# Patient Record
Sex: Female | Born: 1945 | Race: White | Hispanic: No | Marital: Married | Smoking: Former smoker
Health system: Southern US, Community
[De-identification: ages and names within clinical notes are randomized; demographics above are authoritative.]

## PROBLEM LIST (undated history)

## (undated) DIAGNOSIS — B019 Varicella without complication: Secondary | ICD-10-CM

## (undated) DIAGNOSIS — Z124 Encounter for screening for malignant neoplasm of cervix: Secondary | ICD-10-CM

## (undated) DIAGNOSIS — O039 Complete or unspecified spontaneous abortion without complication: Secondary | ICD-10-CM

## (undated) DIAGNOSIS — I10 Essential (primary) hypertension: Secondary | ICD-10-CM

## (undated) DIAGNOSIS — B353 Tinea pedis: Secondary | ICD-10-CM

## (undated) DIAGNOSIS — K219 Gastro-esophageal reflux disease without esophagitis: Secondary | ICD-10-CM

## (undated) DIAGNOSIS — M549 Dorsalgia, unspecified: Secondary | ICD-10-CM

## (undated) DIAGNOSIS — N952 Postmenopausal atrophic vaginitis: Secondary | ICD-10-CM

## (undated) DIAGNOSIS — E7800 Pure hypercholesterolemia, unspecified: Secondary | ICD-10-CM

## (undated) DIAGNOSIS — B351 Tinea unguium: Secondary | ICD-10-CM

## (undated) DIAGNOSIS — L309 Dermatitis, unspecified: Secondary | ICD-10-CM

## (undated) DIAGNOSIS — Z0000 Encounter for general adult medical examination without abnormal findings: Secondary | ICD-10-CM

## (undated) DIAGNOSIS — R05 Cough: Secondary | ICD-10-CM

## (undated) DIAGNOSIS — G4730 Sleep apnea, unspecified: Secondary | ICD-10-CM

## (undated) DIAGNOSIS — R350 Frequency of micturition: Secondary | ICD-10-CM

## (undated) DIAGNOSIS — G4733 Obstructive sleep apnea (adult) (pediatric): Principal | ICD-10-CM

## (undated) DIAGNOSIS — M8580 Other specified disorders of bone density and structure, unspecified site: Principal | ICD-10-CM

## (undated) HISTORY — DX: Postmenopausal atrophic vaginitis: N95.2

## (undated) HISTORY — DX: Pure hypercholesterolemia, unspecified: E78.00

## (undated) HISTORY — DX: Varicella without complication: B01.9

## (undated) HISTORY — DX: Encounter for screening for malignant neoplasm of cervix: Z12.4

## (undated) HISTORY — DX: Essential (primary) hypertension: I10

## (undated) HISTORY — DX: Dorsalgia, unspecified: M54.9

## (undated) HISTORY — DX: Complete or unspecified spontaneous abortion without complication: O03.9

## (undated) HISTORY — DX: Cough: R05

## (undated) HISTORY — DX: Tinea pedis: B35.3

## (undated) HISTORY — DX: Obstructive sleep apnea (adult) (pediatric): G47.33

## (undated) HISTORY — DX: Frequency of micturition: R35.0

## (undated) HISTORY — DX: Other specified disorders of bone density and structure, unspecified site: M85.80

## (undated) HISTORY — DX: Encounter for general adult medical examination without abnormal findings: Z00.00

## (undated) HISTORY — DX: Gastro-esophageal reflux disease without esophagitis: K21.9

## (undated) HISTORY — DX: Sleep apnea, unspecified: G47.30

## (undated) HISTORY — DX: Dermatitis, unspecified: L30.9

## (undated) HISTORY — DX: Tinea unguium: B35.1

---

## 1979-10-17 DIAGNOSIS — O039 Complete or unspecified spontaneous abortion without complication: Secondary | ICD-10-CM

## 1979-10-17 HISTORY — PX: DILATION AND CURETTAGE OF UTERUS: SHX78

## 1979-10-17 HISTORY — DX: Complete or unspecified spontaneous abortion without complication: O03.9

## 1998-09-16 ENCOUNTER — Other Ambulatory Visit: Admission: RE | Admit: 1998-09-16 | Discharge: 1998-09-16 | Payer: Self-pay | Admitting: Obstetrics and Gynecology

## 1999-10-17 DIAGNOSIS — M8580 Other specified disorders of bone density and structure, unspecified site: Secondary | ICD-10-CM

## 1999-10-17 HISTORY — DX: Other specified disorders of bone density and structure, unspecified site: M85.80

## 2000-07-02 ENCOUNTER — Other Ambulatory Visit: Admission: RE | Admit: 2000-07-02 | Discharge: 2000-07-02 | Payer: Self-pay | Admitting: Obstetrics and Gynecology

## 2001-08-06 ENCOUNTER — Other Ambulatory Visit: Admission: RE | Admit: 2001-08-06 | Discharge: 2001-08-06 | Payer: Self-pay | Admitting: Obstetrics and Gynecology

## 2002-10-27 ENCOUNTER — Other Ambulatory Visit: Admission: RE | Admit: 2002-10-27 | Discharge: 2002-10-27 | Payer: Self-pay | Admitting: Obstetrics and Gynecology

## 2003-12-11 ENCOUNTER — Other Ambulatory Visit: Admission: RE | Admit: 2003-12-11 | Discharge: 2003-12-11 | Payer: Self-pay | Admitting: Obstetrics and Gynecology

## 2004-10-28 ENCOUNTER — Other Ambulatory Visit: Admission: RE | Admit: 2004-10-28 | Discharge: 2004-10-28 | Payer: Self-pay | Admitting: Obstetrics and Gynecology

## 2004-11-24 ENCOUNTER — Encounter: Payer: Self-pay | Admitting: Family Medicine

## 2005-01-20 ENCOUNTER — Ambulatory Visit (HOSPITAL_COMMUNITY): Admission: RE | Admit: 2005-01-20 | Discharge: 2005-01-20 | Payer: Self-pay | Admitting: Gastroenterology

## 2005-11-23 ENCOUNTER — Other Ambulatory Visit: Admission: RE | Admit: 2005-11-23 | Discharge: 2005-11-23 | Payer: Self-pay | Admitting: Obstetrics & Gynecology

## 2007-02-01 ENCOUNTER — Other Ambulatory Visit: Admission: RE | Admit: 2007-02-01 | Discharge: 2007-02-01 | Payer: Self-pay | Admitting: Obstetrics & Gynecology

## 2008-03-16 LAB — HM MAMMOGRAPHY: HM Mammogram: NORMAL

## 2008-03-24 ENCOUNTER — Other Ambulatory Visit: Admission: RE | Admit: 2008-03-24 | Discharge: 2008-03-24 | Payer: Self-pay | Admitting: Obstetrics and Gynecology

## 2008-12-17 ENCOUNTER — Encounter: Payer: Self-pay | Admitting: Family Medicine

## 2009-01-11 ENCOUNTER — Ambulatory Visit: Payer: Self-pay | Admitting: Family Medicine

## 2009-01-11 DIAGNOSIS — J019 Acute sinusitis, unspecified: Secondary | ICD-10-CM | POA: Insufficient documentation

## 2009-01-12 ENCOUNTER — Telehealth: Payer: Self-pay | Admitting: Family Medicine

## 2009-04-01 ENCOUNTER — Encounter: Payer: Self-pay | Admitting: Family Medicine

## 2009-04-12 ENCOUNTER — Telehealth: Payer: Self-pay | Admitting: Family Medicine

## 2009-04-12 ENCOUNTER — Encounter: Payer: Self-pay | Admitting: Family Medicine

## 2009-08-16 ENCOUNTER — Encounter (INDEPENDENT_AMBULATORY_CARE_PROVIDER_SITE_OTHER): Payer: Self-pay | Admitting: *Deleted

## 2009-09-07 ENCOUNTER — Encounter: Payer: Self-pay | Admitting: *Deleted

## 2009-12-23 ENCOUNTER — Ambulatory Visit: Payer: Self-pay | Admitting: Family Medicine

## 2009-12-27 ENCOUNTER — Telehealth: Payer: Self-pay | Admitting: Family Medicine

## 2010-04-04 ENCOUNTER — Telehealth: Payer: Self-pay | Admitting: Family Medicine

## 2010-04-13 ENCOUNTER — Ambulatory Visit: Payer: Self-pay | Admitting: Family Medicine

## 2010-04-13 ENCOUNTER — Telehealth: Payer: Self-pay | Admitting: Family Medicine

## 2010-04-13 DIAGNOSIS — Z8619 Personal history of other infectious and parasitic diseases: Secondary | ICD-10-CM | POA: Insufficient documentation

## 2010-04-13 DIAGNOSIS — Z9189 Other specified personal risk factors, not elsewhere classified: Secondary | ICD-10-CM | POA: Insufficient documentation

## 2010-04-13 DIAGNOSIS — M6530 Trigger finger, unspecified finger: Secondary | ICD-10-CM | POA: Insufficient documentation

## 2010-04-13 DIAGNOSIS — M8580 Other specified disorders of bone density and structure, unspecified site: Secondary | ICD-10-CM | POA: Insufficient documentation

## 2010-04-13 DIAGNOSIS — I10 Essential (primary) hypertension: Secondary | ICD-10-CM

## 2010-04-13 DIAGNOSIS — E559 Vitamin D deficiency, unspecified: Secondary | ICD-10-CM | POA: Insufficient documentation

## 2010-04-13 DIAGNOSIS — G5600 Carpal tunnel syndrome, unspecified upper limb: Secondary | ICD-10-CM | POA: Insufficient documentation

## 2010-04-13 HISTORY — DX: Essential (primary) hypertension: I10

## 2010-04-13 LAB — CONVERTED CEMR LAB
Albumin: 4 g/dL (ref 3.5–5.2)
Basophils Absolute: 0 10*3/uL (ref 0.0–0.1)
Bilirubin, Direct: 0.2 mg/dL (ref 0.0–0.3)
Chloride: 108 meq/L (ref 96–112)
Cholesterol: 222 mg/dL — ABNORMAL HIGH (ref 0–200)
Direct LDL: 140.3 mg/dL
Eosinophils Absolute: 0.1 10*3/uL (ref 0.0–0.7)
Eosinophils Relative: 2.1 % (ref 0.0–5.0)
HDL: 54.3 mg/dL (ref >39.00)
MCHC: 33.8 g/dL (ref 30.0–36.0)
MCV: 96.5 fL (ref 78.0–100.0)
Monocytes Absolute: 0.4 10*3/uL (ref 0.1–1.0)
Neutrophils Relative %: 62.5 % (ref 43.0–77.0)
Phosphorus: 4.7 mg/dL — ABNORMAL HIGH (ref 2.3–4.6)
Platelets: 320 10*3/uL (ref 150.0–400.0)
Potassium: 6.3 meq/L (ref 3.5–5.1)
RDW: 14.7 % — ABNORMAL HIGH (ref 11.5–14.6)
Total Bilirubin: 0.8 mg/dL (ref 0.3–1.2)
Triglycerides: 71 mg/dL (ref 0.0–149.0)
Vit D, 25-Hydroxy: 57 ng/mL (ref 30–89)
WBC: 5.2 10*3/uL (ref 4.5–10.5)

## 2010-04-14 ENCOUNTER — Ambulatory Visit: Payer: Self-pay | Admitting: Family Medicine

## 2010-04-15 LAB — CONVERTED CEMR LAB: Potassium: 4.3 meq/L (ref 3.5–5.1)

## 2010-05-11 ENCOUNTER — Ambulatory Visit: Payer: Self-pay | Admitting: Family Medicine

## 2010-05-11 DIAGNOSIS — E782 Mixed hyperlipidemia: Secondary | ICD-10-CM | POA: Insufficient documentation

## 2010-05-25 ENCOUNTER — Encounter: Payer: Self-pay | Admitting: Family Medicine

## 2010-07-04 ENCOUNTER — Telehealth: Payer: Self-pay | Admitting: Family Medicine

## 2010-08-29 ENCOUNTER — Telehealth: Payer: Self-pay | Admitting: Family Medicine

## 2010-10-21 ENCOUNTER — Telehealth: Payer: Self-pay | Admitting: Family Medicine

## 2010-11-15 NOTE — Progress Notes (Signed)
Summary: Transfer care request  Phone Note Call from Patient Call back at Home Phone [phone]   Caller: Patient Call For: Evelena Peat MD Summary of Call: Pt calling to discuss her options at [facility].  Pt turned 64 and would like to transition from OB/GYN to a female PCP.  Pt loves Dr Caryl Never, however would feel more comfortable with a female for complete physicals.  Informed of new female provider here, Dr Abner Greenspan who is accepting new patients, however Dr Abner Greenspan will be moving to [facility] eventually.  Pt voiced her understanding and would like to proceed with proper transfer to Dr Abner Greenspan. Initial call taken by: Sid Falcon LPN,  April 04, 2010 10:33 AM  Follow-up for Phone Call        OK to transfer. Follow-up by: Evelena Peat MD,  April 04, 2010 12:16 PM  Additional Follow-up for Phone Call Additional follow up Details #1::        Please call pt to schedule appt type and date with Dr Abner Greenspan. Thank you Additional Follow-up by: Sid Falcon LPN,  April 04, 2010 2:56 PM    Additional Follow-up for Phone Call Additional follow up Details #2::    I called and lft a vm for pt to call back.   Follow-up by: Lucy Antigua,  April 04, 2010 3:36 PM  Additional Follow-up for Phone Call Additional follow up Details #3:: Details for Additional Follow-up Action Taken: Contacted pt and appt was scheduled with Dr Abner Greenspan on April 13, 2010 at 8:15am.... Pt will come in fasting for potential labwork... Pt understands this is an est appt with Dr Danise Edge.  Additional Follow-up by: Debbra Riding,  April 04, 2010 3:47 PM

## 2010-11-15 NOTE — Progress Notes (Signed)
Summary: Pt req refill of Vagifem 2 x week  Phone Note Call from Patient Call back at Home Phone [phone]   Refills Requested: Medication #1:  VAGIFEM 25 MCG TABS 2 X week Summary of Call: Pt called and is req refill of Vagifem 2 x week. Pls call in to [facility] [phone].  Pt says that the PA over at [facility], decrease dose from to .  Initial call taken by: Lucy Antigua,  July 04, 2010 10:14 AM  Follow-up for Phone Call        OK to refill use sig of pv twice weekly, Disp 1 # tube, 1 rf, needs women's health notes before I can rx more. She signed a release but it does not look like it is here yet. Follow-up by: Danise Edge MD,  July 04, 2010 12:29 PM    New/Updated Medications: VAGIFEM 10 MCG TABS (ESTRADIOL) take 1 twice a week Prescriptions: VAGIFEM 10 MCG TABS (ESTRADIOL) take 1 twice a week  #8 x 1   Entered by:   Josph Macho RMA   Authorized by:   Danise Edge MD   Signed by:   Josph Macho RMA on 07/04/2010   Method used:   Electronically to        [facility]* (retail)       [address]       [place], [place]  [id number]       Ph: [phone]       Fax: [phone]   RxID:   [id number]

## 2010-11-15 NOTE — Progress Notes (Signed)
Summary: Rx Vagifem  Phone Note Outgoing Call   Call placed by: Kathrynn Speed CMA,  April 13, 2010 2:16 PM Call placed to: Patient Summary of Call: Lft msg for pt to call office. Gwenn informed me of critical lab report of high K ( 6.3) informed Dr. Abner Greenspan she wants pt to drink lots of water & low K diet and follow up with in the morning to repeat the lab............................................................Marland Kitchensrc,cma..............04/13/10 ....................... Initial call taken by: Kathrynn Speed CMA,  April 13, 2010 2:21 PM  Follow-up for Phone Call        Pt is aware of lab results & will fu in the am to repeat the lab test.  Also, Pt want to make sure it is ok to continue useing Vagifem since she will not see an obgyn, if ok can you refill?  Follow-up by: Kathrynn Speed CMA,  April 13, 2010 3:29 PM  Additional Follow-up for Phone Call Additional follow up Details #1::        OK to cont Vagifem, rx sent to her pharmacy already Danise Edge MD  April 13, 2010 5:02 PM................Marland Kitchen        Additional Follow-up for Phone Call Additional follow up Details #2::    Lft msg that ok to continue med & rx sent to pharmacy Follow-up by: Kathrynn Speed CMA,  April 13, 2010 5:04 PM  Prescriptions: VAGIFEM 25 MCG TABS (ESTRADIOL) 2 X week  #8 x 3   Entered and Authorized by:   Danise Edge MD   Signed by:   Danise Edge MD on 04/13/2010   Method used:   Electronically to        [facility] (retail)       [address]       [place], [place]  [id number]       Ph: [phone]       Fax: [phone]   RxID:   [id number]

## 2010-11-15 NOTE — Progress Notes (Signed)
Summary: Vagifem refill  Phone Note Refill Request Message from:  Fax from Pharmacy on August 29, 2010 11:25 AM  Refills Requested: Medication #1:  VAGIFEM 10 MCG TABS take 1 twice a week   Dosage confirmed as above?Dosage Confirmed Please advise refill?  Initial call taken by: Josph Macho RMA,  August 29, 2010 11:25 AM  Follow-up for Phone Call        OK to refill with same sig, #8 and 1 rf Follow-up by: Danise Edge MD,  August 29, 2010 11:28 AM    Prescriptions: VAGIFEM 10 MCG TABS (ESTRADIOL) take 1 twice a week  #8 x 1   Entered by:   Josph Macho RMA   Authorized by:   Danise Edge MD   Signed by:   Josph Macho RMA on 08/29/2010   Method used:   Electronically to        [facility]* (retail)       [address]       [place], [place]  [id number]       Ph: [phone]       Fax: [phone]   RxID:   [id number]

## 2010-11-15 NOTE — Letter (Signed)
Summary: Records from [facility] 2009 - 2010  Records from [facility] 2009 - 2010   Imported By: Maryln Gottron 05/26/2010 09:40:45  _____________________________________________________________________  External Attachment:    Type:   Image     Comment:   External Document

## 2010-11-15 NOTE — Assessment & Plan Note (Signed)
Summary: Mandy [name] EST WITH DR Deniyah Dillavou - [place] PER DR Caryl Never // RS   Vital Signs:  Patient profile:   65 year old female Menstrual status:  postmenopausal Height:      62.5 inches Weight:      160 pounds BMI:     28.90 O2 Sat:      93 % on Room air Temp:     97.7 degrees F oral Pulse rate:   65 / minute BP sitting:   140 / 100  (left arm) Cuff size:   regular  Vitals Entered By: Kathrynn Speed CMA (April 13, 2010 8:16 AM)  O2 Flow:  Room air CC: Cpx / fasting / src     Menstrual Status postmenopausal   History of Present Illness: Patient in for new patient exam today doing very well. No recent illness or concerns. No CP/palp/SOB/GI or GU concerns. Does struggle with some pain in right hand at times but is doing well at present. 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  Preventive Screening-Counseling & Management  Caffeine-Diet-Exercise     Does Patient Exercise: yes      Drug Use:  no.    Current Medications (verified): 1)  Vagifem 25 Mcg Tabs (Estradiol) .... 2 X Week 2)  Calcium Carbonate 1500 Mg Tabs (Calcium Carbonate) .... Once A Day 3)  Sm Calcium Soft Chews 500-100-40 Chew (Calcium-Vitamin D-Vitamin K) .... Once A Day  Allergies (verified): 1)  ! Pneumovax 23 (Pneumococcal Vac Polyvalent) 2)  ! Codeine  Comments:  Nurse/Medical Assistant: The patient's medications were reviewed with the patient and were updated in the Medication List. Kathrynn Speed CMA (April 13, 2010 8:36 AM)  Past History:  Past Surgical History: D&C for incomplete miscarriage  Family History: Family History of Arthritis Family History High cholesterol Family History Hypertension Diabetes, parent Family History of Cardiovascular disorder Father: deceased @ 80, DMII, Heart Disease, Stroke Mother: deceased @95 , osteoporosis, CAD s/p MI in 81's, HTN Siblings:  Brother 77,  unknown history MGM: deceased @ 25, Dementia MGF: deceased @ 37, Asthma PGM: deceased @ 91, Ovarian CA PGF: deceased @70 , DMII, probable heart disease Children: Daughter: 92, [employer]  Social History: 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 Smoked Alcohol use-no Drug use-no Regular exercise-yes does Yoga 3-4 x wk, Systems analyst 1 x wk, and walks regularly Has lost 16 pounds by minimizing processed foods and meatsDrug Use:  no Does Patient Exercise:  yes  Review of Systems  The patient denies anorexia, fever, weight loss, weight gain, vision loss, decreased hearing, hoarseness, chest pain, syncope, dyspnea on exertion, peripheral edema, prolonged cough, headaches, hemoptysis, abdominal pain, melena, hematochezia, severe indigestion/heartburn, hematuria, incontinence, genital sores, muscle weakness, suspicious skin lesions, difficulty walking, depression, unusual weight change, and breast masses.    Physical Exam  General:  Well-developed,well-nourished,in no acute distress; alert,appropriate and cooperative throughout examination Head:  Normocephalic and atraumatic without obvious abnormalities Eyes:  No corneal or conjunctival inflammation noted. EOMI. Perrla.  Ears:  External ear exam shows no significant lesions or deformities.  Otoscopic examination reveals clear canals, tympanic membranes are intact bilaterally without bulging, retraction, inflammation or discharge. Hearing is grossly normal bilaterally. Nose:  External nasal examination shows no deformity or inflammation. Nasal mucosa are pink and moist without lesions or exudates. Mouth:  Oral mucosa and oropharynx without lesions or exudates.  Teeth in good repair. Neck:  No deformities, masses, or tenderness noted. Lungs:  Normal respiratory effort, chest expands symmetrically. Lungs are clear to auscultation, no crackles or wheezes. Heart:  Normal rate and regular rhythm. S1 and S2 normal without gallop, murmur, click, rub or  other extra sounds. Abdomen:  Bowel sounds positive,abdomen soft and non-tender without masses, organomegaly or hernias noted. Msk:  No deformity or scoliosis noted of thoracic or lumbar spine.   Pulses:  R and L carotid, dorsalis pedis and posterior tibial pulses are full and equal bilaterally Extremities:  No clubbing, cyanosis, edema, or deformity noted with normal full range of motion of all joints.   Neurologic:  No cranial nerve deficits noted. Station and gait are normal. Plantar reflexes are down-going bilaterally. DTRs are symmetrical throughout. Sensory, motor and coordinative functions appear intact. Skin:  Intact without suspicious lesions or rashes Cervical Nodes:  No lymphadenopathy noted Psych:  Cognition and judgment appear intact. Alert and cooperative with normal attention span and concentration. 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 Once a day    Sm Calcium Soft Chews 500-100-40 Chew (Calcium-vitamin d-vitamin k) ..... Once a day  Orders: TLB-Renal Function Panel (80069-RENAL) TLB-Lipid Panel (80061-LIPID) TLB-CBC Platelet - w/Differential (85025-CBCD) TLB-Hepatic/Liver Function Pnl (80076-HEPATIC) TLB-TSH (Thyroid Stimulating Hormone)  (84443-TSH) T-Vitamin D (25-Hydroxy) (60454-09811) Venipuncture (91478) Maintain Viactiv and Vitamin D until results available Consider repeat bone densitometry  Problem # 4:  PREVENTIVE HEALTH CARE (ICD-V70.0) Patient had Pap and MGM last year would like to wait til next year to repeat. Colonoscopy in 2008 was clear so need for repeat until 2018 unless symtpoms develop.  Complete Medication List: 1)  Vagifem 25 Mcg Tabs (Estradiol) .... 2 x week 2)  Calcium Carbonate 1500 Mg Tabs (Calcium carbonate) .... Once a day 3)  Sm Calcium Soft Chews 500-100-40 Chew (Calcium-vitamin d-vitamin k) .... Once a day  Patient Instructions: 1)  Please schedule a follow-up appointment in 1 month. Call with any concerns 2)  Continue Viactiv three times a day and we will notify you regarding your Vitamin D level  Prevention & Chronic Care Immunizations   Influenza vaccine: Not documented    Tetanus booster: Not documented    Pneumococcal vaccine: Pneumovax  (12/23/2009)    H. zoster vaccine: 09/23/2008: Zostavax  Colorectal Screening   Hemoccult: Not documented    Colonoscopy: normal  (10/16/2000)  Other Screening   Pap smear: Not documented    Mammogram: normal  (03/16/2008)    DXA bone density scan: Not documented   Smoking status: never  (01/11/2009)  Lipids   Total Cholesterol: Not documented   LDL: Not documented   LDL Direct: Not documented   HDL: Not documented   Triglycerides: Not documented

## 2010-11-15 NOTE — Progress Notes (Signed)
Summary: allergic reaction to pneumonia  Phone Note Call from Patient   Caller: Patient Call For: Evelena Peat MD Summary of Call: Pt. is complaining of allergic reaction at site of pneumonia immuninzation.  Red and painful whelp (5 inches in diameter). [phone] Initial call taken by: Lynann Beaver CMA,  December 27, 2009 10:23 AM  Follow-up for Phone Call        spoke with patient.  She will try Zyrtec and office visit to evaluate if not improving next couple of days.  She denies any fever or chills. Follow-up by: Evelena Peat MD,  December 27, 2009 12:49 PM   New Allergies: ! PNEUMOVAX 23 (PNEUMOCOCCAL VAC POLYVALENT) New Allergies: ! PNEUMOVAX 23 (PNEUMOCOCCAL VAC POLYVALENT)

## 2010-11-15 NOTE — Assessment & Plan Note (Signed)
Summary: PNEUMONIA INJ // RS  Nurse Visit   Immunizations Administered:  Pneumonia Vaccine:    Vaccine Type: Pneumovax    Site: left deltoid    Mfr: Merck    Dose: 0.5 ml    Route: IM    Given by: Sid Falcon LPN    Exp. Date: 02/03/2011    Lot #: [id number]  Orders Added: 1)  Pneumococcal Vaccine [90732] 2)  Admin 1st Vaccine [60454]

## 2010-11-15 NOTE — Letter (Signed)
Summary: Records from [facility] 2009 - 2010  Records from [facility] 2009 - 2010   Imported By: Maryln Gottron 05/19/2010 14:44:49  _____________________________________________________________________  External Attachment:    Type:   Image     Comment:   External Document

## 2010-11-15 NOTE — Assessment & Plan Note (Signed)
Summary: 1 month fup//ccm   Vital Signs:  Patient profile:   65 year old female Menstrual status:  postmenopausal Height:      62.5 inches (158.75 cm) Weight:      158 pounds (71.82 kg) O2 Sat:      96 % on Room air Temp:     97.8 degrees F (36.56 degrees C) oral Pulse rate:   71 / minute BP sitting:   122 / 82  (left arm) Cuff size:   regular  Vitals Entered By: Josph Macho RMA (May 11, 2010 9:23 AM)  O2 Flow:  Room air CC: 1 month follow up/ CF Is Patient Diabetic? No   History of Present Illness: Patient in today for f/u on labs and her initial appt. Doing very well, no recent illness, fevers, chills, CP, HA, malaise, SOB, GI or GU c/o. Her K was up with initial blood draw but normal on repeat, she denies any muscle cramping or palpitation.  Current Problems (verified): 1)  Mumps, Hx of  (ICD-V12.09) 2)  Chickenpox, Hx of  (ICD-V15.9) 3)  Trigger Finger, Right Thumb  (ICD-727.03) 4)  Carpal Tunnel Syndrome, Right, Mild  (ICD-354.0) 5)  Elevated Bp Reading Without Dx Hypertension  (ICD-796.2) 6)  Osteopenia  (ICD-733.90) 7)  Unspecified Vitamin D Deficiency  (ICD-268.9) 8)  Preventive Health Care  (ICD-V70.0) 9)  Facial Pain  (ICD-784.0)  Current Medications (verified): 1)  Vagifem 25 Mcg Tabs (Estradiol) .... 2 X Week 2)  Calcium Carbonate 1500 Mg Tabs (Calcium Carbonate) .... Once A Day 3)  Sm Calcium Soft Chews 500-100-40 Chew (Calcium-Vitamin D-Vitamin K) .... Once A Day  Allergies (verified): 1)  ! Pneumovax 23 (Pneumococcal Vac Polyvalent) 2)  ! Codeine  Past History:  Past Surgical History: Last updated: 04/13/2010 D&C for incomplete miscarriage  Family History: Last updated: 04/13/2010 Family History of Arthritis Family History High cholesterol Family History Hypertension Diabetes, parent Family History of Cardiovascular disorder Father: deceased @ 75, DMII, Heart Disease, Stroke Mother: deceased @95 , osteoporosis, CAD s/p MI in 71's,  HTN Siblings:  Brother 46, unknown history MGM: deceased @ 14, Dementia MGF: deceased @ 86, Asthma PGM: deceased @ 55, Ovarian CA PGF: deceased @70 , DMII, probable heart disease Children: Daughter: 43, [employer]  Social History: Last updated: 04/13/2010 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 Social history (including risk factors) reviewed for relevance to current acute and chronic problems.  Past Medical History: Reviewed history from 01/11/2009 and no changes required. Childbirth 68 D & C Miscarriage 36  Social History: Reviewed history from 04/13/2010 and no changes required. 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 EOMI. Perrla. Funduscopic exam benign, without hemorrhages, exudates or papilledema. Vision grossly normal. Mouth:  Oral mucosa and oropharynx without lesions or exudates.  Teeth in  good repair. Neck:  No deformities, masses, or tenderness noted. Lungs:  Normal respiratory effort, chest expands symmetrically. Lungs are clear to auscultation, no crackles or wheezes. Heart:  Normal rate and regular rhythm. S1 and S2 normal without gallop, murmur,  click, rub or other extra sounds. Abdomen:  Bowel sounds positive,abdomen soft and non-tender without masses, organomegaly or hernias noted. Msk:  No deformity or scoliosis noted of thoracic or lumbar spine.   Extremities:  No clubbing, cyanosis, edema, or deformity noted with normal full range of motion of all joints.   Psych:  Cognition and judgment appear intact. Alert and cooperative with normal attention span and concentration. No apparent delusions, illusions, hallucinations   Impression & Recommendations:  Problem # 1:  MIXED HYPERLIPIDEMIA (ICD-272.2) Avoid trans fats maintain daily exercise regimen, start a fish oil supplement daily and cont to monitor  Problem # 2:  ELEVATED BP READING WITHOUT DX HYPERTENSION (ICD-796.2) Improved on repeat today, avoid sodium and report any concerning numbers or symptoms  Problem # 3:  OSTEOPENIA (ICD-733.90)  Her updated medication list for this problem includes:    Calcium Carbonate 1500 Mg Tabs (Calcium carbonate) ..... Once a day    Sm Calcium Soft Chews 500-100-40 Chew (Calcium-vitamin d-vitamin k) ..... Once a day Request records from previous doctors she believes her last bone study was 6/10, cont calcium supplement and repeat bone study when due  Problem # 4:  PREVENTIVE HEALTH CARE (ICD-V70.0) MGM ordered today baseline EKG today Td given reports taking Zostavax and Pneumovax in last couple of years, will request records  Complete Medication List: 1)  Vagifem 25 Mcg Tabs (Estradiol) .... 2 x week 2)  Calcium Carbonate 1500 Mg Tabs (Calcium carbonate) .... Once a day 3)  Sm Calcium Soft Chews 500-100-40 Chew (Calcium-vitamin d-vitamin k) .... Once a day  Patient Instructions: 1)  Please schedule a follow-up appointment in 1 year.  2)  Please return for lab work one(1) week before your next appointment.  3)  BMP prior to visit, ICD-9: 278.02 4)  Hepatic Panel prior to visit ICD-9:  5)  Lipid panel prior to visit ICD-9 :272.0    6)  TSH prior to visit ICD-9 : 278.02 7)  CBC w/ Diff prior to visit ICD-9 : 796. 8)  Vitamin D level:733.92 9)  Release of Records: Mauricia Area, NP at [facility], need MGMs, Paps, colonoscopy and Bone Density results 10)  [facility] need labs and immunizations  Appended Document: Orders Update    Clinical Lists Changes  Orders: Added new Service order of EKG w/ Interpretation (93000) - Signed Added new Service order of TD Toxoids IM 7 YR + (57322) - Signed Added new Service order of Admin 1st Vaccine (02542) - Signed Observations: Added new observation of TD BOOST VIS: 09/03/07 version given May 11, 2010. (05/11/2010 10:08) Added new observation of TD BOOSTERLO: [id number] (05/11/2010 10:08) Added new observation of TD BOOST EXP: 11/11/2011 (05/11/2010 10:08) Added new observation of TD BOOSTERBY: Josph Macho RMA (05/11/2010 10:08) Added new observation of TD BOOSTERRT: IM (05/11/2010 10:08) Added new observation of TDBOOSTERDSE: 0.5 ml (05/11/2010 10:08) Added new observation of TD BOOSTERMF: Sanofi Pasteur (05/11/2010 10:08) Added new observation of TD BOOST SIT: left deltoid (05/11/2010 10:08) Added new observation of TD BOOSTER: Td (05/11/2010 10:08)       Immunizations Administered:  Tetanus Vaccine:    Vaccine Type: Td    Site: left deltoid    Mfr: Sanofi Pasteur    Dose: 0.5  ml    Route: IM    Given by: Josph Macho RMA    Exp. Date: 11/11/2011    Lot #: [id number]    VIS given: 09/03/07 version given May 11, 2010.

## 2010-11-17 NOTE — Progress Notes (Signed)
Summary: Vagifem refill  Phone Note Refill Request Message from:  Fax from Pharmacy on October 21, 2010 4:33 PM  Refills Requested: Medication #1:  VAGIFEM 10 MCG TABS take 1 twice a week   Dosage confirmed as above?Dosage Confirmed This med needs to be sent to [facility]  Initial call taken by: Josph Macho RMA,  October 21, 2010 4:33 PM    Prescriptions: VAGIFEM 10 MCG TABS (ESTRADIOL) take 1 twice a week  #3 x 3   Entered by:   Josph Macho RMA   Authorized by:   Danise Edge MD   Signed by:   Josph Macho RMA on 10/21/2010   Method used:   Faxed to ...       [facility] (mail-order)             , [place]         Ph: [phone]       Fax: [phone]   RxID:   [id number]

## 2011-03-03 NOTE — Op Note (Signed)
NAME:  Mandy [name], Mandy [name]                  ACCOUNT NO.:  [id number]   MEDICAL RECORD NO.:  [id number]          PATIENT TYPE:  AMB   LOCATION:  ENDO                         FACILITY:  [facility]   PHYSICIAN:  Anselmo Rod, M.D.  DATE OF BIRTH:  1945-10-23   DATE OF PROCEDURE:  01/20/2005  DATE OF DISCHARGE:                                 OPERATIVE REPORT   PROCEDURE PERFORMED:  Screening colonoscopy.   ENDOSCOPIST:  Anselmo Rod, M.D.   INSTRUMENT USED:  Olympus video colonoscope.   INDICATION FOR PROCEDURE:  A 65 year old white female underwent screening  colonoscopy, rule out colonic polyps, masses, etc.   PREPROCEDURE PREPARATION:  Informed consent was procured from the patient.  The patient was fasted for eight hours prior to the procedure and prepped  with a bottle of magnesium citrate and a gallon of GoLYTELY the night prior  to the procedure.  The risks and benefits of the procedure including a 10%  miss rate of cancer and polyps were discussed with the patient, as well.   PREPROCEDURE PHYSICAL:  The patient had stable vital signs.  Neck supple.  Chest clear to auscultation.  S1, S2 regular.  Abdomen soft with normal  bowel sounds.   DESCRIPTION OF PROCEDURE:  The patient was placed in the left lateral  decubitus position, sedated with 70 mg of Demerol and 10 mg of Versed in  slow incremental doses.  Once the patient was adequately sedated, and  maintained on low flow oxygen and continuous cardiac monitoring, the Olympus  video colonoscope was advanced from the rectum to the cecum. The appendical  orifice and ileocecal valve were clearly visualized and photographed.  The  terminal ileum appeared healthy and without lesions.  No masses, polyps,  erosions, ulcerations, or diverticula were seen.  Retroflexion of the rectum  revealed small internal hemorrhoids.  The patient tolerated the procedure  well without complications.   IMPRESSION:  1.  Small nonbleeding internal  hemorrhoids.  2. Otherwise normal colonoscopy      to the terminal ileum.   RECOMMENDATIONS:  1.  Continue a high fiber diet with liberal fluid intake.  2. Outpatient      follow-up as need arises in the future.  3. Repeat colonoscopy in the      next five years unless the patient develops any abnormal symptoms in the      interim.      JNM/MEDQ  D:  01/20/2005  T:  01/20/2005  Job:  [id number]   cc:   Andres Ege, M.D.  [address]  [place]  [place] [address]  Fax: [phone]   Teena Irani. Arlyce Dice, M.D.  [address]  [place]  [place] [address]  Fax: [phone]

## 2011-04-10 ENCOUNTER — Other Ambulatory Visit: Payer: Self-pay

## 2011-04-10 ENCOUNTER — Other Ambulatory Visit (INDEPENDENT_AMBULATORY_CARE_PROVIDER_SITE_OTHER): Payer: Medicare Other

## 2011-04-10 DIAGNOSIS — Z0000 Encounter for general adult medical examination without abnormal findings: Secondary | ICD-10-CM

## 2011-04-10 DIAGNOSIS — E7800 Pure hypercholesterolemia, unspecified: Secondary | ICD-10-CM

## 2011-04-10 DIAGNOSIS — E663 Overweight: Secondary | ICD-10-CM

## 2011-04-10 DIAGNOSIS — R030 Elevated blood-pressure reading, without diagnosis of hypertension: Secondary | ICD-10-CM

## 2011-04-10 LAB — CBC WITH DIFFERENTIAL/PLATELET
Basophils Relative: 1.5 % (ref 0.0–3.0)
Eosinophils Relative: 4.4 % (ref 0.0–5.0)
HCT: 39.2 % (ref 36.0–46.0)
Lymphs Abs: 1.3 10*3/uL (ref 0.7–4.0)
MCV: 96.4 fl (ref 78.0–100.0)
Monocytes Absolute: 0.4 10*3/uL (ref 0.1–1.0)
Monocytes Relative: 10.8 % (ref 3.0–12.0)
Neutrophils Relative %: 48.9 % (ref 43.0–77.0)
RBC: 4.07 Mil/uL (ref 3.87–5.11)
WBC: 3.7 10*3/uL — ABNORMAL LOW (ref 4.5–10.5)

## 2011-04-10 LAB — HEPATIC FUNCTION PANEL
ALT: 14 U/L (ref 0–35)
AST: 19 U/L (ref 0–37)
Bilirubin, Direct: 0.2 mg/dL (ref 0.0–0.3)
Total Bilirubin: 1.3 mg/dL — ABNORMAL HIGH (ref 0.3–1.2)
Total Protein: 6.3 g/dL (ref 6.0–8.3)

## 2011-04-10 LAB — BASIC METABOLIC PANEL
Chloride: 105 mEq/L (ref 96–112)
GFR: 58.91 mL/min — ABNORMAL LOW (ref >60.00)
Potassium: 4.5 mEq/L (ref 3.5–5.1)
Sodium: 140 mEq/L (ref 135–145)

## 2011-04-10 LAB — POCT URINALYSIS DIPSTICK
Ketones, UA: NEGATIVE
Protein, UA: NEGATIVE
Spec Grav, UA: 1
Urobilinogen, UA: 0.2
pH, UA: 7.5

## 2011-04-11 LAB — LIPID PANEL
Cholesterol: 257 mg/dL — ABNORMAL HIGH (ref 0–200)
VLDL: 12 mg/dL (ref 0.0–40.0)

## 2011-04-17 ENCOUNTER — Encounter: Payer: Self-pay | Admitting: Family Medicine

## 2011-04-20 ENCOUNTER — Encounter: Payer: Self-pay | Admitting: Family Medicine

## 2011-04-24 ENCOUNTER — Encounter: Payer: Self-pay | Admitting: Family Medicine

## 2011-04-26 ENCOUNTER — Ambulatory Visit (INDEPENDENT_AMBULATORY_CARE_PROVIDER_SITE_OTHER): Payer: Medicare Other | Admitting: Family Medicine

## 2011-04-26 ENCOUNTER — Encounter: Payer: Self-pay | Admitting: Family Medicine

## 2011-04-26 ENCOUNTER — Other Ambulatory Visit (HOSPITAL_COMMUNITY)
Admission: RE | Admit: 2011-04-26 | Discharge: 2011-04-26 | Disposition: A | Discharge status: Home / Self Care | Payer: Medicare Other | Source: Ambulatory Visit | Attending: Family Medicine | Admitting: Family Medicine

## 2011-04-26 ENCOUNTER — Other Ambulatory Visit: Payer: Self-pay

## 2011-04-26 ENCOUNTER — Telehealth: Payer: Self-pay

## 2011-04-26 DIAGNOSIS — L309 Dermatitis, unspecified: Secondary | ICD-10-CM | POA: Insufficient documentation

## 2011-04-26 DIAGNOSIS — M899 Disorder of bone, unspecified: Secondary | ICD-10-CM

## 2011-04-26 DIAGNOSIS — Z01419 Encounter for gynecological examination (general) (routine) without abnormal findings: Secondary | ICD-10-CM | POA: Insufficient documentation

## 2011-04-26 DIAGNOSIS — B351 Tinea unguium: Secondary | ICD-10-CM

## 2011-04-26 DIAGNOSIS — M8580 Other specified disorders of bone density and structure, unspecified site: Secondary | ICD-10-CM

## 2011-04-26 DIAGNOSIS — Z124 Encounter for screening for malignant neoplasm of cervix: Secondary | ICD-10-CM

## 2011-04-26 DIAGNOSIS — E785 Hyperlipidemia, unspecified: Secondary | ICD-10-CM

## 2011-04-26 DIAGNOSIS — M949 Disorder of cartilage, unspecified: Secondary | ICD-10-CM

## 2011-04-26 DIAGNOSIS — E559 Vitamin D deficiency, unspecified: Secondary | ICD-10-CM

## 2011-04-26 DIAGNOSIS — L259 Unspecified contact dermatitis, unspecified cause: Secondary | ICD-10-CM

## 2011-04-26 DIAGNOSIS — E782 Mixed hyperlipidemia: Secondary | ICD-10-CM

## 2011-04-26 DIAGNOSIS — R030 Elevated blood-pressure reading, without diagnosis of hypertension: Secondary | ICD-10-CM

## 2011-04-26 HISTORY — DX: Tinea unguium: B35.1

## 2011-04-26 HISTORY — DX: Encounter for screening for malignant neoplasm of cervix: Z12.4

## 2011-04-26 HISTORY — DX: Dermatitis, unspecified: L30.9

## 2011-04-26 MED ORDER — ESTRADIOL 10 MCG VA TABS: 1.0000 | ORAL_TABLET | VAGINAL | Status: DC

## 2011-04-26 MED ORDER — TRIAMCINOLONE ACETONIDE 0.1 % EX OINT: TOPICAL_OINTMENT | CUTANEOUS | Status: DC

## 2011-04-26 NOTE — Telephone Encounter (Signed)
Pt would also like an order for Vitamin D sent to [facility]?

## 2011-04-26 NOTE — Assessment & Plan Note (Signed)
Has not had a level checked since June 2011, will repeat level, patient will continue her Viactiv chews tid for now

## 2011-04-26 NOTE — Telephone Encounter (Signed)
Per md order was placed

## 2011-04-26 NOTE — Assessment & Plan Note (Signed)
B/l toenails, great toes are the worst. Soak nightly in distilled white vinegar and water and then coat with Vick's Vapor rub.

## 2011-04-26 NOTE — Assessment & Plan Note (Addendum)
Pap smear taken today, encouraged paps roughly every 3 years or as needed and MGMs every 2 years. Patient may titrate off Vagifem as tolerated at her discretion

## 2011-04-26 NOTE — Telephone Encounter (Signed)
Please notify patient the computer still is not letting me order the Vitamin D level. I have our computer team working on the problem and they promise it will be straight by Monday so she can get everything drawn at once

## 2011-04-26 NOTE — Assessment & Plan Note (Signed)
Improved control, will continue to monitor

## 2011-04-26 NOTE — Patient Instructions (Signed)

## 2011-04-26 NOTE — Assessment & Plan Note (Signed)
Patient total cholesterol is up so she is agreeing to have an NMR drawn and she will continue to try and avoid trans and saturated fats, Add Metamucil daily

## 2011-04-26 NOTE — Assessment & Plan Note (Signed)
Patient exercises regularly is encouraged to continue the same. Last Bone Densitometry was 2 years ago, encouraged to have a repeat some time in the next year

## 2011-04-26 NOTE — Assessment & Plan Note (Signed)
Hands, try Triamcinolone ointment to apply in thin coat to hands qhs and cover with moist cotton gloves

## 2011-04-26 NOTE — Progress Notes (Signed)
Mandy [name] [id number] 05/16/1946 04/26/2011      Progress Note New Patient  Subjective  Chief Complaint  Chief Complaint  Patient presents with  . Annual Exam    physical  . Gynecologic Exam    pap smear    HPI  Patient is a 65 yo Caucasian female in today for annual GYN exam. She is generally doing well. See review of systems for GYN history. She denies any GYN complaints at this time. Has not ever tried titrating off Vagifem but is willing to try now. Is concerned about dry cracked hands. She works in a garden a lot has tried applying Vaseline and gloves at night but that has not been helpful. He can become painful at times. Number warmth and redness associated. Just with thickened toenails. In the past she's had trouble onychomycosis and actually took a course of Lamisil which did help but took roughly 6 months. She denies any recent illness, fevers, chills, chest pain, palpitations, shortness of breath, GI or GU complaints. She is exercising regularly and keeping active.  Past Medical History  Diagnosis Date  . Osteopenia 10/17/1999  . Chicken pox as a child  . Measles as a child  . Miscarriage 1981  . Screening for malignant neoplasm of the cervix 04/26/2011  . Dermatitis 04/26/2011  . Onychomycosis 04/26/2011    Past Surgical History  Procedure Date  . Dilation and curettage of uterus 1981    miscarriage    Family History  Problem Relation Age of Onset  . Osteoporosis Mother   . Coronary artery disease Mother   . Heart attack Mother 78  . Hypertension Mother   . Diabetes Father     type 2  . Heart disease Father   . Stroke Father   . Dementia Maternal Grandmother   . Asthma Maternal Grandfather   . Cancer Paternal Grandmother     ovarian  . Diabetes Paternal Grandfather     tyoe 2  . Hearing loss Paternal Grandfather   . Arthritis Other   . Hyperlipidemia Other   . Hypertension Other   . Other Other     Cardiovascular disorder    History   Social  History  . Marital Status: Married    Spouse Name: N/A    Number of Children: N/A  . Years of Education: N/A   Occupational History  . bookkeeper    Social History Main Topics  . Smoking status: Former Smoker -- 1.0 packs/day    Types: Cigarettes    Quit date: 10/16/2000  . Smokeless tobacco: Never Used  . Alcohol Use: No  . Drug Use: No  . Sexually Active: Yes -- Female partner(s)   Other Topics Concern  . Not on file   Social History Narrative   Regular exercise- yesDoes Yoga 3-4 X week, personal trainer 1 X week, and walks regularlyHas lost 16 pounds by minimizing processed foods and meats    Current Outpatient Prescriptions on File Prior to Visit  Medication Sig Dispense Refill  . Calcium-Vitamin D-Vitamin K (CALCIUM SOFT CHEWS) 500-100-40 MG-UNT-MCG CHEW Chew 1 tablet by mouth daily.          Allergies  Allergen Reactions  . Codeine   . Pneumococcal Vaccine Polyvalent     REACTION: rash at injection site and induration.    Review of Systems  Review of Systems  Constitutional: Negative for fever, chills and malaise/fatigue.  HENT: Negative for hearing loss, nosebleeds and congestion.   Eyes: Negative for  discharge.  Respiratory: Negative for cough, sputum production, shortness of breath and wheezing.   Cardiovascular: Negative for chest pain, palpitations and leg swelling.  Gastrointestinal: Negative for heartburn, nausea, vomiting, abdominal pain, diarrhea, constipation and blood in stool.  Genitourinary: Negative for dysuria, urgency, frequency and hematuria.       G2P1 s/p 1 svd and 1 incomplete miscarriage and d&c. Menarche at 57 and menopause in mid to late 24s, had 1 episode of postmenopausal bleeding roughly 15 years ago, has not recurred, endometrial bx  Was negative  Musculoskeletal: Negative for myalgias, back pain and falls.  Skin: Negative for rash.       Dry cracked hands b/l hands from frequent hand washing has not responded to Petroleum jelly and  gloves. Thick, yellow toenails has had them in the past and responded to po Lamisil after 6 months  Neurological: Negative for dizziness, tremors, sensory change, focal weakness, loss of consciousness, weakness and headaches.  Endo/Heme/Allergies: Negative for polydipsia. Does not bruise/bleed easily.  Psychiatric/Behavioral: Negative for depression and suicidal ideas. The patient is not nervous/anxious and does not have insomnia.     Objective  BP 138/78  Pulse 53  Temp(Src) 97.6 F (36.4 C) (Oral)  Ht 5' 2.5" (1.588 m)  Wt 162 lb (73.483 kg)  BMI 29.16 kg/m2  SpO2 98%  Physical Exam  Physical Exam  Constitutional: She is oriented to person, place, and time and well-developed, well-nourished, and in no distress. No distress.  HENT:  Head: Normocephalic and atraumatic.  Right Ear: External ear normal.  Left Ear: External ear normal.  Nose: Nose normal.  Mouth/Throat: Oropharynx is clear and moist. No oropharyngeal exudate.  Eyes: Conjunctivae are normal. Pupils are equal, round, and reactive to light. Right eye exhibits no discharge. Left eye exhibits no discharge. No scleral icterus.  Neck: Normal range of motion. Neck supple. No thyromegaly present.  Cardiovascular: Normal rate, regular rhythm, normal heart sounds and intact distal pulses.   No murmur heard. Pulmonary/Chest: Effort normal and breath sounds normal. No respiratory distress. She has no wheezes. She has no rales.  Abdominal: Soft. Bowel sounds are normal. She exhibits no distension and no mass. There is no tenderness.  Genitourinary: Vagina normal, uterus normal, cervix normal, right adnexa normal and left adnexa normal. No vaginal discharge found.  Musculoskeletal: Normal range of motion. She exhibits no edema and no tenderness.  Lymphadenopathy:    She has no cervical adenopathy.  Neurological: She is alert and oriented to person, place, and time. She has normal reflexes. No cranial nerve deficit. Coordination  normal.  Skin: Skin is warm and dry. No rash noted. She is not diaphoretic.       Dry, cracked skin tips of fingers, no erythema or fluctuanc  Psychiatric: Mood, memory and affect normal.       Assessment & Plan  UNSPECIFIED VITAMIN D DEFICIENCY Has not had a level checked since June 2011, will repeat level, patient will continue her Viactiv chews tid for now  OSTEOPENIA Patient exercises regularly is encouraged to continue the same. Last Bone Densitometry was 2 years ago, encouraged to have a repeat some time in the next year  MIXED HYPERLIPIDEMIA Patient total cholesterol is up so she is agreeing to have an NMR drawn and she will continue to try and avoid trans and saturated fats, Add Metamucil daily  ELEVATED BP READING WITHOUT DX HYPERTENSION Improved control, will continue to monitor  Screening for malignant neoplasm of the cervix Pap smear taken today, encouraged  paps roughly every 3 years or as needed and MGMs every 2 years. Patient may titrate off Vagifem as tolerated at her discretion  Onychomycosis B/l toenails, great toes are the worst. Soak nightly in distilled white vinegar and water and then coat with Vick's Vapor rub.  Dermatitis Hands, try Triamcinolone ointment to apply in thin coat to hands qhs and cover with moist cotton gloves

## 2011-05-02 ENCOUNTER — Other Ambulatory Visit: Payer: Medicare Other

## 2011-05-02 DIAGNOSIS — E785 Hyperlipidemia, unspecified: Secondary | ICD-10-CM

## 2011-05-02 DIAGNOSIS — M8580 Other specified disorders of bone density and structure, unspecified site: Secondary | ICD-10-CM

## 2011-05-03 LAB — VITAMIN D 25 HYDROXY (VIT D DEFICIENCY, FRACTURES): Vit D, 25-Hydroxy: 61 ng/mL (ref 30–89)

## 2011-05-05 LAB — NMR LIPOPROFILE WITHOUT LIPIDS
HDL Particle Number: 25.9 umol/L — ABNORMAL LOW (ref >=30.5)
LP-IR Score: 11 (ref <=45)

## 2011-05-09 ENCOUNTER — Telehealth: Payer: Self-pay

## 2011-05-09 NOTE — Telephone Encounter (Signed)
Pt notified through result note.

## 2011-05-09 NOTE — Telephone Encounter (Signed)
Patient called and would like results of NMR testing

## 2011-06-06 ENCOUNTER — Telehealth: Payer: Self-pay

## 2011-06-06 NOTE — Telephone Encounter (Signed)
How much metamucil does pt take if using it to reduce cholesterol (pt is using wafers)? Please advise? Can leave a message on phone?

## 2011-06-06 NOTE — Telephone Encounter (Signed)
Informed pt that bone scan is still stable and still shows osteopenia. Also informed patient to start taking Citracal bid and include weight bearing exercise.

## 2011-06-06 NOTE — Telephone Encounter (Signed)
1 Dose daily (I believe a dose of the wafers is 2 of them, the package will day), we can increase to 2 doses down the line but we usually start with one

## 2011-06-06 NOTE — Telephone Encounter (Signed)
Patient informed. 

## 2011-06-13 ENCOUNTER — Encounter: Payer: Self-pay | Admitting: Family Medicine

## 2011-08-03 ENCOUNTER — Ambulatory Visit (INDEPENDENT_AMBULATORY_CARE_PROVIDER_SITE_OTHER): Payer: Medicare Other | Admitting: Family Medicine

## 2011-08-03 DIAGNOSIS — Z23 Encounter for immunization: Secondary | ICD-10-CM

## 2011-08-28 ENCOUNTER — Telehealth: Payer: Self-pay | Admitting: Family Medicine

## 2011-08-28 NOTE — Telephone Encounter (Signed)
Left a message for patient to return my call. 

## 2011-08-28 NOTE — Telephone Encounter (Signed)
Labs would be good, check Vitamin D, cbc, liver, renal, tsh, flp prior to visit

## 2011-08-28 NOTE — Telephone Encounter (Signed)
Pt is Medicare and has to be seen first.

## 2011-08-28 NOTE — Telephone Encounter (Signed)
Please advise 

## 2011-08-29 NOTE — Telephone Encounter (Signed)
Patient informed. 

## 2011-08-30 ENCOUNTER — Ambulatory Visit (INDEPENDENT_AMBULATORY_CARE_PROVIDER_SITE_OTHER): Payer: Medicare Other | Admitting: Family Medicine

## 2011-08-30 ENCOUNTER — Encounter: Payer: Self-pay | Admitting: Family Medicine

## 2011-08-30 DIAGNOSIS — R030 Elevated blood-pressure reading, without diagnosis of hypertension: Secondary | ICD-10-CM

## 2011-08-30 DIAGNOSIS — IMO0001 Reserved for inherently not codable concepts without codable children: Secondary | ICD-10-CM

## 2011-08-30 DIAGNOSIS — E782 Mixed hyperlipidemia: Secondary | ICD-10-CM

## 2011-08-30 DIAGNOSIS — E785 Hyperlipidemia, unspecified: Secondary | ICD-10-CM

## 2011-08-30 LAB — RENAL FUNCTION PANEL
BUN: 22 mg/dL (ref 6–23)
Chloride: 104 mEq/L (ref 96–112)
Creatinine, Ser: 0.9 mg/dL (ref 0.4–1.2)
GFR: 63.19 mL/min (ref >60.00)
Phosphorus: 4.3 mg/dL (ref 2.3–4.6)
Sodium: 140 mEq/L (ref 135–145)

## 2011-08-30 LAB — HEPATIC FUNCTION PANEL
ALT: 22 U/L (ref 0–35)
Albumin: 3.9 g/dL (ref 3.5–5.2)
Total Bilirubin: 0.9 mg/dL (ref 0.3–1.2)

## 2011-08-30 LAB — LIPID PANEL
Cholesterol: 236 mg/dL — ABNORMAL HIGH (ref 0–200)
HDL: 68.2 mg/dL (ref >39.00)

## 2011-08-30 LAB — LDL CHOLESTEROL, DIRECT: Direct LDL: 152.1 mg/dL

## 2011-08-30 NOTE — Patient Instructions (Signed)

## 2011-08-31 ENCOUNTER — Encounter: Payer: Self-pay | Admitting: Family Medicine

## 2011-08-31 NOTE — Assessment & Plan Note (Signed)
Improved on recheck, encouraged avoidance of sodium.

## 2011-08-31 NOTE — Progress Notes (Signed)
Mandy [name] [id number] 1946/02/19 08/31/2011      Progress Note-Follow Up  Subjective  Chief Complaint  Chief Complaint  Patient presents with  . Follow-up  . lab request    HPI  Patient is a 65 year old, Caucasian female who is in today for followup of multiple medical problems. She's been checking her blood sugar and see numbers in the 120s to 130s systolic. She reports she feels well. She denies any recent illness, fevers, chills, headache, chest pain palpitations, shortness of breath, GI or GU complaints. She has been trying to watch her transplant intake. She has switched to make a rent is taking vitamin B complex and some Metamucil 5 capsules daily. Exercise has been inconsistent.  Past Medical History  Diagnosis Date  . Osteopenia 10/17/1999  . Chicken pox as a child  . Measles as a child  . Miscarriage 1981  . Screening for malignant neoplasm of the cervix 04/26/2011  . Dermatitis 04/26/2011  . Onychomycosis 04/26/2011    Past Surgical History  Procedure Date  . Dilation and curettage of uterus 1981    miscarriage    Family History  Problem Relation Age of Onset  . Osteoporosis Mother   . Coronary artery disease Mother   . Heart attack Mother 78  . Hypertension Mother   . Diabetes Father     type 2  . Heart disease Father   . Stroke Father   . Dementia Maternal Grandmother   . Asthma Maternal Grandfather   . Cancer Paternal Grandmother     ovarian  . Diabetes Paternal Grandfather     tyoe 2  . Hearing loss Paternal Grandfather   . Arthritis Other   . Hyperlipidemia Other   . Hypertension Other   . Other Other     Cardiovascular disorder    History   Social History  . Marital Status: Married    Spouse Name: N/A    Number of Children: N/A  . Years of Education: N/A   Occupational History  . bookkeeper    Social History Main Topics  . Smoking status: Former Smoker -- 1.0 packs/day    Types: Cigarettes    Quit date: 10/16/2000  . Smokeless  tobacco: Never Used  . Alcohol Use: No  . Drug Use: No  . Sexually Active: Yes -- Female partner(s)   Other Topics Concern  . Not on file   Social History Narrative   Regular exercise- yesDoes Yoga 3-4 X week, personal trainer 1 X week, and walks regularlyHas lost 16 pounds by minimizing processed foods and meats    Current Outpatient Prescriptions on File Prior to Visit  Medication Sig Dispense Refill  . calcium citrate-vitamin D (CITRACAL+D) 315-200 MG-UNIT per tablet Take 1 tablet by mouth 2 (two) times daily.        . Estradiol (VAGIFEM) 10 MCG TABS Place 1 tablet (10 mcg total) vaginally 2 (two) times a week.  24 tablet  1  . fish oil-omega-3 fatty acids 1000 MG capsule Take 1 g by mouth daily.        . multivitamin (THERAGRAN) per tablet Take 1 tablet by mouth daily.        Marland Kitchen triamcinolone (KENALOG) 0.1 % ointment Apply to hands qhs prn dermatitis and place  On damp gloves  30 g  2    Allergies  Allergen Reactions  . Codeine   . Pneumococcal Vaccine Polyvalent     REACTION: rash at injection site and induration.  Review of Systems  Review of Systems  Constitutional: Negative for fever and malaise/fatigue.  HENT: Negative for congestion.   Eyes: Negative for discharge.  Respiratory: Negative for shortness of breath.   Cardiovascular: Negative for chest pain, palpitations and leg swelling.  Gastrointestinal: Negative for nausea, abdominal pain and diarrhea.  Genitourinary: Negative for dysuria.  Musculoskeletal: Negative for falls.  Skin: Negative for rash.  Neurological: Negative for loss of consciousness and headaches.  Endo/Heme/Allergies: Negative for polydipsia.  Psychiatric/Behavioral: Negative for depression and suicidal ideas. The patient is not nervous/anxious and does not have insomnia.     Objective  BP 130/82  Pulse 62  Temp(Src) 97.7 F (36.5 C) (Oral)  Ht 5' 2.5" (1.588 m)  Wt 162 lb 1.9 oz (73.537 kg)  BMI 29.18 kg/m2  SpO2 98%  Physical  Exam  Physical Exam  Constitutional: She is oriented to person, place, and time and well-developed, well-nourished, and in no distress. No distress.  HENT:  Head: Normocephalic and atraumatic.  Eyes: Conjunctivae are normal.  Neck: Neck supple. No thyromegaly present.  Cardiovascular: Normal rate, regular rhythm and normal heart sounds.   No murmur heard. Pulmonary/Chest: Effort normal and breath sounds normal. She has no wheezes.  Abdominal: She exhibits no distension and no mass.  Musculoskeletal: She exhibits no edema.  Lymphadenopathy:    She has no cervical adenopathy.  Neurological: She is alert and oriented to person, place, and time.  Skin: Skin is warm and dry. No rash noted. She is not diaphoretic.  Psychiatric: Memory, affect and judgment normal.    Lab Results  Component Value Date   TSH 2.39 04/10/2011   Lab Results  Component Value Date   WBC 3.7* 04/10/2011   HGB 13.3 04/10/2011   HCT 39.2 04/10/2011   MCV 96.4 04/10/2011   PLT 309.0 04/10/2011   Lab Results  Component Value Date   CREATININE 0.9 08/30/2011   BUN 22 08/30/2011   NA 140 08/30/2011   K 4.9 08/30/2011   CL 104 08/30/2011   CO2 31 08/30/2011   Lab Results  Component Value Date   ALT 22 08/30/2011   AST 24 08/30/2011   ALKPHOS 44 08/30/2011   BILITOT 0.9 08/30/2011   Lab Results  Component Value Date   CHOL 236* 08/30/2011   Lab Results  Component Value Date   HDL 68.20 08/30/2011   No results found for this basename: [facility]   Lab Results  Component Value Date   TRIG 85.0 08/30/2011   Lab Results  Component Value Date   CHOLHDL 3 08/30/2011     Assessment & Plan  ELEVATED BP READING WITHOUT DX HYPERTENSION Improved on recheck, encouraged avoidance of sodium.   MIXED HYPERLIPIDEMIA Encouraged continue Megared and metamucil as well as vitamin B complex. Avoid trans fats and simple carbs and increase activity level

## 2011-08-31 NOTE — Assessment & Plan Note (Signed)
Encouraged continue Megared and metamucil as well as vitamin B complex. Avoid trans fats and simple carbs and increase activity level

## 2011-12-11 ENCOUNTER — Other Ambulatory Visit: Payer: Self-pay

## 2011-12-11 MED ORDER — ESTRADIOL 10 MCG VA TABS: 1.0000 | ORAL_TABLET | VAGINAL | Status: DC

## 2012-04-19 ENCOUNTER — Other Ambulatory Visit (INDEPENDENT_AMBULATORY_CARE_PROVIDER_SITE_OTHER): Payer: Medicare Other

## 2012-04-19 DIAGNOSIS — E785 Hyperlipidemia, unspecified: Secondary | ICD-10-CM

## 2012-04-19 DIAGNOSIS — E782 Mixed hyperlipidemia: Secondary | ICD-10-CM

## 2012-04-19 DIAGNOSIS — I10 Essential (primary) hypertension: Secondary | ICD-10-CM

## 2012-04-19 DIAGNOSIS — E559 Vitamin D deficiency, unspecified: Secondary | ICD-10-CM

## 2012-04-19 LAB — RENAL FUNCTION PANEL
CO2: 28 mEq/L (ref 19–32)
Chloride: 103 mEq/L (ref 96–112)
GFR: 64.66 mL/min (ref >60.00)
Potassium: 4.6 mEq/L (ref 3.5–5.1)
Sodium: 140 mEq/L (ref 135–145)

## 2012-04-19 LAB — CBC
MCHC: 32.8 g/dL (ref 30.0–36.0)
MCV: 97.2 fl (ref 78.0–100.0)
Platelets: 287 10*3/uL (ref 150.0–400.0)

## 2012-04-19 LAB — HEPATIC FUNCTION PANEL
AST: 19 U/L (ref 0–37)
Alkaline Phosphatase: 35 U/L — ABNORMAL LOW (ref 39–117)
Total Bilirubin: 0.7 mg/dL (ref 0.3–1.2)

## 2012-04-19 LAB — LIPID PANEL
Cholesterol: 249 mg/dL — ABNORMAL HIGH (ref 0–200)
HDL: 62.1 mg/dL (ref >39.00)
Triglycerides: 128 mg/dL (ref 0.0–149.0)
VLDL: 25.6 mg/dL (ref 0.0–40.0)

## 2012-04-19 LAB — TSH: TSH: 1.82 u[IU]/mL (ref 0.35–5.50)

## 2012-04-22 LAB — NMR LIPOPROFILE WITH LIPIDS
HDL Particle Number: 38 umol/L (ref >=30.5)
HDL-C: 68 mg/dL (ref >=40)
LDL Size: 21.4 nm (ref >20.5)
LP-IR Score: 19 (ref <=45)
Small LDL Particle Number: 588 nmol/L — ABNORMAL HIGH (ref <=527)

## 2012-04-25 ENCOUNTER — Encounter: Payer: Self-pay | Admitting: Family Medicine

## 2012-04-25 ENCOUNTER — Ambulatory Visit (INDEPENDENT_AMBULATORY_CARE_PROVIDER_SITE_OTHER): Payer: Medicare Other | Admitting: Family Medicine

## 2012-04-25 VITALS — BP 138/92 | HR 62 | Ht 62.25 in | Wt 167.0 lb

## 2012-04-25 DIAGNOSIS — E559 Vitamin D deficiency, unspecified: Secondary | ICD-10-CM

## 2012-04-25 DIAGNOSIS — Z124 Encounter for screening for malignant neoplasm of cervix: Secondary | ICD-10-CM

## 2012-04-25 DIAGNOSIS — E782 Mixed hyperlipidemia: Secondary | ICD-10-CM

## 2012-04-25 DIAGNOSIS — R030 Elevated blood-pressure reading, without diagnosis of hypertension: Secondary | ICD-10-CM

## 2012-04-25 DIAGNOSIS — E785 Hyperlipidemia, unspecified: Secondary | ICD-10-CM

## 2012-04-25 DIAGNOSIS — M949 Disorder of cartilage, unspecified: Secondary | ICD-10-CM

## 2012-04-25 DIAGNOSIS — M899 Disorder of bone, unspecified: Secondary | ICD-10-CM

## 2012-04-25 MED ORDER — KRILL OIL PO CAPS: ORAL_CAPSULE | ORAL | Status: DC

## 2012-04-25 NOTE — Assessment & Plan Note (Signed)
Avoid trans fats continue exercise and add Krill oil

## 2012-04-25 NOTE — Assessment & Plan Note (Signed)
Adequate control at today's visit, no new medications, given a handout on the dash diet

## 2012-04-25 NOTE — Assessment & Plan Note (Signed)
Had bone scan last year, does not need repeat this year. Continue Ca/Vit D and exercise

## 2012-04-25 NOTE — Assessment & Plan Note (Signed)
Well treated at this time, no changes to therapy

## 2012-04-25 NOTE — Progress Notes (Signed)
Patient ID: Mandy [name], female   DOB: 1946/05/10, 66 y.o.   MRN: [id number] CARLEI HUANG [id number] November 14, 1945 04/25/2012      Progress Note New Patient  Subjective  Chief Complaint  No chief complaint on file.   HPI  66 year old Caucasian female who is in today for annual exam. Overall she's doing well. She denies any recent illness, tripped to the ER, GI or GU complaints. She's not had any chest pain, shortness of breath, palpitations, congestion or headaches. Overall she is pleased with her state of health. She stays very active she does yoga several times a week and cardiovascular exercise rest of the week also works with Systems analyst once a week. She tries to eat a heart healthy diet and minimize saturated fats. She avoids transplants and simple carbohydrates. She notes since dropping her Vagifem to once a week she's had increasing hot flashes having roughly one a day. Did not have any trouble this with 2 doses weekly so is encouraged to restart biweekly dosing.  Past Medical History  Diagnosis Date  . Osteopenia 10/17/1999  . Chicken pox as a child  . Measles as a child  . Miscarriage 1981  . Screening for malignant neoplasm of the cervix 04/26/2011  . Dermatitis 04/26/2011  . Onychomycosis 04/26/2011    Past Surgical History  Procedure Date  . Dilation and curettage of uterus 1981    miscarriage    Family History  Problem Relation Age of Onset  . Osteoporosis Mother   . Coronary artery disease Mother   . Heart attack Mother 15  . Hypertension Mother   . Diabetes Father     type 2  . Heart disease Father   . Stroke Father   . Dementia Maternal Grandmother   . Asthma Maternal Grandfather   . Cancer Paternal Grandmother     ovarian  . Diabetes Paternal Grandfather     tyoe 2  . Hearing loss Paternal Grandfather   . Arthritis Other   . Hyperlipidemia Other   . Hypertension Other   . Other Other     Cardiovascular disorder    History   Social History  .  Marital Status: Married    Spouse Name: N/A    Number of Children: N/A  . Years of Education: N/A   Occupational History  . bookkeeper    Social History Main Topics  . Smoking status: Former Smoker -- 1.0 packs/day    Types: Cigarettes    Quit date: 10/16/2000  . Smokeless tobacco: Never Used  . Alcohol Use: No  . Drug Use: No  . Sexually Active: Yes -- Female partner(s)   Other Topics Concern  . Not on file   Social History Narrative   Regular exercise- yesDoes Yoga 3-4 X week, personal trainer 1 X week, and walks regularlyHas lost 16 pounds by minimizing processed foods and meats    Current Outpatient Prescriptions on File Prior to Visit  Medication Sig Dispense Refill  . calcium citrate-vitamin D (CITRACAL+D) 315-200 MG-UNIT per tablet Take 1 tablet by mouth 2 (two) times daily.        . Estradiol (VAGIFEM) 10 MCG TABS Place 1 tablet (10 mcg total) vaginally 2 (two) times a week.  24 tablet  1  . multivitamin (THERAGRAN) per tablet Take 1 tablet by mouth daily.        Marland Kitchen triamcinolone (KENALOG) 0.1 % ointment Apply to hands qhs prn dermatitis and place  On damp gloves  30 g  2    Allergies  Allergen Reactions  . Codeine   . Pneumococcal Vaccine Polyvalent     REACTION: rash at injection site and induration.    Review of Systems  Review of Systems  Constitutional: Negative for fever, chills and malaise/fatigue.  HENT: Negative for hearing loss, nosebleeds and congestion.   Eyes: Negative for discharge.  Respiratory: Negative for cough, sputum production, shortness of breath and wheezing.   Cardiovascular: Negative for chest pain, palpitations and leg swelling.  Gastrointestinal: Negative for heartburn, nausea, vomiting, abdominal pain, diarrhea, constipation and blood in stool.  Genitourinary: Negative for dysuria, urgency, frequency and hematuria.  Musculoskeletal: Negative for myalgias, back pain and falls.  Skin: Negative for rash.  Neurological: Negative for  dizziness, tremors, sensory change, focal weakness, loss of consciousness, weakness and headaches.  Endo/Heme/Allergies: Negative for polydipsia. Does not bruise/bleed easily.  Psychiatric/Behavioral: Negative for depression and suicidal ideas. The patient is not nervous/anxious and does not have insomnia.     Objective  BP 138/92  Pulse 62  Ht 5' 2.25" (1.581 m)  Wt 167 lb (75.751 kg)  BMI 30.30 kg/m2  SpO2 98%  Physical Exam  Physical Exam  Constitutional: She is oriented to person, place, and time and well-developed, well-nourished, and in no distress. No distress.  HENT:  Head: Normocephalic and atraumatic.  Right Ear: External ear normal.  Left Ear: External ear normal.  Mouth/Throat: No oropharyngeal exudate.  Eyes: Conjunctivae and EOM are normal. Pupils are equal, round, and reactive to light. Left eye exhibits no discharge. No scleral icterus.  Neck: No JVD present. No tracheal deviation present.  Cardiovascular: Normal rate, regular rhythm, normal heart sounds and intact distal pulses.   Pulmonary/Chest: No respiratory distress. She has no rales.  Abdominal: Soft. Bowel sounds are normal. She exhibits no distension and no mass. There is tenderness. There is no guarding.  Musculoskeletal: She exhibits no edema and no tenderness.  Lymphadenopathy:    She has no cervical adenopathy.  Neurological: She is alert and oriented to person, place, and time.  Skin: Skin is warm and dry. No rash noted. She is not diaphoretic. No erythema.  Psychiatric: Mood, memory, affect and judgment normal.       Assessment & Plan  UNSPECIFIED VITAMIN D DEFICIENCY Well treated at this time, no changes to therapy  ELEVATED BP READING WITHOUT DX HYPERTENSION Adequate control at today's visit, no new medications, given a handout on the dash diet  MIXED HYPERLIPIDEMIA Avoid trans fats continue exercise and add Krill oil  OSTEOPENIA Had bone scan last year, does not need repeat this  year. Continue Ca/Vit D and exercise  Screening for malignant neoplasm of the cervix Normal last year for repeat for 2 more years. Given rx to go get her MGM at [facility] repeated, Has been over 10 years since colonoscopy. Encouraged to repeat and she agrees has someone in [place] she would like to use, will forward [place] results

## 2012-04-25 NOTE — Assessment & Plan Note (Signed)
Normal last year for repeat for 2 more years. Given rx to go get her MGM at [facility] repeated, Has been over 10 years since colonoscopy. Encouraged to repeat and she agrees has someone in [place] she would like to use, will forward [place] results

## 2012-04-25 NOTE — Patient Instructions (Addendum)

## 2012-06-13 ENCOUNTER — Encounter: Payer: Self-pay | Admitting: Family Medicine

## 2012-07-01 ENCOUNTER — Telehealth: Payer: Self-pay

## 2012-07-01 MED ORDER — MUPIROCIN CALCIUM 2 % NA OINT: TOPICAL_OINTMENT | Freq: Every day | NASAL | Status: DC

## 2012-07-01 NOTE — Telephone Encounter (Signed)
Pt informed and RX sent to pharmacy  

## 2012-07-01 NOTE — Telephone Encounter (Signed)
Likely just an irritated vessel. Send her in a prescription for Bactroban ointment to apply to nares qhs x 10 days, Disp # 1 tube and no rf to promote healing if no improvement she should come in for examination and possible referral to ENT. If they become difficult to control or she has any other concerning symptoms she should come in sooner

## 2012-07-01 NOTE — Telephone Encounter (Signed)
Pt states that her left nostril has been bleeding about 5 times a week for 2-3 months, only at night. Pt states she can get it to stop after about 60 seconds. Pts husband was concerned and wanted pt to call md. Please advise?

## 2012-07-11 LAB — HM COLONOSCOPY

## 2012-07-31 ENCOUNTER — Ambulatory Visit (INDEPENDENT_AMBULATORY_CARE_PROVIDER_SITE_OTHER): Payer: Medicare Other

## 2012-07-31 DIAGNOSIS — Z23 Encounter for immunization: Secondary | ICD-10-CM

## 2012-08-12 ENCOUNTER — Other Ambulatory Visit: Payer: Self-pay

## 2012-08-12 MED ORDER — ESTRADIOL 10 MCG VA TABS: 1.0000 | ORAL_TABLET | VAGINAL | Status: DC

## 2012-09-27 ENCOUNTER — Other Ambulatory Visit: Payer: Self-pay

## 2012-09-27 DIAGNOSIS — L309 Dermatitis, unspecified: Secondary | ICD-10-CM

## 2012-09-27 MED ORDER — TRIAMCINOLONE ACETONIDE 0.1 % EX OINT: TOPICAL_OINTMENT | CUTANEOUS | Status: DC

## 2012-10-18 ENCOUNTER — Other Ambulatory Visit: Payer: Medicare Other

## 2012-10-25 ENCOUNTER — Ambulatory Visit: Payer: Medicare Other | Admitting: Family Medicine

## 2012-11-12 ENCOUNTER — Other Ambulatory Visit (INDEPENDENT_AMBULATORY_CARE_PROVIDER_SITE_OTHER): Payer: Medicare Other

## 2012-11-12 DIAGNOSIS — E785 Hyperlipidemia, unspecified: Secondary | ICD-10-CM

## 2012-11-12 DIAGNOSIS — R030 Elevated blood-pressure reading, without diagnosis of hypertension: Secondary | ICD-10-CM

## 2012-11-12 LAB — RENAL FUNCTION PANEL
Albumin: 3.9 g/dL (ref 3.5–5.2)
BUN: 19 mg/dL (ref 6–23)
Chloride: 102 mEq/L (ref 96–112)
Phosphorus: 4.2 mg/dL (ref 2.3–4.6)

## 2012-11-12 LAB — CBC
HCT: 39.3 % (ref 36.0–46.0)
Hemoglobin: 13 g/dL (ref 12.0–15.0)
Platelets: 299 10*3/uL (ref 150.0–400.0)
RBC: 4.07 Mil/uL (ref 3.87–5.11)
WBC: 5.1 10*3/uL (ref 4.5–10.5)

## 2012-11-12 LAB — HEPATIC FUNCTION PANEL
Alkaline Phosphatase: 42 U/L (ref 39–117)
Bilirubin, Direct: 0 mg/dL (ref 0.0–0.3)
Total Protein: 6.6 g/dL (ref 6.0–8.3)

## 2012-11-12 LAB — LIPID PANEL: VLDL: 18 mg/dL (ref 0.0–40.0)

## 2012-11-12 NOTE — Progress Notes (Signed)
Labs only

## 2012-11-13 LAB — NMR LIPOPROFILE WITH LIPIDS
Cholesterol, Total: 205 mg/dL — ABNORMAL HIGH (ref <200)
HDL Particle Number: 34.5 umol/L (ref >=30.5)
HDL-C: 55 mg/dL (ref >=40)
LDL Size: 21.5 nm (ref >20.5)
Large HDL-P: 10.1 umol/L (ref >=4.8)
Triglycerides: 90 mg/dL (ref <150)

## 2012-11-18 ENCOUNTER — Ambulatory Visit (INDEPENDENT_AMBULATORY_CARE_PROVIDER_SITE_OTHER): Payer: Medicare Other | Admitting: Family Medicine

## 2012-11-18 ENCOUNTER — Encounter: Payer: Self-pay | Admitting: Family Medicine

## 2012-11-18 ENCOUNTER — Ambulatory Visit: Payer: Medicare Other | Admitting: Family Medicine

## 2012-11-18 VITALS — BP 162/94 | HR 75 | Temp 97.5°F | Ht 62.5 in | Wt 144.0 lb

## 2012-11-18 DIAGNOSIS — N952 Postmenopausal atrophic vaginitis: Secondary | ICD-10-CM

## 2012-11-18 DIAGNOSIS — E782 Mixed hyperlipidemia: Secondary | ICD-10-CM

## 2012-11-18 DIAGNOSIS — R030 Elevated blood-pressure reading, without diagnosis of hypertension: Secondary | ICD-10-CM

## 2012-11-18 HISTORY — DX: Postmenopausal atrophic vaginitis: N95.2

## 2012-11-18 MED ORDER — ESTROGENS, CONJUGATED 0.625 MG/GM VA CREA: TOPICAL_CREAM | VAGINAL | Status: DC

## 2012-11-18 NOTE — Progress Notes (Signed)
Patient ID: Mandy [name], female   DOB: [birth year], 67 y.o.   MRN: [id number] Mandy [name] [id number] [birth year] 11/18/2012      Progress Note-Follow Up  Subjective  Chief Complaint  Chief Complaint  Patient presents with  . Follow-up    6 month    HPI  Patient is a 67 yo Caucasian female who is in today for a followup. She feels well. No recent illness, fevers, chills, headache, chest pain, palpitations, shortness of breath, GI or GU complaints. She has been exercising regularly and eating a heart healthy diet. Has had good weight loss. She was at a [employer] party last night and acknowledges eating a great deal of sodium and having a lot of caffeine this morning. She also got very little sleep.  Past Medical History  Diagnosis Date  . Osteopenia 10/17/1999  . Chicken pox as a child  . Measles as a child  . Miscarriage 1981  . Screening for malignant neoplasm of the cervix 04/26/2011  . Dermatitis 04/26/2011  . Onychomycosis 04/26/2011  . Atrophic vaginitis 11/18/2012    Past Surgical History  Procedure Date  . Dilation and curettage of uterus 1981    miscarriage    Family History  Problem Relation Age of Onset  . Osteoporosis Mother   . Coronary artery disease Mother   . Heart attack Mother 47  . Hypertension Mother   . Diabetes Father     type 2  . Heart disease Father   . Stroke Father   . Dementia Maternal Grandmother   . Asthma Maternal Grandfather   . Cancer Paternal Grandmother     ovarian  . Diabetes Paternal Grandfather     tyoe 2  . Hearing loss Paternal Grandfather   . Arthritis Other   . Hyperlipidemia Other   . Hypertension Other   . Other Other     Cardiovascular disorder    History   Social History  . Marital Status: Married    Spouse Name: N/A    Number of Children: N/A  . Years of Education: N/A   Occupational History  . bookkeeper    Social History Main Topics  . Smoking status: Former Smoker -- 1.0 packs/day    Types: Cigarettes   Quit date: 10/16/2000  . Smokeless tobacco: Never Used  . Alcohol Use: No  . Drug Use: No  . Sexually Active: Yes -- Female partner(s)   Other Topics Concern  . Not on file   Social History Narrative   Regular exercise- yesDoes Yoga 3-4 X week, personal trainer 1 X week, and walks regularlyHas lost 16 pounds by minimizing processed foods and meats    Current Outpatient Prescriptions on File Prior to Visit  Medication Sig Dispense Refill  . b complex vitamins tablet Take 1 tablet by mouth daily.      . calcium citrate-vitamin D (CITRACAL+D) 315-200 MG-UNIT per tablet Take 1 tablet by mouth 2 (two) times daily.        [facility]ius CAPS 1 cap po qd MegaRed      . multivitamin (THERAGRAN) per tablet Take 1 tablet by mouth daily.        . psyllium (METAMUCIL) 0.52 G capsule Take 2.6 g by mouth daily. 5 capsules      . triamcinolone ointment (KENALOG) 0.1 % Apply to hands qhs prn dermatitis and place  On damp gloves  30 g  2  . vitamin C (ASCORBIC ACID) 500 MG tablet Take 500  mg by mouth daily.      . mupirocin nasal ointment (BACTROBAN) 2 % Place into the nose at bedtime. Use one-half of tube in each nostril twice daily for ten (10) days. After application, press sides of nose together and gently massage.  10 g  0    Allergies  Allergen Reactions  . Codeine   . Pneumococcal Vaccine Polyvalent     REACTION: rash at injection site and induration.    Review of Systems  Review of Systems  Constitutional: Negative for fever and malaise/fatigue.  HENT: Negative for congestion.   Eyes: Negative for discharge.  Respiratory: Negative for shortness of breath.   Cardiovascular: Negative for chest pain, palpitations and leg swelling.  Gastrointestinal: Negative for nausea, abdominal pain and diarrhea.  Genitourinary: Negative for dysuria.  Musculoskeletal: Negative for falls.  Skin: Negative for rash.  Neurological: Negative for loss of consciousness and headaches.  Endo/Heme/Allergies:  Negative for polydipsia.  Psychiatric/Behavioral: Negative for depression and suicidal ideas. The patient is not nervous/anxious and does not have insomnia.     Objective  BP 162/94  Pulse 75  Temp 97.5 F (36.4 C) (Temporal)  Ht 5' 2.5" (1.588 m)  Wt 144 lb (65.318 kg)  BMI 25.92 kg/m2  SpO2 95%  Physical Exam  Physical Exam  Constitutional: She is oriented to person, place, and time and well-developed, well-nourished, and in no distress. No distress.  HENT:  Head: Normocephalic and atraumatic.  Eyes: Conjunctivae normal are normal.  Neck: Neck supple. No thyromegaly present.  Cardiovascular: Normal rate, regular rhythm and normal heart sounds.   No murmur heard. Pulmonary/Chest: Effort normal and breath sounds normal. She has no wheezes.  Abdominal: She exhibits no distension and no mass.  Musculoskeletal: She exhibits no edema.  Lymphadenopathy:    She has no cervical adenopathy.  Neurological: She is alert and oriented to person, place, and time.  Skin: Skin is warm and dry. No rash noted. She is not diaphoretic.  Psychiatric: Memory, affect and judgment normal.    Lab Results  Component Value Date   TSH 1.49 11/12/2012   Lab Results  Component Value Date   WBC 5.1 11/12/2012   HGB 13.0 11/12/2012   HCT 39.3 11/12/2012   MCV 96.6 11/12/2012   PLT 299.0 11/12/2012   Lab Results  Component Value Date   CREATININE 0.8 11/12/2012   BUN 19 11/12/2012   NA 139 11/12/2012   K 4.5 11/12/2012   CL 102 11/12/2012   CO2 32 11/12/2012   Lab Results  Component Value Date   ALT 14 11/12/2012   AST 20 11/12/2012   ALKPHOS 42 11/12/2012   BILITOT 1.0 11/12/2012   Lab Results  Component Value Date   CHOL 206* 11/12/2012   Lab Results  Component Value Date   HDL 56.00 11/12/2012   Lab Results  Component Value Date   LDLCALC 132* 11/12/2012   Lab Results  Component Value Date   TRIG 90.0 11/12/2012   TRIG 90 11/12/2012   Lab Results  Component Value Date   CHOLHDL 4  11/12/2012     Assessment & Plan  ELEVATED BP READING WITHOUT DX HYPERTENSION Patient generally not noted to have such high blood pressure was at a [employer] party last night eating significant amounts of sodium. Had a good deal of caffeine this am as well. Encouraged to minimize sodium and heaviness next month and have a blood pressure check after that. Will need to start medications if it  remains elevated.  MIXED HYPERLIPIDEMIA Good improvement with weight loss and exercise encouraged to continue the same. Continue Krill oil  Atrophic vaginitis Incomplete response to Vagifem. We'll switch to Premarin cream 3 times weekly and then titrate down to once weekly as tolerated.

## 2012-11-18 NOTE — Assessment & Plan Note (Signed)
Good improvement with weight loss and exercise encouraged to continue the same. Continue Krill oil

## 2012-11-18 NOTE — Assessment & Plan Note (Signed)
Incomplete response to Vagifem. We'll switch to Premarin cream 3 times weekly and then titrate down to once weekly as tolerated.

## 2012-11-18 NOTE — Patient Instructions (Addendum)
Next visit in 1 month is nurse visit, BP check  6 month with MD for annual GYN with labs prior to visit, lipid, renal, cbc, tsh, hepatic  Watch the sodium and caffeine this next month

## 2012-11-18 NOTE — Assessment & Plan Note (Signed)
Patient generally not noted to have such high blood pressure was at a [employer] party last night eating significant amounts of sodium. Had a good deal of caffeine this am as well. Encouraged to minimize sodium and heaviness next month and have a blood pressure check after that. Will need to start medications if it remains elevated.

## 2012-12-19 ENCOUNTER — Ambulatory Visit (INDEPENDENT_AMBULATORY_CARE_PROVIDER_SITE_OTHER): Payer: Medicare Other | Admitting: Family Medicine

## 2012-12-19 ENCOUNTER — Ambulatory Visit: Payer: Medicare Other | Admitting: Family Medicine

## 2012-12-19 ENCOUNTER — Encounter: Payer: Self-pay | Admitting: Family Medicine

## 2012-12-19 ENCOUNTER — Ambulatory Visit: Payer: Medicare Other

## 2012-12-19 VITALS — BP 158/98 | HR 68 | Temp 97.6°F | Ht 62.5 in | Wt 142.0 lb

## 2012-12-19 VITALS — BP 152/100 | HR 69 | Ht 62.5 in | Wt 142.0 lb

## 2012-12-19 DIAGNOSIS — R030 Elevated blood-pressure reading, without diagnosis of hypertension: Secondary | ICD-10-CM

## 2012-12-19 DIAGNOSIS — I10 Essential (primary) hypertension: Secondary | ICD-10-CM

## 2012-12-19 MED ORDER — LISINOPRIL 10 MG PO TABS: 10.0000 mg | ORAL_TABLET | Freq: Every day | ORAL | Status: DC

## 2012-12-19 NOTE — Patient Instructions (Addendum)
  Next visit nurse bp check and renal panel  Hypertension As your heart beats, it forces blood through your arteries. This force is your blood pressure. If the pressure is too high, it is called hypertension (HTN) or high blood pressure. HTN is dangerous because you may have it and not know it. High blood pressure may mean that your heart has to work harder to pump blood. Your arteries may be narrow or stiff. The extra work puts you at risk for heart disease, stroke, and other problems.  Blood pressure consists of two numbers, a higher number over a lower, 110/72, for example. It is stated as "110 over 72." The ideal is below 120 for the top number (systolic) and under 80 for the bottom (diastolic). Write down your blood pressure today. You should pay close attention to your blood pressure if you have certain conditions such as:  Heart failure.  Prior heart attack.  Diabetes  Chronic kidney disease.  Prior stroke.  Multiple risk factors for heart disease. To see if you have HTN, your blood pressure should be measured while you are seated with your arm held at the level of the heart. It should be measured at least twice. A one-time elevated blood pressure reading (especially in the Emergency Department) does not mean that you need treatment. There may be conditions in which the blood pressure is different between your right and left arms. It is important to see your caregiver soon for a recheck. Most people have essential hypertension which means that there is not a specific cause. This type of high blood pressure may be lowered by changing lifestyle factors such as:  Stress.  Smoking.  Lack of exercise.  Excessive weight.  Drug/tobacco/alcohol use.  Eating less salt. Most people do not have symptoms from high blood pressure until it has caused damage to the body. Effective treatment can often prevent, delay or reduce that damage. TREATMENT  When a cause has been identified,  treatment for high blood pressure is directed at the cause. There are a large number of medications to treat HTN. These fall into several categories, and your caregiver will help you select the medicines that are best for you. Medications may have side effects. You should review side effects with your caregiver. If your blood pressure stays high after you have made lifestyle changes or started on medicines,   Your medication(s) may need to be changed.  Other problems may need to be addressed.  Be certain you understand your prescriptions, and know how and when to take your medicine.  Be sure to follow up with your caregiver within the time frame advised (usually within two weeks) to have your blood pressure rechecked and to review your medications.  If you are taking more than one medicine to lower your blood pressure, make sure you know how and at what times they should be taken. Taking two medicines at the same time can result in blood pressure that is too low. SEEK IMMEDIATE MEDICAL CARE IF:  You develop a severe headache, blurred or changing vision, or confusion.  You have unusual weakness or numbness, or a faint feeling.  You have severe chest or abdominal pain, vomiting, or breathing problems. MAKE SURE YOU:   Understand these instructions.  Will watch your condition.  Will get help right away if you are not doing well or get worse. Document Released: 10/02/2005 Document Revised: 12/25/2011 Document Reviewed: 05/22/2008 [facility] Patient Information 2013 [place], [place].

## 2012-12-23 ENCOUNTER — Encounter: Payer: Self-pay | Admitting: Family Medicine

## 2012-12-23 NOTE — Assessment & Plan Note (Signed)
Lisinopril 10 mg daily, encouraged DASH diet and increased exercise.

## 2012-12-23 NOTE — Progress Notes (Signed)
Patient ID: Mandy [name], female   DOB: [birth year], 67 y.o.   MRN: [id number] Mandy [name] [id number] April 18, 1946 12/23/2012      Progress Note-Follow Up  Subjective  Chief Complaint  Chief Complaint  Patient presents with  . Follow-up    BP    HPI   patient is a 67 year old Caucasian female who is in today for blood pressure check and unfortunately her blood pressure remains high. She has been e getting adequate sleep and offers no complaints of recent illness or stress.xercising regularly and eating well. Trying to minimize sodium.  Past Medical History  Diagnosis Date  . Osteopenia 10/17/1999  . Chicken pox as a child  . Measles as a child  . Miscarriage 1981  . Screening for malignant neoplasm of the cervix 04/26/2011  . Dermatitis 04/26/2011  . Onychomycosis 04/26/2011  . Atrophic vaginitis 11/18/2012    Past Surgical History  Procedure Laterality Date  . Dilation and curettage of uterus  1981    miscarriage    Family History  Problem Relation Age of Onset  . Osteoporosis Mother   . Coronary artery disease Mother   . Heart attack Mother 26  . Hypertension Mother   . Diabetes Father     type 2  . Heart disease Father   . Stroke Father   . Dementia Maternal Grandmother   . Asthma Maternal Grandfather   . Cancer Paternal Grandmother     ovarian  . Diabetes Paternal Grandfather     tyoe 2  . Hearing loss Paternal Grandfather   . Arthritis Other   . Hyperlipidemia Other   . Hypertension Other   . Other Other     Cardiovascular disorder    History   Social History  . Marital Status: Married    Spouse Name: N/A    Number of Children: N/A  . Years of Education: N/A   Occupational History  . bookkeeper    Social History Main Topics  . Smoking status: Former Smoker -- 1.00 packs/day    Types: Cigarettes    Quit date: 10/16/2000  . Smokeless tobacco: Never Used  . Alcohol Use: No  . Drug Use: No  . Sexually Active: Yes -- Female partner(s)   Other Topics  Concern  . Not on file   Social History Narrative   Regular exercise- yes   Does Yoga 3-4 X week, personal trainer 1 X week, and walks regularly   Has lost 16 pounds by minimizing processed foods and meats    Current Outpatient Prescriptions on File Prior to Visit  Medication Sig Dispense Refill  . aspirin 81 MG tablet Take 81 mg by mouth daily.      Marland Kitchen b complex vitamins tablet Take 1 tablet by mouth daily.      . calcium citrate-vitamin D (CITRACAL+D) 315-200 MG-UNIT per tablet Take 1 tablet by mouth 2 (two) times daily.        [place] [employer] CAPS 1 cap po qd MegaRed      . multivitamin (THERAGRAN) per tablet Take 1 tablet by mouth daily.        . psyllium (METAMUCIL) 0.52 G capsule Take 2.6 g by mouth daily. 5 capsules      . vitamin C (ASCORBIC ACID) 500 MG tablet Take 500 mg by mouth daily.      Marland Kitchen conjugated estrogens (PREMARIN) vaginal cream Apply small amount PV 3 x weekly qhs  42.5 g  3  . mupirocin nasal ointment (  BACTROBAN) 2 % Place into the nose at bedtime. Use one-half of tube in each nostril twice daily for ten (10) days. After application, press sides of nose together and gently massage.  10 g  0  . triamcinolone ointment (KENALOG) 0.1 % Apply to hands qhs prn dermatitis and place  On damp gloves  30 g  2   No current facility-administered medications on file prior to visit.    Allergies  Allergen Reactions  . Codeine   . Pneumococcal Vaccine Polyvalent     REACTION: rash at injection site and induration.    Review of Systems  Review of Systems  Constitutional: Negative for fever and malaise/fatigue.  HENT: Negative for congestion.   Eyes: Negative for discharge.  Respiratory: Negative for shortness of breath.   Cardiovascular: Negative for chest pain, palpitations and leg swelling.  Gastrointestinal: Negative for nausea, abdominal pain and diarrhea.  Genitourinary: Negative for dysuria.  Musculoskeletal: Negative for falls.  Skin: Negative for rash.   Neurological: Negative for loss of consciousness and headaches.  Endo/Heme/Allergies: Negative for polydipsia.  Psychiatric/Behavioral: Negative for depression and suicidal ideas. The patient is not nervous/anxious and does not have insomnia.     Objective  BP 158/98  Pulse 68  Temp(Src) 97.6 F (36.4 C) (Oral)  Ht 5' 2.5" (1.588 m)  Wt 142 lb (64.411 kg)  BMI 25.54 kg/m2  SpO2 97%  Physical Exam  Physical Exam  Constitutional: She is oriented to person, place, and time and well-developed, well-nourished, and in no distress. No distress.  HENT:  Head: Normocephalic and atraumatic.  Eyes: Conjunctivae are normal.  Neck: Neck supple. No thyromegaly present.  Cardiovascular: Normal rate, regular rhythm and normal heart sounds.   No murmur heard. Pulmonary/Chest: Effort normal and breath sounds normal. She has no wheezes.  Abdominal: She exhibits no distension and no mass.  Musculoskeletal: She exhibits no edema.  Lymphadenopathy:    She has no cervical adenopathy.  Neurological: She is alert and oriented to person, place, and time.  Skin: Skin is warm and dry. No rash noted. She is not diaphoretic.  Psychiatric: Memory, affect and judgment normal.    Lab Results  Component Value Date   TSH 1.49 11/12/2012   Lab Results  Component Value Date   WBC 5.1 11/12/2012   HGB 13.0 11/12/2012   HCT 39.3 11/12/2012   MCV 96.6 11/12/2012   PLT 299.0 11/12/2012   Lab Results  Component Value Date   CREATININE 0.8 11/12/2012   BUN 19 11/12/2012   NA 139 11/12/2012   K 4.5 11/12/2012   CL 102 11/12/2012   CO2 32 11/12/2012   Lab Results  Component Value Date   ALT 14 11/12/2012   AST 20 11/12/2012   ALKPHOS 42 11/12/2012   BILITOT 1.0 11/12/2012   Lab Results  Component Value Date   CHOL 206* 11/12/2012   Lab Results  Component Value Date   HDL 56.00 11/12/2012   Lab Results  Component Value Date   LDLCALC 132* 11/12/2012   Lab Results  Component Value Date   TRIG 90.0  11/12/2012   TRIG 90 11/12/2012   Lab Results  Component Value Date   CHOLHDL 4 11/12/2012     Assessment & Plan  ELEVATED BP READING WITHOUT DX HYPERTENSION Lisinopril 10 mg daily, encouraged DASH diet and increased exercise.

## 2012-12-24 ENCOUNTER — Ambulatory Visit: Payer: Medicare Other | Admitting: Family Medicine

## 2012-12-27 ENCOUNTER — Telehealth: Payer: Self-pay | Admitting: Family Medicine

## 2012-12-27 NOTE — Telephone Encounter (Signed)
I am happy to run this test for her and at the visit is fine my only concern is if her insurance will cover it. I have reviewed her chart and do not find a payable diagnosis. I can run it under need for immunization I just want her to be aware they may say no.

## 2012-12-27 NOTE — Telephone Encounter (Signed)
LMOM with contact name and number for return call RE: Hep titer inquiryand further provider instructions/SLS 03.14.14

## 2012-12-27 NOTE — Telephone Encounter (Signed)
She has had 3 hep shots.  Was told she should be tested again  She is coming in two weeks for a nurse visit for bp should she have her blood take previous to this appointment or at that appointment

## 2012-12-27 NOTE — Telephone Encounter (Signed)
LMOM [2nd] with contact name and number for return call RE:  Inquiry and further provider instruction/SLS

## 2013-01-07 NOTE — Telephone Encounter (Signed)
Patient states she has a grandchild from [place] (adopted) that is hep B. Pt is going to contact her insurance company and then let us know when she comes in for her appt if she wants to continue with this testing.

## 2013-01-07 NOTE — Telephone Encounter (Signed)
Left a message on home answering machine to return my call

## 2013-01-23 ENCOUNTER — Ambulatory Visit (INDEPENDENT_AMBULATORY_CARE_PROVIDER_SITE_OTHER): Payer: Medicare Other | Admitting: *Deleted

## 2013-01-23 VITALS — BP 122/78 | HR 78

## 2013-01-23 DIAGNOSIS — R030 Elevated blood-pressure reading, without diagnosis of hypertension: Secondary | ICD-10-CM

## 2013-01-23 NOTE — Progress Notes (Signed)
  Subjective:    Patient ID: Mandy [name], female    DOB: [birth year], 67 y.o.   MRN: [id number]  HPI    Review of Systems     Objective:   Physical Exam        Assessment & Plan:  Patient came in today to get her BP checked

## 2013-02-11 ENCOUNTER — Other Ambulatory Visit: Payer: Self-pay | Admitting: *Deleted

## 2013-02-11 DIAGNOSIS — L309 Dermatitis, unspecified: Secondary | ICD-10-CM

## 2013-02-11 NOTE — Telephone Encounter (Signed)
error 

## 2013-02-12 ENCOUNTER — Other Ambulatory Visit: Payer: Self-pay

## 2013-02-12 DIAGNOSIS — L309 Dermatitis, unspecified: Secondary | ICD-10-CM

## 2013-02-12 MED ORDER — TRIAMCINOLONE ACETONIDE 0.1 % EX OINT: TOPICAL_OINTMENT | CUTANEOUS | Status: DC

## 2013-02-12 NOTE — Telephone Encounter (Signed)
RX sent for Triamcinolone.

## 2013-03-16 ENCOUNTER — Encounter: Payer: Self-pay | Admitting: Family Medicine

## 2013-03-17 ENCOUNTER — Telehealth: Payer: Self-pay

## 2013-03-17 MED ORDER — LOSARTAN POTASSIUM 25 MG PO TABS: 25.0000 mg | ORAL_TABLET | Freq: Every day | ORAL | Status: DC

## 2013-03-17 NOTE — Telephone Encounter (Signed)
Message copied by Court Joy on Mon Mar 17, 2013  2:02 PM ------      Message from: Danise Edge A      Created: Mon Mar 17, 2013  1:14 PM      Regarding: FW: Your message may not be read      Contact: [phone]       Please d/c Lisinopril, start Losartan 25 mg daily #30 with 2 rf, let patient know. appt in 1-2 mn      ----- Message -----         From: Generic Mychart         Sent: 03/17/2013   1:12 PM           To: Bradd Canary, MD      Subject: Your message may not be read                                ----- Delivery failure of internet email alert          ----- Failed to log attempted delivery of internet email alert       Tickler type: Message      Message Id(WMG): [id number]      SMTP Response: 410      Patient: [name],Mandy J([id number])      Recipient: ()      Internet alert email: [email or web address]               ----- Original WMG message to the patient -----      Sent: 03/17/2013  1:10 PM      From: Bradd Canary, MD      To: Groh,Rhanda P      Message Type: Patient Medical Advice Request      Subject: RE: Non-Urgent Medical Question      So unfortunately that cough will likely linger unless we do something. We should d/c the Lisinopril and switch you to Losartan 25 mg daily. I will let my nurse know. Then we need a bp check in next 1-2 months            Dr Abner Greenspan            ----- Message -----         From: Lawson Radar         Sent: 03/16/2013  8:13 PM EDT           To: Danise Edge, MD      Subject: Non-Urgent Medical Question            Dr Rogelia Rohrer,      When you prescribed blood pressure meds for me you asked that I let you know if I developed a cough.  I have - it's not a constant cough but it is somewhat annoying.  The meds have worked brilliantly except for this minor side effect.      Please advise if I should continue with the current meds or would you prefer me to try a different one.        Thank you,      Mandy [name]      DOB 05-05-2046       ------

## 2013-04-24 ENCOUNTER — Ambulatory Visit: Payer: Medicare Other | Admitting: Family Medicine

## 2013-05-02 ENCOUNTER — Encounter: Payer: Self-pay | Admitting: Family Medicine

## 2013-05-02 ENCOUNTER — Ambulatory Visit (INDEPENDENT_AMBULATORY_CARE_PROVIDER_SITE_OTHER): Payer: Medicare Other | Admitting: Family Medicine

## 2013-05-02 VITALS — BP 110/80 | HR 98 | Temp 98.3°F | Ht 62.5 in | Wt 134.1 lb

## 2013-05-02 DIAGNOSIS — M899 Disorder of bone, unspecified: Secondary | ICD-10-CM

## 2013-05-02 DIAGNOSIS — B351 Tinea unguium: Secondary | ICD-10-CM

## 2013-05-02 DIAGNOSIS — R030 Elevated blood-pressure reading, without diagnosis of hypertension: Secondary | ICD-10-CM

## 2013-05-02 DIAGNOSIS — M8580 Other specified disorders of bone density and structure, unspecified site: Secondary | ICD-10-CM

## 2013-05-02 DIAGNOSIS — N952 Postmenopausal atrophic vaginitis: Secondary | ICD-10-CM

## 2013-05-02 DIAGNOSIS — E559 Vitamin D deficiency, unspecified: Secondary | ICD-10-CM

## 2013-05-02 DIAGNOSIS — E782 Mixed hyperlipidemia: Secondary | ICD-10-CM

## 2013-05-02 DIAGNOSIS — M949 Disorder of cartilage, unspecified: Secondary | ICD-10-CM

## 2013-05-02 LAB — CBC
Hemoglobin: 14.2 g/dL (ref 12.0–15.0)
Platelets: 341 10*3/uL (ref 150–400)
RBC: 4.37 MIL/uL (ref 3.87–5.11)
WBC: 4.5 10*3/uL (ref 4.0–10.5)

## 2013-05-02 LAB — HEPATIC FUNCTION PANEL
ALT: 13 U/L (ref 0–35)
Albumin: 4.1 g/dL (ref 3.5–5.2)
Indirect Bilirubin: 0.7 mg/dL (ref 0.0–0.9)
Total Protein: 6.4 g/dL (ref 6.0–8.3)

## 2013-05-02 LAB — RENAL FUNCTION PANEL
Calcium: 9.8 mg/dL (ref 8.4–10.5)
Chloride: 99 mEq/L (ref 96–112)
Phosphorus: 4.8 mg/dL — ABNORMAL HIGH (ref 2.3–4.6)
Potassium: 5.4 mEq/L — ABNORMAL HIGH (ref 3.5–5.3)
Sodium: 136 mEq/L (ref 135–145)

## 2013-05-02 LAB — LIPID PANEL
Cholesterol: 216 mg/dL — ABNORMAL HIGH (ref 0–200)
Total CHOL/HDL Ratio: 3.4 Ratio
Triglycerides: 74 mg/dL (ref <150)
VLDL: 15 mg/dL (ref 0–40)

## 2013-05-02 NOTE — Assessment & Plan Note (Signed)
Improving with conservative measures, can consider lavendar oil qhs to nails

## 2013-05-02 NOTE — Assessment & Plan Note (Signed)
Patient very active, continue same, check vitamin d level densitometry ordered for August

## 2013-05-02 NOTE — Assessment & Plan Note (Signed)
Well controlled today No changes 

## 2013-05-02 NOTE — Patient Instructions (Addendum)
Rel of rec [facility]GS past 5 years  Can titrate Premarin down to fewest doses that are effective  Cholesterol Cholesterol is a white, waxy, fat-like protein needed by your body in small amounts. The liver makes all the cholesterol you need. It is carried from the liver by the blood through the blood vessels. Deposits (plaque) may build up on blood vessel walls. This makes the arteries narrower and stiffer. Plaque increases the risk for heart attack and stroke. You cannot feel your cholesterol level even if it is very high. The only way to know is by a blood test to check your lipid (fats) levels. Once you know your cholesterol levels, you should keep a record of the test results. Work with your caregiver to to keep your levels in the desired range. WHAT THE RESULTS MEAN:  Total cholesterol is a rough measure of all the cholesterol in your blood.  LDL is the so-called bad cholesterol. This is the type that deposits cholesterol in the walls of the arteries. You want this level to be low.  HDL is the good cholesterol because it cleans the arteries and carries the LDL away. You want this level to be high.  Triglycerides are fat that the body can either burn for energy or store. High levels are closely linked to heart disease. DESIRED LEVELS:  Total cholesterol below 200.  LDL below 100 for people at risk, below 70 for very high risk.  HDL above 50 is good, above 60 is best.  Triglycerides below 150. HOW TO LOWER YOUR CHOLESTEROL:  Diet.  Choose fish or white meat chicken and [place], roasted or baked. Limit fatty cuts of red meat, fried foods, and processed meats, such as sausage and lunch meat.  Eat lots of fresh fruits and vegetables. Choose whole grains, beans, pasta, potatoes and cereals.  Use only small amounts of olive, corn or canola oils. Avoid butter, mayonnaise, shortening or palm kernel oils. Avoid foods with trans-fats.  Use skim/nonfat milk and low-fat/nonfat yogurt and  cheeses. Avoid whole milk, cream, ice cream, egg yolks and cheeses. Healthy desserts include angel food cake, ginger snaps, animal crackers, hard candy, popsicles, and low-fat/nonfat frozen yogurt. Avoid pastries, cakes, pies and cookies.  Exercise.  A regular program helps decrease LDL and raises HDL.  Helps with weight control.  Do things that increase your activity level like gardening, walking, or taking the stairs.  Medication.  May be prescribed by your caregiver to help lowering cholesterol and the risk for heart disease.  You may need medicine even if your levels are normal if you have several risk factors. HOME CARE INSTRUCTIONS   Follow your diet and exercise programs as suggested by your caregiver.  Take medications as directed.  Have blood work done when your caregiver feels it is necessary. MAKE SURE YOU:   Understand these instructions.  Will watch your condition.  Will get help right away if you are not doing well or get worse. Document Released: 06/27/2001 Document Revised: 12/25/2011 Document Reviewed: 12/18/2007 [facility] Patient Information 2014 [place], [place].

## 2013-05-02 NOTE — Assessment & Plan Note (Signed)
Premarin cream 3 x a week helpful but is encouraged to titrate down to lowest effective dose.

## 2013-05-02 NOTE — Progress Notes (Signed)
Patient ID: Mandy [name], female   DOB: Oct 23, 1945, 67 y.o.   MRN: [id number] Mandy [name] [id number] May 01, 1946 05/02/2013      Progress Note-Follow Up  Subjective  Chief Complaint  Chief Complaint  Patient presents with  . Annual Exam    physical    HPI  Patient is a 67 year old Caucasian female who is in today for routine medical care. She feels well. She's not had any recent illness. He denies any fevers or chills. She denies any headache, chest pain, palpitations, shortness of breath, GI or GU complaints. She is taking numerous over-the-counter supplements take very little prescription medications. Premarin cream is helping her atrophic vaginitis. She maintains good physical activity and a heart healthy diet  Past Medical History  Diagnosis Date  . Osteopenia 10/17/1999  . Chicken pox as a child  . Measles as a child  . Miscarriage 1981  . Screening for malignant neoplasm of the cervix 04/26/2011  . Dermatitis 04/26/2011  . Onychomycosis 04/26/2011  . Atrophic vaginitis 11/18/2012    Past Surgical History  Procedure Laterality Date  . Dilation and curettage of uterus  1981    miscarriage    Family History  Problem Relation Age of Onset  . Osteoporosis Mother   . Coronary artery disease Mother   . Heart attack Mother 66  . Hypertension Mother   . Diabetes Father     type 2  . Heart disease Father   . Stroke Father   . Dementia Maternal Grandmother   . Asthma Maternal Grandfather   . Cancer Paternal Grandmother     ovarian  . Diabetes Paternal Grandfather     tyoe 2  . Hearing loss Paternal Grandfather   . Arthritis Other   . Hyperlipidemia Other   . Hypertension Other   . Other Other     Cardiovascular disorder    History   Social History  . Marital Status: Married    Spouse Name: N/A    Number of Children: N/A  . Years of Education: N/A   Occupational History  . bookkeeper    Social History Main Topics  . Smoking status: Former Smoker -- 1.00  packs/day    Types: Cigarettes    Quit date: 10/16/2000  . Smokeless tobacco: Never Used  . Alcohol Use: No  . Drug Use: No  . Sexually Active: Yes -- Female partner(s)   Other Topics Concern  . Not on file   Social History Narrative   Regular exercise- yes   Does Yoga 3-4 X week, personal trainer 1 X week, and walks regularly   Has lost 16 pounds by minimizing processed foods and meats    Current Outpatient Prescriptions on File Prior to Visit  Medication Sig Dispense Refill  . aspirin 81 MG tablet Take 81 mg by mouth daily.      Marland Kitchen b complex vitamins tablet Take 1 tablet by mouth daily.      . calcium citrate-vitamin D (CITRACAL+D) 315-200 MG-UNIT per tablet Take 1 tablet by mouth 2 (two) times daily.        Marland Kitchen conjugated estrogens (PREMARIN) vaginal cream Apply small amount PV 3 x weekly qhs  42.5 g  3  . Krill Oil CAPS 1 cap po qd MegaRed      . losartan (COZAAR) 25 MG tablet Take 1 tablet (25 mg total) by mouth daily.  30 tablet  2  . multivitamin (THERAGRAN) per tablet Take 1 tablet by mouth daily.        Marland Kitchen  mupirocin nasal ointment (BACTROBAN) 2 % Place into the nose at bedtime. Use one-half of tube in each nostril twice daily for ten (10) days. After application, press sides of nose together and gently massage.  10 g  0  . psyllium (METAMUCIL) 0.52 G capsule Take 2.6 g by mouth daily. 5 capsules      . triamcinolone ointment (KENALOG) 0.1 % Apply to hands qhs prn dermatitis and place  On damp gloves  30 g  2  . vitamin C (ASCORBIC ACID) 500 MG tablet Take 500 mg by mouth daily.       No current facility-administered medications on file prior to visit.    Allergies  Allergen Reactions  . Codeine   . Pneumococcal Vaccine Polyvalent     REACTION: rash at injection site and induration.    Review of Systems  Review of Systems  Constitutional: Negative for fever and malaise/fatigue.  HENT: Negative for congestion.   Eyes: Negative for pain and discharge.  Respiratory:  Negative for shortness of breath.   Cardiovascular: Negative for chest pain, palpitations and leg swelling.  Gastrointestinal: Negative for nausea, abdominal pain and diarrhea.  Genitourinary: Negative for dysuria.  Musculoskeletal: Negative for falls.  Skin: Negative for rash.  Neurological: Negative for loss of consciousness and headaches.  Endo/Heme/Allergies: Negative for polydipsia.  Psychiatric/Behavioral: Negative for depression and suicidal ideas. The patient is not nervous/anxious and does not have insomnia.     Objective  BP 110/80  Pulse 98  Temp(Src) 98.3 F (36.8 C) (Oral)  Ht 5' 2.5" (1.588 m)  Wt 134 lb 1.3 oz (60.818 kg)  BMI 24.12 kg/m2  SpO2 98%  Physical Exam  Physical Exam  Constitutional: She is oriented to person, place, and time and well-developed, well-nourished, and in no distress. No distress.  HENT:  Head: Normocephalic and atraumatic.  Right Ear: External ear normal.  Left Ear: External ear normal.  Nose: Nose normal.  Mouth/Throat: Oropharynx is clear and moist. No oropharyngeal exudate.  Eyes: Conjunctivae are normal. Pupils are equal, round, and reactive to light. Right eye exhibits no discharge. Left eye exhibits no discharge. No scleral icterus.  Neck: Normal range of motion. Neck supple. No thyromegaly present.  Cardiovascular: Normal rate, regular rhythm, normal heart sounds and intact distal pulses.   No murmur heard. Pulmonary/Chest: Effort normal and breath sounds normal. No respiratory distress. She has no wheezes. She has no rales.  Abdominal: Soft. Bowel sounds are normal. She exhibits no distension and no mass. There is no tenderness.  Musculoskeletal: Normal range of motion. She exhibits no edema and no tenderness.  Lymphadenopathy:    She has no cervical adenopathy.  Neurological: She is alert and oriented to person, place, and time. She has normal reflexes. No cranial nerve deficit. Coordination normal.  Skin: Skin is warm and  dry. No rash noted. She is not diaphoretic.  Psychiatric: Mood, memory and affect normal.    Lab Results  Component Value Date   TSH 1.49 11/12/2012   Lab Results  Component Value Date   WBC 5.1 11/12/2012   HGB 13.0 11/12/2012   HCT 39.3 11/12/2012   MCV 96.6 11/12/2012   PLT 299.0 11/12/2012   Lab Results  Component Value Date   CREATININE 0.8 11/12/2012   BUN 19 11/12/2012   NA 139 11/12/2012   K 4.5 11/12/2012   CL 102 11/12/2012   CO2 32 11/12/2012   Lab Results  Component Value Date   ALT 14 11/12/2012  AST 20 11/12/2012   ALKPHOS 42 11/12/2012   BILITOT 1.0 11/12/2012   Lab Results  Component Value Date   CHOL 206* 11/12/2012   Lab Results  Component Value Date   HDL 56.00 11/12/2012   Lab Results  Component Value Date   LDLCALC 132* 11/12/2012   Lab Results  Component Value Date   TRIG 90.0 11/12/2012   TRIG 90 11/12/2012   Lab Results  Component Value Date   CHOLHDL 4 11/12/2012     Assessment & Plan  ELEVATED BP READING WITHOUT DX HYPERTENSION Well controlled today. No changes   UNSPECIFIED VITAMIN D DEFICIENCY Check level today, continue calcium with vitamin d supplements.  OSTEOPENIA Patient very active, continue same, check vitamin d level densitometry ordered for August  Onychomycosis Improving with conservative measures, can consider lavendar oil qhs to nails  Atrophic vaginitis Premarin cream 3 x a week helpful but is encouraged to titrate down to lowest effective dose.  Mixed hyperlipidemia Continue krill oil, recheck panel, avoid trans fats, continue regular exercise

## 2013-05-02 NOTE — Assessment & Plan Note (Signed)
Check level today, continue calcium with vitamin d supplements.

## 2013-05-02 NOTE — Assessment & Plan Note (Signed)
Continue krill oil, recheck panel, avoid trans fats, continue regular exercise

## 2013-05-03 ENCOUNTER — Encounter: Payer: Self-pay | Admitting: Family Medicine

## 2013-05-03 LAB — VITAMIN D 25 HYDROXY (VIT D DEFICIENCY, FRACTURES): Vit D, 25-Hydroxy: 59 ng/mL (ref 30–89)

## 2013-05-03 LAB — TSH: TSH: 2.244 u[IU]/mL (ref 0.350–4.500)

## 2013-05-05 NOTE — Progress Notes (Signed)
Lab order placed.

## 2013-05-05 NOTE — Telephone Encounter (Signed)
Please advise 

## 2013-05-05 NOTE — Addendum Note (Signed)
Addended by: Court Joy on: 05/05/2013 11:25 AM   Modules accepted: Orders

## 2013-05-09 LAB — RENAL FUNCTION PANEL
Albumin: 3.7 g/dL (ref 3.5–5.2)
Calcium: 9.4 mg/dL (ref 8.4–10.5)
Potassium: 5.1 mEq/L (ref 3.5–5.3)
Sodium: 133 mEq/L — ABNORMAL LOW (ref 135–145)

## 2013-05-16 ENCOUNTER — Other Ambulatory Visit: Payer: Medicare Other

## 2013-05-21 ENCOUNTER — Other Ambulatory Visit: Payer: Self-pay

## 2013-06-11 LAB — HM PAP SMEAR: HM Pap smear: NEGATIVE

## 2013-06-13 ENCOUNTER — Telehealth: Payer: Self-pay | Admitting: *Deleted

## 2013-06-13 MED ORDER — LOSARTAN POTASSIUM 25 MG PO TABS: 25.0000 mg | ORAL_TABLET | Freq: Every day | ORAL | Status: DC

## 2013-06-13 NOTE — Telephone Encounter (Signed)
Received fax from [facility] for refill of losartan. Refills sent.

## 2013-06-20 ENCOUNTER — Encounter: Payer: Self-pay | Admitting: Family Medicine

## 2013-06-23 NOTE — Telephone Encounter (Signed)
No results in Epic yet.

## 2013-06-24 ENCOUNTER — Encounter: Payer: Self-pay | Admitting: *Deleted

## 2013-06-27 ENCOUNTER — Encounter: Payer: Self-pay | Admitting: Family Medicine

## 2013-07-07 ENCOUNTER — Encounter: Payer: Self-pay | Admitting: Family Medicine

## 2013-07-23 ENCOUNTER — Ambulatory Visit (INDEPENDENT_AMBULATORY_CARE_PROVIDER_SITE_OTHER): Payer: Medicare Other

## 2013-07-23 DIAGNOSIS — Z23 Encounter for immunization: Secondary | ICD-10-CM

## 2013-10-30 ENCOUNTER — Encounter: Payer: Self-pay | Admitting: Family Medicine

## 2013-10-30 ENCOUNTER — Ambulatory Visit (INDEPENDENT_AMBULATORY_CARE_PROVIDER_SITE_OTHER): Payer: Managed Care, Other (non HMO) | Admitting: Family Medicine

## 2013-10-30 VITALS — BP 148/82 | HR 74 | Temp 98.4°F | Ht 62.5 in | Wt 143.0 lb

## 2013-10-30 DIAGNOSIS — E782 Mixed hyperlipidemia: Secondary | ICD-10-CM

## 2013-10-30 DIAGNOSIS — Z23 Encounter for immunization: Secondary | ICD-10-CM

## 2013-10-30 DIAGNOSIS — R030 Elevated blood-pressure reading, without diagnosis of hypertension: Secondary | ICD-10-CM

## 2013-10-30 DIAGNOSIS — E871 Hypo-osmolality and hyponatremia: Secondary | ICD-10-CM

## 2013-10-30 LAB — RENAL FUNCTION PANEL
Albumin: 4.1 g/dL (ref 3.5–5.2)
BUN: 16 mg/dL (ref 6–23)
CHLORIDE: 101 meq/L (ref 96–112)
CO2: 31 meq/L (ref 19–32)
CREATININE: 0.82 mg/dL (ref 0.50–1.10)
Calcium: 9.7 mg/dL (ref 8.4–10.5)
GLUCOSE: 85 mg/dL (ref 70–99)
Phosphorus: 3.6 mg/dL (ref 2.3–4.6)
Potassium: 5.2 mEq/L (ref 3.5–5.3)
Sodium: 138 mEq/L (ref 135–145)

## 2013-10-30 NOTE — Patient Instructions (Signed)
DASH Diet  The DASH diet stands for "Dietary Approaches to Stop Hypertension." It is a healthy eating plan that has been shown to reduce high blood pressure (hypertension) in as little as 14 days, while also possibly providing other significant health benefits. These other health benefits include reducing the risk of breast cancer after menopause and reducing the risk of type 2 diabetes, heart disease, colon cancer, and stroke. Health benefits also include weight loss and slowing kidney failure in patients with chronic kidney disease.   DIET GUIDELINES  · Limit salt (sodium). Your diet should contain less than 1500 mg of sodium daily.  · Limit refined or processed carbohydrates. Your diet should include mostly whole grains. Desserts and added sugars should be used sparingly.  · Include small amounts of heart-healthy fats. These types of fats include nuts, oils, and tub margarine. Limit saturated and trans fats. These fats have been shown to be harmful in the body.  CHOOSING FOODS   The following food groups are based on a 2000 calorie diet. See your Registered Dietitian for individual calorie needs.  Grains and Grain Products (6 to 8 servings daily)  · Eat More Often: Whole-wheat bread, brown rice, whole-grain or wheat pasta, quinoa, popcorn without added fat or salt (air popped).  · Eat Less Often: White bread, white pasta, white rice, cornbread.  Vegetables (4 to 5 servings daily)  · Eat More Often: Fresh, frozen, and canned vegetables. Vegetables may be raw, steamed, roasted, or grilled with a minimal amount of fat.  · Eat Less Often/Avoid: Creamed or fried vegetables. Vegetables in a cheese sauce.  Fruit (4 to 5 servings daily)  · Eat More Often: All fresh, canned (in natural juice), or frozen fruits. Dried fruits without added sugar. One hundred percent fruit juice (½ cup [237 mL] daily).  · Eat Less Often: Dried fruits with added sugar. Canned fruit in light or heavy syrup.  Lean Meats, Fish, and Poultry (2  servings or less daily. One serving is 3 to 4 oz [85-114 g]).  · Eat More Often: Ninety percent or leaner ground beef, tenderloin, sirloin. Round cuts of beef, chicken breast, turkey breast. All fish. Grill, bake, or broil your meat. Nothing should be fried.  · Eat Less Often/Avoid: Fatty cuts of meat, turkey, or chicken leg, thigh, or wing. Fried cuts of meat or fish.  Dairy (2 to 3 servings)  · Eat More Often: Low-fat or fat-free milk, low-fat plain or light yogurt, reduced-fat or part-skim cheese.  · Eat Less Often/Avoid: Milk (whole, 2%). Whole milk yogurt. Full-fat cheeses.  Nuts, Seeds, and Legumes (4 to 5 servings per week)  · Eat More Often: All without added salt.  · Eat Less Often/Avoid: Salted nuts and seeds, canned beans with added salt.  Fats and Sweets (limited)  · Eat More Often: Vegetable oils, tub margarines without trans fats, sugar-free gelatin. Mayonnaise and salad dressings.  · Eat Less Often/Avoid: Coconut oils, palm oils, butter, stick margarine, cream, half and half, cookies, candy, pie.  FOR MORE INFORMATION  The Dash Diet Eating Plan: www.dashdiet.org  Document Released: 09/21/2011 Document Revised: 12/25/2011 Document Reviewed: 09/21/2011  [employer]® Patient Information ©2014 [employer].

## 2013-10-30 NOTE — Progress Notes (Signed)
Pre visit review using our clinic review tool, if applicable. No additional management support is needed unless otherwise documented below in the visit note. 

## 2013-10-30 NOTE — Progress Notes (Signed)
Patient ID: Mandy [name], female   DOB: [birth year], 68 y.o.   MRN: [id number] Mandy [name] [id number] [birth year] 10/30/2013      Progress Note-Follow Up  Subjective  Chief Complaint  Chief Complaint  Patient presents with  . Follow-up    6 month    HPI  Patient his 34 he female who is in today for followup. She feels well. She's been following with physical therapy for her right arm pain and has resolved. She was doing Weight Watchers and exercising and has lost a great deal of weight but has stopped her weight has come. No recent illness. No chest pain, palpitations or shortness of breath. No GI or GU complaints  Past Medical History  Diagnosis Date  . Osteopenia 10/17/1999  . Chicken pox as a child  . Measles as a child  . Miscarriage 1981  . Screening for malignant neoplasm of the cervix 04/26/2011  . Dermatitis 04/26/2011  . Onychomycosis 04/26/2011  . Atrophic vaginitis 11/18/2012    Past Surgical History  Procedure Laterality Date  . Dilation and curettage of uterus  1981    miscarriage    Family History  Problem Relation Age of Onset  . Osteoporosis Mother   . Coronary artery disease Mother   . Heart attack Mother 8  . Hypertension Mother   . Diabetes Father     type 2  . Heart disease Father   . Stroke Father   . Dementia Maternal Grandmother   . Asthma Maternal Grandfather   . Cancer Paternal Grandmother     ovarian  . Diabetes Paternal Grandfather     tyoe 2  . Hearing loss Paternal Grandfather   . Arthritis Other   . Hyperlipidemia Other   . Hypertension Other   . Other Other     Cardiovascular disorder    History   Social History  . Marital Status: Married    Spouse Name: N/A    Number of Children: N/A  . Years of Education: N/A   Occupational History  . bookkeeper    Social History Main Topics  . Smoking status: Former Smoker -- 1.00 packs/day    Types: Cigarettes    Quit date: 10/16/2000  . Smokeless tobacco: Never Used  . Alcohol Use:  No  . Drug Use: No  . Sexual Activity: Yes    Partners: Male   Other Topics Concern  . Not on file   Social History Narrative   Regular exercise- yes   Does Yoga 3-4 X week, personal trainer 1 X week, and walks regularly   Has lost 16 pounds by minimizing processed foods and meats    Current Outpatient Prescriptions on File Prior to Visit  Medication Sig Dispense Refill  . aspirin 81 MG tablet Take 81 mg by mouth daily.      Marland Kitchen b complex vitamins tablet Take 1 tablet by mouth daily.      . calcium citrate-vitamin D (CITRACAL+D) 315-200 MG-UNIT per tablet Take 1 tablet by mouth 2 (two) times daily.        Marland Kitchen conjugated estrogens (PREMARIN) vaginal cream Apply small amount PV 3 x weekly qhs  42.5 g  3  . Krill Oil CAPS 1 cap po qd MegaRed      . losartan (COZAAR) 25 MG tablet Take 1 tablet (25 mg total) by mouth daily.  30 tablet  5  . multivitamin (THERAGRAN) per tablet Take 1 tablet by mouth daily.        Marland Kitchen  mupirocin nasal ointment (BACTROBAN) 2 % Place into the nose at bedtime. Use one-half of tube in each nostril twice daily for ten (10) days. After application, press sides of nose together and gently massage.  10 g  0  . psyllium (METAMUCIL) 0.52 G capsule Take 2.6 g by mouth daily. 5 capsules      . triamcinolone ointment (KENALOG) 0.1 % Apply to hands qhs prn dermatitis and place  On damp gloves  30 g  2  . vitamin C (ASCORBIC ACID) 500 MG tablet Take 500 mg by mouth daily.       No current facility-administered medications on file prior to visit.    Allergies  Allergen Reactions  . Codeine   . Pneumococcal Vaccine Polyvalent     REACTION: rash at injection site and induration.    Review of Systems  Review of Systems  Constitutional: Negative for fever and malaise/fatigue.  HENT: Negative for congestion.   Eyes: Negative for discharge.  Respiratory: Negative for shortness of breath.   Cardiovascular: Negative for chest pain, palpitations and leg swelling.   Gastrointestinal: Negative for nausea, abdominal pain and diarrhea.  Genitourinary: Negative for dysuria.  Musculoskeletal: Negative for falls.  Skin: Negative for rash.  Neurological: Negative for loss of consciousness and headaches.  Endo/Heme/Allergies: Negative for polydipsia.  Psychiatric/Behavioral: Negative for depression and suicidal ideas. The patient is not nervous/anxious and does not have insomnia.     Objective  BP 148/82  Pulse 74  Temp(Src) 98.4 F (36.9 C) (Oral)  Ht 5' 2.5" (1.588 m)  Wt 143 lb (64.864 kg)  BMI 25.72 kg/m2  SpO2 99%  Physical Exam   Physical Exam  Constitutional: She is oriented to person, place, and time and well-developed, well-nourished, and in no distress. No distress.  HENT:  Head: Normocephalic and atraumatic.  Eyes: Conjunctivae are normal.  Neck: Neck supple. No thyromegaly present.  Cardiovascular: Normal rate, regular rhythm and normal heart sounds.   No murmur heard. Pulmonary/Chest: Effort normal and breath sounds normal. She has no wheezes.  Abdominal: She exhibits no distension and no mass.  Musculoskeletal: She exhibits no edema.  Lymphadenopathy:    She has no cervical adenopathy.  Neurological: She is alert and oriented to person, place, and time.  Skin: Skin is warm and dry. No rash noted. She is not diaphoretic.  Psychiatric: Memory, affect and judgment normal.    Lab Results  Component Value Date   TSH 2.244 05/02/2013   Lab Results  Component Value Date   WBC 4.5 05/02/2013   HGB 14.2 05/02/2013   HCT 42.3 05/02/2013   MCV 96.8 05/02/2013   PLT 341 05/02/2013   Lab Results  Component Value Date   CREATININE 0.93 05/09/2013   BUN 23 05/09/2013   NA 133* 05/09/2013   K 5.1 05/09/2013   CL 99 05/09/2013   CO2 29 05/09/2013   Lab Results  Component Value Date   ALT 13 05/02/2013   AST 17 05/02/2013   ALKPHOS 44 05/02/2013   BILITOT 0.9 05/02/2013   Lab Results  Component Value Date   CHOL 216* 05/02/2013    Lab Results  Component Value Date   HDL 63 05/02/2013   Lab Results  Component Value Date   LDLCALC 138* 05/02/2013   Lab Results  Component Value Date   TRIG 74 05/02/2013   Lab Results  Component Value Date   CHOLHDL 3.4 05/02/2013     Assessment & Plan  ELEVATED BP READING WITHOUT  DX HYPERTENSION Encouraged DASH diet, increase exercise and minimize sodium and caffeine.   Mixed hyperlipidemia Mild, maintain heart healthy diet, increase exercise, avoid trans fats, take krill oil caps daily

## 2013-11-02 NOTE — Assessment & Plan Note (Signed)
Mild, maintain heart healthy diet, increase exercise, avoid trans fats, take krill oil caps daily

## 2013-11-02 NOTE — Assessment & Plan Note (Signed)
Encouraged DASH diet, increase exercise and minimize sodium and caffeine.

## 2013-12-03 ENCOUNTER — Telehealth: Payer: Self-pay | Admitting: Family Medicine

## 2013-12-03 MED ORDER — LOSARTAN POTASSIUM 25 MG PO TABS: 25.0000 mg | ORAL_TABLET | Freq: Every day | ORAL | Status: DC

## 2013-12-03 NOTE — Telephone Encounter (Signed)
Refill losartan

## 2013-12-11 ENCOUNTER — Ambulatory Visit: Payer: Managed Care, Other (non HMO)

## 2013-12-18 ENCOUNTER — Encounter: Payer: Self-pay | Admitting: Family Medicine

## 2013-12-18 ENCOUNTER — Ambulatory Visit (INDEPENDENT_AMBULATORY_CARE_PROVIDER_SITE_OTHER): Payer: Managed Care, Other (non HMO) | Admitting: Family Medicine

## 2013-12-18 VITALS — BP 144/92 | HR 59 | Temp 97.7°F | Ht 62.5 in | Wt 146.0 lb

## 2013-12-18 DIAGNOSIS — I10 Essential (primary) hypertension: Secondary | ICD-10-CM

## 2013-12-18 MED ORDER — LOSARTAN POTASSIUM 50 MG PO TABS: 50.0000 mg | ORAL_TABLET | Freq: Every day | ORAL | Status: DC

## 2013-12-18 NOTE — Progress Notes (Signed)
Pre visit review using our clinic review tool, if applicable. No additional management support is needed unless otherwise documented below in the visit note. 

## 2013-12-18 NOTE — Patient Instructions (Signed)

## 2013-12-18 NOTE — Progress Notes (Signed)
Patient ID: Mandy [name], female   DOB: 20-Oct-1945, 68 y.o.   MRN: [id number] Mandy [name] [id number] [birth year] 12/18/2013      Progress Note-Follow Up  Subjective  Chief Complaint  Chief Complaint  Patient presents with  . Follow-up    6 week on BP    HPI  A 68 year old Caucasian female who is in today for followup on her blood pressure. She feels well. She denies any recent illness. She denies congestion, headache, chest pain, palpitations, shortness of breath, GI concerns. Has tolerated olmesartan. Has been checking her blood pressure at home and her numbers typically running 734L to 937T systolic over 02I to 09B diastolic.   Past Medical History  Diagnosis Date  . Osteopenia 10/17/1999  . Chicken pox as a child  . Measles as a child  . Miscarriage 1981  . Screening for malignant neoplasm of the cervix 04/26/2011  . Dermatitis 04/26/2011  . Onychomycosis 04/26/2011  . Atrophic vaginitis 11/18/2012  . HTN (hypertension) 04/13/2010    Qualifier: Diagnosis of  By: Charlett Blake MD, Erline Levine      Past Surgical History  Procedure Laterality Date  . Dilation and curettage of uterus  1981    miscarriage    Family History  Problem Relation Age of Onset  . Osteoporosis Mother   . Coronary artery disease Mother   . Heart attack Mother 67  . Hypertension Mother   . Diabetes Father     type 2  . Heart disease Father   . Stroke Father   . Dementia Maternal Grandmother   . Asthma Maternal Grandfather   . Cancer Paternal Grandmother     ovarian  . Diabetes Paternal Grandfather     tyoe 2  . Hearing loss Paternal Grandfather   . Arthritis Other   . Hyperlipidemia Other   . Hypertension Other   . Other Other     Cardiovascular disorder    History   Social History  . Marital Status: Married    Spouse Name: N/A    Number of Children: N/A  . Years of Education: N/A   Occupational History  . bookkeeper    Social History Main Topics  . Smoking status: Former Smoker -- 1.00  packs/day    Types: Cigarettes    Quit date: 10/16/2000  . Smokeless tobacco: Never Used  . Alcohol Use: No  . Drug Use: No  . Sexual Activity: Yes    Partners: Male   Other Topics Concern  . Not on file   Social History Narrative   Regular exercise- yes   Does Yoga 3-4 X week, personal trainer 1 X week, and walks regularly   Has lost 16 pounds by minimizing processed foods and meats    Current Outpatient Prescriptions on File Prior to Visit  Medication Sig Dispense Refill  . aspirin 81 MG tablet Take 81 mg by mouth daily.      Marland Kitchen b complex vitamins tablet Take 1 tablet by mouth daily.      . calcium citrate-vitamin D (CITRACAL+D) 315-200 MG-UNIT per tablet Take 1 tablet by mouth 2 (two) times daily.        Marland Kitchen conjugated estrogens (PREMARIN) vaginal cream Apply small amount PV 3 x weekly qhs  42.5 g  3  . Krill Oil CAPS 1 cap po qd MegaRed      . multivitamin (THERAGRAN) per tablet Take 1 tablet by mouth daily.        . mupirocin nasal ointment (BACTROBAN)  2 % Place into the nose at bedtime. Use one-half of tube in each nostril twice daily for ten (10) days. After application, press sides of nose together and gently massage.  10 g  0  . psyllium (METAMUCIL) 0.52 G capsule Take 2.6 g by mouth daily. 5 capsules      . triamcinolone ointment (KENALOG) 0.1 % Apply to hands qhs prn dermatitis and place  On damp gloves  30 g  2  . vitamin C (ASCORBIC ACID) 500 MG tablet Take 500 mg by mouth daily.       No current facility-administered medications on file prior to visit.    Allergies  Allergen Reactions  . Codeine   . Pneumococcal Vaccine Polyvalent     REACTION: rash at injection site and induration.    Review of Systems  Review of Systems  Constitutional: Negative for fever and malaise/fatigue.  HENT: Negative for congestion.   Eyes: Negative for discharge.  Respiratory: Negative for shortness of breath.   Cardiovascular: Negative for chest pain, palpitations and leg  swelling.  Gastrointestinal: Negative for nausea, abdominal pain and diarrhea.  Genitourinary: Negative for dysuria.  Musculoskeletal: Negative for falls.  Skin: Negative for rash.  Neurological: Negative for loss of consciousness and headaches.  Endo/Heme/Allergies: Negative for polydipsia.  Psychiatric/Behavioral: Negative for depression and suicidal ideas. The patient is not nervous/anxious and does not have insomnia.     Objective  BP 144/92  Pulse 59  Temp(Src) 97.7 F (36.5 C) (Oral)  Ht 5' 2.5" (1.588 m)  Wt 146 lb 0.6 oz (66.243 kg)  BMI 26.27 kg/m2  SpO2 97%  Physical Exam  Physical Exam  Constitutional: She is oriented to person, place, and time and well-developed, well-nourished, and in no distress. No distress.  HENT:  Head: Normocephalic and atraumatic.  Eyes: Conjunctivae are normal.  Neck: Neck supple. No thyromegaly present.  Cardiovascular: Normal rate, regular rhythm and normal heart sounds.   No murmur heard. Pulmonary/Chest: Effort normal and breath sounds normal. She has no wheezes.  Abdominal: She exhibits no distension and no mass.  Musculoskeletal: She exhibits no edema.  Lymphadenopathy:    She has no cervical adenopathy.  Neurological: She is alert and oriented to person, place, and time.  Skin: Skin is warm and dry. No rash noted. She is not diaphoretic.  Psychiatric: Memory, affect and judgment normal.    Lab Results  Component Value Date   TSH 2.244 05/02/2013   Lab Results  Component Value Date   WBC 4.5 05/02/2013   HGB 14.2 05/02/2013   HCT 42.3 05/02/2013   MCV 96.8 05/02/2013   PLT 341 05/02/2013   Lab Results  Component Value Date   CREATININE 0.82 10/30/2013   BUN 16 10/30/2013   NA 138 10/30/2013   K 5.2 10/30/2013   CL 101 10/30/2013   CO2 31 10/30/2013   Lab Results  Component Value Date   ALT 13 05/02/2013   AST 17 05/02/2013   ALKPHOS 44 05/02/2013   BILITOT 0.9 05/02/2013   Lab Results  Component Value Date   CHOL 216*  05/02/2013   Lab Results  Component Value Date   HDL 63 05/02/2013   Lab Results  Component Value Date   LDLCALC 138* 05/02/2013   Lab Results  Component Value Date   TRIG 74 05/02/2013   Lab Results  Component Value Date   CHOLHDL 3.4 05/02/2013     Assessment & Plan  HTN (hypertension) Still elevated but improved slightly,  patient has cut down on caffeine and sodium. Encouraged increased exercise as the weather improves. Increase Losartan to 50 mg daily and reassess at next visit. Patient has home monitor. Encouraged to check a couple times a week and notify us if starts to run hi or low.

## 2013-12-18 NOTE — Assessment & Plan Note (Signed)
Still elevated but improved slightly, patient has cut down on caffeine and sodium. Encouraged increased exercise as the weather improves. Increase Losartan to 50 mg daily and reassess at next visit. Patient has home monitor. Encouraged to check a couple times a week and notify us if starts to run hi or low.

## 2013-12-19 ENCOUNTER — Telehealth: Payer: Self-pay | Admitting: Family Medicine

## 2013-12-19 NOTE — Telephone Encounter (Signed)
Relevant patient education assigned to patient using Emmi. ° °

## 2014-03-10 ENCOUNTER — Telehealth: Payer: Self-pay | Admitting: Family Medicine

## 2014-03-10 DIAGNOSIS — N952 Postmenopausal atrophic vaginitis: Secondary | ICD-10-CM

## 2014-03-10 MED ORDER — ESTROGENS, CONJUGATED 0.625 MG/GM VA CREA: TOPICAL_CREAM | VAGINAL | Status: DC

## 2014-03-10 NOTE — Telephone Encounter (Signed)
Refill- premarin vaginal cream  [place]

## 2014-04-23 ENCOUNTER — Telehealth: Payer: Self-pay | Admitting: Family Medicine

## 2014-04-23 DIAGNOSIS — I10 Essential (primary) hypertension: Secondary | ICD-10-CM

## 2014-04-23 DIAGNOSIS — L309 Dermatitis, unspecified: Secondary | ICD-10-CM

## 2014-04-23 MED ORDER — LOSARTAN POTASSIUM 50 MG PO TABS: 50.0000 mg | ORAL_TABLET | Freq: Every day | ORAL | Status: DC

## 2014-04-23 MED ORDER — TRIAMCINOLONE ACETONIDE 0.1 % EX OINT: TOPICAL_OINTMENT | CUTANEOUS | Status: DC

## 2014-04-23 NOTE — Telephone Encounter (Signed)
Refill- triamcinolone  [place]

## 2014-04-29 ENCOUNTER — Other Ambulatory Visit: Payer: Self-pay | Admitting: Dermatology

## 2014-05-07 ENCOUNTER — Encounter: Payer: Self-pay | Admitting: Family Medicine

## 2014-05-07 ENCOUNTER — Other Ambulatory Visit (HOSPITAL_COMMUNITY)
Admission: RE | Admit: 2014-05-07 | Discharge: 2014-05-07 | Disposition: A | Discharge status: Home / Self Care | Payer: Managed Care, Other (non HMO) | Source: Ambulatory Visit | Attending: Family Medicine | Admitting: Family Medicine

## 2014-05-07 ENCOUNTER — Ambulatory Visit (INDEPENDENT_AMBULATORY_CARE_PROVIDER_SITE_OTHER): Payer: Managed Care, Other (non HMO) | Admitting: Family Medicine

## 2014-05-07 VITALS — BP 104/74 | HR 64 | Temp 97.7°F | Ht 62.5 in | Wt 147.0 lb

## 2014-05-07 DIAGNOSIS — Z124 Encounter for screening for malignant neoplasm of cervix: Secondary | ICD-10-CM | POA: Insufficient documentation

## 2014-05-07 DIAGNOSIS — B353 Tinea pedis: Secondary | ICD-10-CM

## 2014-05-07 DIAGNOSIS — E785 Hyperlipidemia, unspecified: Secondary | ICD-10-CM

## 2014-05-07 DIAGNOSIS — I10 Essential (primary) hypertension: Secondary | ICD-10-CM

## 2014-05-07 DIAGNOSIS — E559 Vitamin D deficiency, unspecified: Secondary | ICD-10-CM

## 2014-05-07 DIAGNOSIS — Z0000 Encounter for general adult medical examination without abnormal findings: Secondary | ICD-10-CM

## 2014-05-07 DIAGNOSIS — E782 Mixed hyperlipidemia: Secondary | ICD-10-CM

## 2014-05-07 LAB — CBC
HCT: 39.6 % (ref 36.0–46.0)
HEMOGLOBIN: 13.3 g/dL (ref 12.0–15.0)
MCH: 32 pg (ref 26.0–34.0)
MCHC: 33.6 g/dL (ref 30.0–36.0)
MCV: 95.2 fL (ref 78.0–100.0)
PLATELETS: 310 10*3/uL (ref 150–400)
RBC: 4.16 MIL/uL (ref 3.87–5.11)
RDW: 13.2 % (ref 11.5–15.5)
WBC: 5 10*3/uL (ref 4.0–10.5)

## 2014-05-07 LAB — RENAL FUNCTION PANEL
Albumin: 3.8 g/dL (ref 3.5–5.2)
BUN: 19 mg/dL (ref 6–23)
CO2: 29 mEq/L (ref 19–32)
Calcium: 8.7 mg/dL (ref 8.4–10.5)
Chloride: 102 mEq/L (ref 96–112)
Creat: 0.77 mg/dL (ref 0.50–1.10)
Glucose, Bld: 89 mg/dL (ref 70–99)
Phosphorus: 4 mg/dL (ref 2.3–4.6)
Potassium: 3.8 mEq/L (ref 3.5–5.3)
Sodium: 138 mEq/L (ref 135–145)

## 2014-05-07 LAB — LIPID PANEL
Cholesterol: 200 mg/dL (ref 0–200)
HDL: 65 mg/dL (ref >39)
LDL Cholesterol: 119 mg/dL — ABNORMAL HIGH (ref 0–99)
Total CHOL/HDL Ratio: 3.1 Ratio
Triglycerides: 82 mg/dL (ref <150)
VLDL: 16 mg/dL (ref 0–40)

## 2014-05-07 LAB — HEPATIC FUNCTION PANEL
ALT: 10 U/L (ref 0–35)
AST: 17 U/L (ref 0–37)
Albumin: 3.8 g/dL (ref 3.5–5.2)
Alkaline Phosphatase: 37 U/L — ABNORMAL LOW (ref 39–117)
Bilirubin, Direct: 0.1 mg/dL (ref 0.0–0.3)
Indirect Bilirubin: 0.6 mg/dL (ref 0.2–1.2)
Total Bilirubin: 0.7 mg/dL (ref 0.2–1.2)
Total Protein: 6 g/dL (ref 6.0–8.3)

## 2014-05-07 NOTE — Assessment & Plan Note (Addendum)
Menarche at 11 Regular and moderate flow no history of abnormal pap in past G2P1, s/p 1 miscarriage with D/C and 1 svd No history of abnormal MGM No concerns today no gyn surgeries except D/C  Pap today

## 2014-05-07 NOTE — Progress Notes (Signed)
Pre visit review using our clinic review tool, if applicable. No additional management support is needed unless otherwise documented below in the visit note. 

## 2014-05-07 NOTE — Patient Instructions (Signed)

## 2014-05-07 NOTE — Progress Notes (Signed)
Patient ID: Mandy [name], female   DOB: 1946/07/23, 68 y.o.   MRN: [id number] Mandy [name] [id number] July 11, 1946 05/07/2014      Progress Note-Follow Up  Subjective  Chief Complaint  Chief Complaint  Patient presents with  . Annual Exam    HPI  Patient is a 68 year old female in today for routine medical care. She is in today for annual exam. She is doing well. No recent illness although she is being treated for tinea pedis and onychomycosis. She reports good diet and exercise patterns. She denies any GYN concerns. Denies CP/palp/SOB/HA/congestion/fevers/GI or GU c/o. Taking meds as prescribed  Past Medical History  Diagnosis Date  . Osteopenia 10/17/1999  . Chicken pox as a child  . Measles as a child  . Miscarriage 1981  . Screening for malignant neoplasm of the cervix 04/26/2011  . Dermatitis 04/26/2011  . Onychomycosis 04/26/2011  . Atrophic vaginitis 11/18/2012  . HTN (hypertension) 04/13/2010    Qualifier: Diagnosis of  By: Charlett Blake MD, Erline Levine      Past Surgical History  Procedure Laterality Date  . Dilation and curettage of uterus  1981    miscarriage    Family History  Problem Relation Age of Onset  . Osteoporosis Mother   . Coronary artery disease Mother   . Heart attack Mother 83  . Hypertension Mother   . Diabetes Father     type 2  . Heart disease Father   . Stroke Father   . Dementia Maternal Grandmother   . Asthma Maternal Grandfather   . Cancer Paternal Grandmother     ovarian  . Diabetes Paternal Grandfather     tyoe 2  . Hearing loss Paternal Grandfather   . Arthritis Other   . Hyperlipidemia Other   . Hypertension Other   . Other Other     Cardiovascular disorder    History   Social History  . Marital Status: Married    Spouse Name: N/A    Number of Children: N/A  . Years of Education: N/A   Occupational History  . bookkeeper    Social History Main Topics  . Smoking status: Former Smoker -- 1.00 packs/day    Types: Cigarettes    Quit  date: 10/16/2000  . Smokeless tobacco: Never Used  . Alcohol Use: No  . Drug Use: No  . Sexual Activity: Yes    Partners: Male   Other Topics Concern  . Not on file   Social History Narrative   Regular exercise- yes   Does Yoga 3-4 X week, personal trainer 1 X week, and walks regularly   Has lost 16 pounds by minimizing processed foods and meats    Current Outpatient Prescriptions on File Prior to Visit  Medication Sig Dispense Refill  . aspirin 81 MG tablet Take 81 mg by mouth daily.      Marland Kitchen b complex vitamins tablet Take 1 tablet by mouth daily.      . calcium citrate-vitamin D (CITRACAL+D) 315-200 MG-UNIT per tablet Take 1 tablet by mouth 2 (two) times daily.        Marland Kitchen conjugated estrogens (PREMARIN) vaginal cream Apply small amount PV 3 x weekly qhs  42.5 g  3  . losartan (COZAAR) 50 MG tablet Take 1 tablet (50 mg total) by mouth daily.  30 tablet  3  . multivitamin (THERAGRAN) per tablet Take 1 tablet by mouth daily.        . mupirocin nasal ointment (BACTROBAN) 2 %  Place into the nose at bedtime. Use one-half of tube in each nostril twice daily for ten (10) days. After application, press sides of nose together and gently massage.  10 g  0  . psyllium (METAMUCIL) 0.52 G capsule Take 2.6 g by mouth daily. 5 capsules      . triamcinolone ointment (KENALOG) 0.1 % Apply to hands qhs prn dermatitis and place  On damp gloves  30 g  2  . vitamin C (ASCORBIC ACID) 500 MG tablet Take 500 mg by mouth daily.       No current facility-administered medications on file prior to visit.    Allergies  Allergen Reactions  . Codeine   . Pneumococcal Vaccine Polyvalent     REACTION: rash at injection site and induration.    Review of Systems  Review of Systems  Constitutional: Negative for fever and malaise/fatigue.  HENT: Negative for congestion.   Eyes: Negative for discharge.  Respiratory: Negative for shortness of breath.   Cardiovascular: Negative for chest pain, palpitations and  leg swelling.  Gastrointestinal: Negative for nausea, abdominal pain and diarrhea.  Genitourinary: Negative for dysuria.  Musculoskeletal: Negative for falls.  Skin: Negative for rash.  Neurological: Negative for loss of consciousness and headaches.  Endo/Heme/Allergies: Negative for polydipsia.  Psychiatric/Behavioral: Negative for depression and suicidal ideas. The patient is not nervous/anxious and does not have insomnia.     Objective  BP 104/74  Pulse 64  Temp(Src) 97.7 F (36.5 C) (Oral)  Ht 5' 2.5" (1.588 m)  Wt 147 lb (66.679 kg)  BMI 26.44 kg/m2  SpO2 97%  Physical Exam  Physical Exam  Constitutional: She is oriented to person, place, and time and well-developed, well-nourished, and in no distress. No distress.  HENT:  Head: Normocephalic and atraumatic.  Eyes: Conjunctivae are normal.  Neck: Neck supple. No thyromegaly present.  Cardiovascular: Normal rate, regular rhythm and normal heart sounds.   No murmur heard. Pulmonary/Chest: Effort normal and breath sounds normal. She has no wheezes.  Abdominal: She exhibits no distension and no mass.  Musculoskeletal: She exhibits no edema.  Lymphadenopathy:    She has no cervical adenopathy.  Neurological: She is alert and oriented to person, place, and time.  Skin: Skin is warm and dry. No rash noted. She is not diaphoretic.  Psychiatric: Memory, affect and judgment normal.    Lab Results  Component Value Date   TSH 2.244 05/02/2013   Lab Results  Component Value Date   WBC 4.5 05/02/2013   HGB 14.2 05/02/2013   HCT 42.3 05/02/2013   MCV 96.8 05/02/2013   PLT 341 05/02/2013   Lab Results  Component Value Date   CREATININE 0.82 10/30/2013   BUN 16 10/30/2013   NA 138 10/30/2013   K 5.2 10/30/2013   CL 101 10/30/2013   CO2 31 10/30/2013   Lab Results  Component Value Date   ALT 13 05/02/2013   AST 17 05/02/2013   ALKPHOS 44 05/02/2013   BILITOT 0.9 05/02/2013   Lab Results  Component Value Date   CHOL 216*  05/02/2013   Lab Results  Component Value Date   HDL 63 05/02/2013   Lab Results  Component Value Date   LDLCALC 138* 05/02/2013   Lab Results  Component Value Date   TRIG 74 05/02/2013   Lab Results  Component Value Date   CHOLHDL 3.4 05/02/2013     Assessment & Plan  Screening for malignant neoplasm of the cervix Menarche at 11 Regular  and moderate flow no history of abnormal pap in past G2P1, s/p 1 miscarriage with D/C and 1 svd No history of abnormal MGM No concerns today no gyn surgeries except D/C  Pap today  HTN (hypertension) Well controlled, no changes to meds. Encouraged heart healthy diet such as the DASH diet and exercise as tolerated.   Mixed hyperlipidemia Encouraged heart healthy diet, increase exercise, avoid trans fats, consider a krill oil cap daily  UNSPECIFIED VITAMIN D DEFICIENCY wnl today  Medicare annual wellness visit, subsequent Patient denies any difficulties at home. No trouble with ADLs, depression or falls. No recent changes to vision or hearing. Is UTD with immunizations. Is UTD with screening. Discussed Advanced Directives, patient agrees to bring us copies of documents if can. Encouraged heart healthy diet, exercise as tolerated and adequate sleep. Agrees to continue with screening MGM lst one August 2014, EKG today with 1 flipped T wave, likely normal variant given patient asymptomatic, she will notify us if any concerning symptoms occur. Pap today, immunizations UTD.  Sees opthamology, Dr Sarah Soneburner at [facility] Sees dermatology, Dr Laura Lomax Sees Gastroenterology, Dr Saleby in [place], last colonoscopy 2013  Tinea pedis Following with dermatology, is being treated with Nizoral   

## 2014-05-08 LAB — VITAMIN D 25 HYDROXY (VIT D DEFICIENCY, FRACTURES): Vit D, 25-Hydroxy: 53 ng/mL (ref 30–89)

## 2014-05-08 LAB — TSH: TSH: 2.012 u[IU]/mL (ref 0.350–4.500)

## 2014-05-10 ENCOUNTER — Encounter: Payer: Self-pay | Admitting: Family Medicine

## 2014-05-10 DIAGNOSIS — B353 Tinea pedis: Secondary | ICD-10-CM

## 2014-05-10 DIAGNOSIS — Z0000 Encounter for general adult medical examination without abnormal findings: Secondary | ICD-10-CM | POA: Insufficient documentation

## 2014-05-10 HISTORY — DX: Tinea pedis: B35.3

## 2014-05-10 NOTE — Assessment & Plan Note (Signed)
wnl today 

## 2014-05-10 NOTE — Assessment & Plan Note (Signed)
Encouraged heart healthy diet, increase exercise, avoid trans fats, consider a krill oil cap daily 

## 2014-05-10 NOTE — Assessment & Plan Note (Signed)
Following with dermatology, is being treated with Nizoral

## 2014-05-10 NOTE — Assessment & Plan Note (Signed)
Well controlled, no changes to meds. Encouraged heart healthy diet such as the DASH diet and exercise as tolerated.  °

## 2014-05-10 NOTE — Assessment & Plan Note (Addendum)
Patient denies any difficulties at home. No trouble with ADLs, depression or falls. No recent changes to vision or hearing. Is UTD with immunizations. Is UTD with screening. Discussed Advanced Directives, patient agrees to bring [place] copies of documents if can. Encouraged heart healthy diet, exercise as tolerated and adequate sleep. Agrees to continue with screening MGM lst one August 2014, EKG today with 1 flipped T wave, likely normal variant given patient asymptomatic, she will notify us if any concerning symptoms occur. Pap today, immunizations UTD.  Sees opthamology, Dr Sabino Niemann at [facility] dermatology, Dr Rolm Bookbinder Sees Gastroenterology, Dr Debbrah Alar in [place], last colonoscopy 2013

## 2014-05-11 LAB — CYTOLOGY - PAP

## 2014-05-18 ENCOUNTER — Encounter: Payer: Self-pay | Admitting: Family Medicine

## 2014-06-17 LAB — HM MAMMOGRAPHY: HM Mammogram: NORMAL

## 2014-06-19 ENCOUNTER — Encounter: Payer: Self-pay | Admitting: Family Medicine

## 2014-06-26 ENCOUNTER — Encounter: Payer: Self-pay | Admitting: Family Medicine

## 2014-07-01 ENCOUNTER — Encounter: Payer: Self-pay | Admitting: Family Medicine

## 2014-07-08 ENCOUNTER — Ambulatory Visit (INDEPENDENT_AMBULATORY_CARE_PROVIDER_SITE_OTHER): Payer: Managed Care, Other (non HMO)

## 2014-07-08 DIAGNOSIS — Z23 Encounter for immunization: Secondary | ICD-10-CM

## 2014-08-12 ENCOUNTER — Encounter: Payer: Self-pay | Admitting: Physician Assistant

## 2014-08-12 ENCOUNTER — Ambulatory Visit (INDEPENDENT_AMBULATORY_CARE_PROVIDER_SITE_OTHER): Payer: Managed Care, Other (non HMO) | Admitting: Physician Assistant

## 2014-08-12 VITALS — BP 160/86 | HR 85 | Temp 98.1°F | Resp 16 | Ht 62.5 in | Wt 155.5 lb

## 2014-08-12 DIAGNOSIS — J0100 Acute maxillary sinusitis, unspecified: Secondary | ICD-10-CM

## 2014-08-12 MED ORDER — AZITHROMYCIN 250 MG PO TABS: ORAL_TABLET | ORAL | Status: DC

## 2014-08-12 NOTE — Patient Instructions (Signed)
Please take antibiotic as directed.  Increase fluid intake.  Use Saline nasal spray.  Take a daily multivitamin. Delsym for cough.  Place a humidifier in the bedroom.  Please call or return clinic if symptoms are not improving.  Sinusitis Sinusitis is redness, soreness, and swelling (inflammation) of the paranasal sinuses. Paranasal sinuses are air pockets within the bones of your face (beneath the eyes, the middle of the forehead, or above the eyes). In healthy paranasal sinuses, mucus is able to drain out, and air is able to circulate through them by way of your nose. However, when your paranasal sinuses are inflamed, mucus and air can become trapped. This can allow bacteria and other germs to grow and cause infection. Sinusitis can develop quickly and last only a short time (acute) or continue over a long period (chronic). Sinusitis that lasts for more than 12 weeks is considered chronic.  CAUSES  Causes of sinusitis include:  Allergies.  Structural abnormalities, such as displacement of the cartilage that separates your nostrils (deviated septum), which can decrease the air flow through your nose and sinuses and affect sinus drainage.  Functional abnormalities, such as when the small hairs (cilia) that line your sinuses and help remove mucus do not work properly or are not present. SYMPTOMS  Symptoms of acute and chronic sinusitis are the same. The primary symptoms are pain and pressure around the affected sinuses. Other symptoms include:  Upper toothache.  Earache.  Headache.  Bad breath.  Decreased sense of smell and taste.  A cough, which worsens when you are lying flat.  Fatigue.  Fever.  Thick drainage from your nose, which often is green and may contain pus (purulent).  Swelling and warmth over the affected sinuses. DIAGNOSIS  Your caregiver will perform a physical exam. During the exam, your caregiver may:  Look in your nose for signs of abnormal growths in your  nostrils (nasal polyps).  Tap over the affected sinus to check for signs of infection.  View the inside of your sinuses (endoscopy) with a special imaging device with a light attached (endoscope), which is inserted into your sinuses. If your caregiver suspects that you have chronic sinusitis, one or more of the following tests may be recommended:  Allergy tests.  Nasal culture A sample of mucus is taken from your nose and sent to a lab and screened for bacteria.  Nasal cytology A sample of mucus is taken from your nose and examined by your caregiver to determine if your sinusitis is related to an allergy. TREATMENT  Most cases of acute sinusitis are related to a viral infection and will resolve on their own within 10 days. Sometimes medicines are prescribed to help relieve symptoms (pain medicine, decongestants, nasal steroid sprays, or saline sprays).  However, for sinusitis related to a bacterial infection, your caregiver will prescribe antibiotic medicines. These are medicines that will help kill the bacteria causing the infection.  Rarely, sinusitis is caused by a fungal infection. In theses cases, your caregiver will prescribe antifungal medicine. For some cases of chronic sinusitis, surgery is needed. Generally, these are cases in which sinusitis recurs more than 3 times per year, despite other treatments. HOME CARE INSTRUCTIONS   Drink plenty of water. Water helps thin the mucus so your sinuses can drain more easily.  Use a humidifier.  Inhale steam 3 to 4 times a day (for example, sit in the bathroom with the shower running).  Apply a warm, moist washcloth to your face 3 to 4   times a day, or as directed by your caregiver.  Use saline nasal sprays to help moisten and clean your sinuses.  Take over-the-counter or prescription medicines for pain, discomfort, or fever only as directed by your caregiver. SEEK IMMEDIATE MEDICAL CARE IF:  You have increasing pain or severe  headaches.  You have nausea, vomiting, or drowsiness.  You have swelling around your face.  You have vision problems.  You have a stiff neck.  You have difficulty breathing. MAKE SURE YOU:   Understand these instructions.  Will watch your condition.  Will get help right away if you are not doing well or get worse. Document Released: 10/02/2005 Document Revised: 12/25/2011 Document Reviewed: 10/17/2011 ExitCare Patient Information 2014 [employer].   

## 2014-08-12 NOTE — Progress Notes (Signed)
Pre visit review using our clinic review tool, if applicable. No additional management support is needed unless otherwise documented below in the visit note/SLS  

## 2014-08-12 NOTE — Progress Notes (Signed)
Patient presents to clinic today c/o ST, sinus pressure, sinus pain L>R, L ear pain and non-productive cough.  Denies fever, chills, tooth pain, recent travel or sick contact.  Has been taking Sudafed despite history of hypertension.  Past Medical History  Diagnosis Date  . Osteopenia 10/17/1999  . Chicken pox as a child  . Measles as a child  . Miscarriage 1981  . Screening for malignant neoplasm of the cervix 04/26/2011  . Dermatitis 04/26/2011  . Onychomycosis 04/26/2011  . Atrophic vaginitis 11/18/2012  . HTN (hypertension) 04/13/2010    Qualifier: Diagnosis of  By: Charlett Blake MD, Erline Levine    . Tinea pedis 05/10/2014    Current Outpatient Prescriptions on File Prior to Visit  Medication Sig Dispense Refill  . aspirin 81 MG tablet Take 81 mg by mouth daily.      Marland Kitchen augmented betamethasone dipropionate (DIPROLENE-AF) 0.05 % ointment Apply topically 2 (two) times daily.      Marland Kitchen b complex vitamins tablet Take 1 tablet by mouth daily.      . calcium citrate-vitamin D (CITRACAL+D) 315-200 MG-UNIT per tablet Take 1 tablet by mouth 2 (two) times daily.        Marland Kitchen conjugated estrogens (PREMARIN) vaginal cream Apply small amount PV 3 x weekly qhs  42.5 g  3  . ketoconazole (NIZORAL) 2 % cream Apply 1 application topically daily.      Marland Kitchen losartan (COZAAR) 50 MG tablet Take 1 tablet (50 mg total) by mouth daily.  30 tablet  3  . multivitamin (THERAGRAN) per tablet Take 1 tablet by mouth daily.        . mupirocin nasal ointment (BACTROBAN) 2 % Place into the nose at bedtime. Use one-half of tube in each nostril twice daily for ten (10) days. After application, press sides of nose together and gently massage.  10 g  0  . Omega-3 Fatty Acids-Vitamin E (COROMEGA PO) Take by mouth.      . psyllium (METAMUCIL) 0.52 G capsule Take 2.6 g by mouth daily. 5 capsules      . triamcinolone ointment (KENALOG) 0.1 % Apply to hands qhs prn dermatitis and place  On damp gloves  30 g  2  . vitamin C (ASCORBIC ACID) 500 MG  tablet Take 500 mg by mouth daily.       No current facility-administered medications on file prior to visit.    Allergies  Allergen Reactions  . Codeine   . Pneumococcal Vaccine Polyvalent     REACTION: rash at injection site and induration.    Family History  Problem Relation Age of Onset  . Osteoporosis Mother   . Coronary artery disease Mother   . Heart attack Mother 87  . Hypertension Mother   . Diabetes Father     type 2  . Heart disease Father   . Stroke Father   . Dementia Maternal Grandmother   . Asthma Maternal Grandfather   . Cancer Paternal Grandmother     ovarian  . Diabetes Paternal Grandfather     tyoe 2  . Hearing loss Paternal Grandfather   . Arthritis Other   . Hyperlipidemia Other   . Hypertension Other   . Other Other     Cardiovascular disorder    History   Social History  . Marital Status: Married    Spouse Name: N/A    Number of Children: N/A  . Years of Education: N/A   Occupational History  . bookkeeper  Social History Main Topics  . Smoking status: Former Smoker -- 1.00 packs/day    Types: Cigarettes    Quit date: 10/16/2000  . Smokeless tobacco: Never Used  . Alcohol Use: No  . Drug Use: No  . Sexual Activity: Yes    Partners: Male   Other Topics Concern  . 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  All other ROS are negative.  BP 160/86  Pulse 85  Temp(Src) 98.1 F (36.7 C) (Oral)  Resp 16  Ht 5' 2.5" (1.588 m)  Wt 155 lb 8 oz (70.534 kg)  BMI 27.97 kg/m2  SpO2 99%  Physical Exam  Vitals reviewed. Constitutional: She is oriented to person, place, and time and well-developed, well-nourished, and in no distress.  HENT:  Head: Normocephalic and atraumatic.  Right Ear: Tympanic membrane, external ear and ear canal normal.  Left Ear:  Tympanic membrane, external ear and ear canal normal.  Nose: Right sinus exhibits no maxillary sinus tenderness and no frontal sinus tenderness. Left sinus exhibits maxillary sinus tenderness. Left sinus exhibits no frontal sinus tenderness.  Mouth/Throat: Uvula is midline, oropharynx is clear and moist and mucous membranes are normal. No oropharyngeal exudate.  Eyes: Conjunctivae are normal. Pupils are equal, round, and reactive to light.  Neck: Neck supple. No thyromegaly present.  Cardiovascular: Normal rate, regular rhythm, normal heart sounds and intact distal pulses.   Pulmonary/Chest: Effort normal and breath sounds normal. No respiratory distress. She has no wheezes. She has no rales. She exhibits no tenderness.  Lymphadenopathy:    She has no cervical adenopathy.  Neurological: She is alert and oriented to person, place, and time.  Skin: Skin is warm and dry. No rash noted.  Psychiatric: Affect normal.    Recent Results (from the past 2160 hour(s))  [facility] MAMMOGRAPHY     Status: None   Collection Time    06/17/14 12:00 AM      Result Value Ref Range   [facility] Mammogram normal- [facility]      Assessment/Plan: Acute maxillary sinusitis Rx Azithromycin.  Increase fluids.  Rest.  Saline nasal spray.  Probiotic.  Mucinex as directed.  Humidifier in bedroom. Delsym for cough.  Avoid Sudafed giving history of HTN.  Call or return to clinic if symptoms are not improving.

## 2014-08-12 NOTE — Assessment & Plan Note (Signed)
Rx Azithromycin.  Increase fluids.  Rest.  Saline nasal spray.  Probiotic.  Mucinex as directed.  Humidifier in bedroom. Delsym for cough.  Avoid Sudafed giving history of HTN.  Call or return to clinic if symptoms are not improving.

## 2014-10-13 ENCOUNTER — Ambulatory Visit (INDEPENDENT_AMBULATORY_CARE_PROVIDER_SITE_OTHER): Payer: Managed Care, Other (non HMO) | Admitting: Nurse Practitioner

## 2014-10-13 ENCOUNTER — Encounter: Payer: Self-pay | Admitting: Nurse Practitioner

## 2014-10-13 VITALS — BP 154/89 | HR 62 | Temp 97.6°F | Ht 62.5 in | Wt 157.0 lb

## 2014-10-13 DIAGNOSIS — M79605 Pain in left leg: Secondary | ICD-10-CM

## 2014-10-13 NOTE — Progress Notes (Signed)
Pre visit review using our clinic review tool, if applicable. No additional management support is needed unless otherwise documented below in the visit note. 

## 2014-10-13 NOTE — Patient Instructions (Signed)
Please get ultra sound of leg.  My office will call with results.  Apply heat to leg for 10 minutes 3 times daily. Take tylenol if needed for pain.

## 2014-10-13 NOTE — Progress Notes (Signed)
   Subjective:    Patient ID: Mandy [name], female    DOB: 1946-04-27, 68 y.o.   MRN: [id number]  Leg Pain  The incident occurred 5 to 7 days ago (6d). The incident occurred at home. The injury mechanism was a direct blow (not sure what hit her leg-dog, grandchild-says house was chaotic. remembers leg hurting Wednesday night when went to bed.). The pain is present in the left leg (antreior shinn). The quality of the pain is described as aching. The pain is mild. The pain has been intermittent since onset. Pertinent negatives include no inability to bear weight, loss of sensation, numbness or tingling. She reports no foreign bodies present. The symptoms are aggravated by weight bearing and movement. She has tried nothing for the symptoms.      Review of Systems  Respiratory: Negative for cough, chest tightness and shortness of breath.   Cardiovascular: Negative for chest pain and palpitations.  Musculoskeletal:       L leg feels tight, bruised, uncomforatble, nodule  Neurological: Negative for tingling, weakness and numbness.       Objective:   Physical Exam  Constitutional: She is oriented to person, place, and time. She appears well-developed and well-nourished. No distress.  HENT:  Head: Normocephalic and atraumatic.  Eyes: Conjunctivae are normal. Right eye exhibits no discharge. Left eye exhibits no discharge.  Cardiovascular: Normal rate, regular rhythm and normal heart sounds.   No murmur heard. Pulmonary/Chest: Effort normal and breath sounds normal. No respiratory distress. She has no wheezes. She has no rales.  Musculoskeletal: She exhibits edema and tenderness.       Legs: Nodule is siz of large grape  Neurological: She is alert and oriented to person, place, and time.  Skin: Skin is warm and dry.  Psychiatric: She has a normal mood and affect. Her behavior is normal. Thought content normal.  Vitals reviewed.         Assessment & Plan:  1. Left leg pain Cannot rule  out DVT secondary to injury DD: soft tissue, superficial swelling - US Venous Img Lower Unilateral Left; Future Heat three times daily Tylenol for pain PRN F/u PRN [place] results

## 2014-10-14 ENCOUNTER — Telehealth: Payer: Self-pay | Admitting: Nurse Practitioner

## 2014-10-14 ENCOUNTER — Ambulatory Visit
Admission: RE | Admit: 2014-10-14 | Discharge: 2014-10-14 | Disposition: A | Discharge status: Home / Self Care | Payer: Medicare HMO | Source: Ambulatory Visit | Attending: Nurse Practitioner | Admitting: Nurse Practitioner

## 2014-10-14 DIAGNOSIS — M79605 Pain in left leg: Secondary | ICD-10-CM

## 2014-10-14 NOTE — Telephone Encounter (Signed)
pls call pt: Advise No blood clot in l leg, only soft tissue injury.  She can continue with instructions given in office.

## 2014-10-14 NOTE — Telephone Encounter (Signed)
LMOVM for pt to return call 

## 2014-10-15 NOTE — Telephone Encounter (Signed)
Spoke with pt, advised ultrasound results. Pt understood.

## 2014-11-05 ENCOUNTER — Other Ambulatory Visit: Payer: Self-pay | Admitting: *Deleted

## 2014-11-05 DIAGNOSIS — I10 Essential (primary) hypertension: Secondary | ICD-10-CM

## 2014-11-05 MED ORDER — LOSARTAN POTASSIUM 50 MG PO TABS: 50.0000 mg | ORAL_TABLET | Freq: Every day | ORAL | Status: DC

## 2014-11-05 NOTE — Telephone Encounter (Signed)
Rx sent to the pharmacy by e-script.//AB/CMA 

## 2014-11-09 ENCOUNTER — Other Ambulatory Visit: Payer: Self-pay | Admitting: Family Medicine

## 2014-11-10 ENCOUNTER — Telehealth: Payer: Self-pay | Admitting: *Deleted

## 2014-11-10 MED ORDER — MUPIROCIN CALCIUM 2 % NA OINT: TOPICAL_OINTMENT | NASAL | Status: DC

## 2014-11-10 NOTE — Telephone Encounter (Signed)
OK to refill Mupirocin ointment but it comes in a big tube now that is cheaper. Apply to nares via qtip qhs x 10 days, disp #1 tube

## 2014-11-10 NOTE — Telephone Encounter (Signed)
Rx sent to the pharmacy by e-script.//AB/CMA 

## 2014-11-10 NOTE — Telephone Encounter (Signed)
Rx was filled.//AB/CMA

## 2014-11-10 NOTE — Telephone Encounter (Signed)
Requesting Mupirocin 2% ointment-Place into the nose at bedtime.  Use one-half of tube in each nostril twice daily for ten days.  After application press sides of nose together and gently massage. Last refill:07-01-12 Last OV:05-07-14  Please advise.//AB/CMA

## 2015-01-23 ENCOUNTER — Other Ambulatory Visit: Payer: Self-pay | Admitting: Family Medicine

## 2015-03-24 ENCOUNTER — Other Ambulatory Visit: Payer: Self-pay | Admitting: Family Medicine

## 2015-04-12 ENCOUNTER — Other Ambulatory Visit: Payer: Self-pay

## 2015-04-20 ENCOUNTER — Other Ambulatory Visit: Payer: Self-pay | Admitting: Family Medicine

## 2015-05-12 ENCOUNTER — Telehealth: Payer: Self-pay | Admitting: *Deleted

## 2015-05-12 NOTE — Telephone Encounter (Signed)
Unable to reach patient at time of Pre-Visit Call.  Left message for patient to return call when available.    

## 2015-05-13 ENCOUNTER — Ambulatory Visit (INDEPENDENT_AMBULATORY_CARE_PROVIDER_SITE_OTHER): Payer: Medicare HMO | Admitting: Family Medicine

## 2015-05-13 ENCOUNTER — Encounter: Payer: Self-pay | Admitting: Family Medicine

## 2015-05-13 VITALS — BP 130/90 | HR 70 | Temp 97.9°F | Ht 62.0 in | Wt 156.1 lb

## 2015-05-13 DIAGNOSIS — M899 Disorder of bone, unspecified: Secondary | ICD-10-CM

## 2015-05-13 DIAGNOSIS — Z0000 Encounter for general adult medical examination without abnormal findings: Secondary | ICD-10-CM

## 2015-05-13 DIAGNOSIS — E559 Vitamin D deficiency, unspecified: Secondary | ICD-10-CM

## 2015-05-13 DIAGNOSIS — E782 Mixed hyperlipidemia: Secondary | ICD-10-CM

## 2015-05-13 DIAGNOSIS — Z124 Encounter for screening for malignant neoplasm of cervix: Secondary | ICD-10-CM

## 2015-05-13 DIAGNOSIS — I10 Essential (primary) hypertension: Secondary | ICD-10-CM | POA: Diagnosis not present

## 2015-05-13 DIAGNOSIS — M949 Disorder of cartilage, unspecified: Secondary | ICD-10-CM

## 2015-05-13 LAB — COMPREHENSIVE METABOLIC PANEL
ALT: 12 U/L (ref 0–35)
AST: 19 U/L (ref 0–37)
Albumin: 4 g/dL (ref 3.5–5.2)
Alkaline Phosphatase: 43 U/L (ref 39–117)
BILIRUBIN TOTAL: 0.7 mg/dL (ref 0.2–1.2)
BUN: 17 mg/dL (ref 6–23)
CALCIUM: 9.3 mg/dL (ref 8.4–10.5)
CHLORIDE: 100 meq/L (ref 96–112)
CO2: 33 mEq/L — ABNORMAL HIGH (ref 19–32)
Creatinine, Ser: 0.86 mg/dL (ref 0.40–1.20)
GFR: 69.26 mL/min (ref >60.00)
Glucose, Bld: 84 mg/dL (ref 70–99)
Potassium: 4.1 mEq/L (ref 3.5–5.1)
SODIUM: 136 meq/L (ref 135–145)
TOTAL PROTEIN: 6.6 g/dL (ref 6.0–8.3)

## 2015-05-13 LAB — CBC
HEMATOCRIT: 42 % (ref 36.0–46.0)
HEMOGLOBIN: 14 g/dL (ref 12.0–15.0)
MCHC: 33.4 g/dL (ref 30.0–36.0)
MCV: 97.1 fl (ref 78.0–100.0)
Platelets: 343 10*3/uL (ref 150.0–400.0)
RBC: 4.32 Mil/uL (ref 3.87–5.11)
RDW: 13.6 % (ref 11.5–15.5)
WBC: 5.4 10*3/uL (ref 4.0–10.5)

## 2015-05-13 LAB — LIPID PANEL
Cholesterol: 233 mg/dL — ABNORMAL HIGH (ref 0–200)
HDL: 68.1 mg/dL (ref >39.00)
LDL CALC: 152 mg/dL — AB (ref 0–99)
NONHDL: 164.59
Total CHOL/HDL Ratio: 3
Triglycerides: 63 mg/dL (ref 0.0–149.0)
VLDL: 12.6 mg/dL (ref 0.0–40.0)

## 2015-05-13 LAB — TSH: TSH: 2.29 u[IU]/mL (ref 0.35–4.50)

## 2015-05-13 LAB — VITAMIN D 25 HYDROXY (VIT D DEFICIENCY, FRACTURES): VITD: 33.39 ng/mL (ref 30.00–100.00)

## 2015-05-13 NOTE — Assessment & Plan Note (Addendum)
Vitamin D checked today, wnl, continue supplements

## 2015-05-13 NOTE — Assessment & Plan Note (Addendum)
Sees opthamology, Dr Juanito Doom at [facility] dermatology, Dr Rolm Bookbinder Sees Gastroenterology, Dr Debbrah Alar in [place], last colonoscopy 2013 [facility] on 06/23/2014 Pap on 05/07/2014 Colonoscopy  07/11/2012 Patient denies any difficulties at home. No trouble with ADLs, depression or falls. No recent changes to vision or hearing. Is UTD with immunizations. Is UTD with screening. Discussed Advanced Directives, patient agrees to bring [place] copies of documents if can. Encouraged heart healthy diet, exercise as tolerated and adequate sleep Given and reviewed copy of ACP documents from [employer] and encouraged to complete and return

## 2015-05-13 NOTE — Assessment & Plan Note (Signed)
Pap last year normal

## 2015-05-13 NOTE — Patient Instructions (Signed)
Calcium 3 x daily Vitamin D 2000 IU daily Probiotic daily, digestive advantage [facility] colon health, online [employer] at [email or web address] Curcumen/Turmeric caps daily helps inflammation Krill oil caps daily   Preventive Care for Adults A healthy lifestyle and preventive care can promote health and wellness. Preventive health guidelines for women include the following key practices.  A routine yearly physical is a good way to check with your health care provider about your health and preventive screening. It is a chance to share any concerns and updates on your health and to receive a thorough exam.  Visit your dentist for a routine exam and preventive care every 6 months. Brush your teeth twice a day and floss once a day. Good oral hygiene prevents tooth decay and gum disease.  The frequency of eye exams is based on your age, health, family medical history, use of contact lenses, and other factors. Follow your health care provider's recommendations for frequency of eye exams.  Eat a healthy diet. Foods like vegetables, fruits, whole grains, low-fat dairy products, and lean protein foods contain the nutrients you need without too many calories. Decrease your intake of foods high in solid fats, added sugars, and salt. Eat the right amount of calories for you.Get information about a proper diet from your health care provider, if necessary.  Regular physical exercise is one of the most important things you can do for your health. Most adults should get at least 150 minutes of moderate-intensity exercise (any activity that increases your heart rate and causes you to sweat) each week. In addition, most adults need muscle-strengthening exercises on 2 or more days a week.  Maintain a healthy weight. The body mass index (BMI) is a screening tool to identify possible weight problems. It provides an estimate of body fat based on height and weight. Your health care provider can find your BMI and can  help you achieve or maintain a healthy weight.For adults 20 years and older:  A BMI below 18.5 is considered underweight.  A BMI of 18.5 to 24.9 is normal.  A BMI of 25 to 29.9 is considered overweight.  A BMI of 30 and above is considered obese.  Maintain normal blood lipids and cholesterol levels by exercising and minimizing your intake of saturated fat. Eat a balanced diet with plenty of fruit and vegetables. Blood tests for lipids and cholesterol should begin at age 77 and be repeated every 5 years. If your lipid or cholesterol levels are high, you are over 50, or you are at high risk for heart disease, you may need your cholesterol levels checked more frequently.Ongoing high lipid and cholesterol levels should be treated with medicines if diet and exercise are not working.  If you smoke, find out from your health care provider how to quit. If you do not use tobacco, do not start.  Lung cancer screening is recommended for adults aged 73-80 years who are at high risk for developing lung cancer because of a history of smoking. A yearly low-dose CT scan of the lungs is recommended for people who have at least a 30-pack-year history of smoking and are a current smoker or have quit within the past 15 years. A pack year of smoking is smoking an average of 1 pack of cigarettes a day for 1 year (for example: 1 pack a day for 30 years or 2 packs a day for 15 years). Yearly screening should continue until the smoker has stopped smoking for at least 15 years. Yearly screening  should be stopped for people who develop a health problem that would prevent them from having lung cancer treatment.  If you are pregnant, do not drink alcohol. If you are breastfeeding, be very cautious about drinking alcohol. If you are not pregnant and choose to drink alcohol, do not have more than 1 drink per day. One drink is considered to be 12 ounces (355 mL) of beer, 5 ounces (148 mL) of wine, or 1.5 ounces (44 mL) of  liquor.  Avoid use of street drugs. Do not share needles with anyone. Ask for help if you need support or instructions about stopping the use of drugs.  High blood pressure causes heart disease and increases the risk of stroke. Your blood pressure should be checked at least every 1 to 2 years. Ongoing high blood pressure should be treated with medicines if weight loss and exercise do not work.  If you are 60-69 years old, ask your health care provider if you should take aspirin to prevent strokes.  Diabetes screening involves taking a blood sample to check your fasting blood sugar level. This should be done once every 3 years, after age 11, if you are within normal weight and without risk factors for diabetes. Testing should be considered at a younger age or be carried out more frequently if you are overweight and have at least 1 risk factor for diabetes.  Breast cancer screening is essential preventive care for women. You should practice "breast self-awareness." This means understanding the normal appearance and feel of your breasts and may include breast self-examination. Any changes detected, no matter how small, should be reported to a health care provider. Women in their 108s and 30s should have a clinical breast exam (CBE) by a health care provider as part of a regular health exam every 1 to 3 years. After age 23, women should have a CBE every year. Starting at age 34, women should consider having a mammogram (breast X-ray test) every year. Women who have a family history of breast cancer should talk to their health care provider about genetic screening. Women at a high risk of breast cancer should talk to their health care providers about having an MRI and a mammogram every year.  Breast cancer gene (BRCA)-related cancer risk assessment is recommended for women who have family members with BRCA-related cancers. BRCA-related cancers include breast, ovarian, tubal, and peritoneal cancers. Having  family members with these cancers may be associated with an increased risk for harmful changes (mutations) in the breast cancer genes BRCA1 and BRCA2. Results of the assessment will determine the need for genetic counseling and BRCA1 and BRCA2 testing.  Routine pelvic exams to screen for cancer are no longer recommended for nonpregnant women who are considered low risk for cancer of the pelvic organs (ovaries, uterus, and vagina) and who do not have symptoms. Ask your health care provider if a screening pelvic exam is right for you.  If you have had past treatment for cervical cancer or a condition that could lead to cancer, you need Pap tests and screening for cancer for at least 20 years after your treatment. If Pap tests have been discontinued, your risk factors (such as having a new sexual partner) need to be reassessed to determine if screening should be resumed. Some women have medical problems that increase the chance of getting cervical cancer. In these cases, your health care provider may recommend more frequent screening and Pap tests.  The HPV test is an additional test that  may be used for cervical cancer screening. The HPV test looks for the virus that can cause the cell changes on the cervix. The cells collected during the Pap test can be tested for HPV. The HPV test could be used to screen women aged 60 years and older, and should be used in women of any age who have unclear Pap test results. After the age of 28, women should have HPV testing at the same frequency as a Pap test.  Colorectal cancer can be detected and often prevented. Most routine colorectal cancer screening begins at the age of 34 years and continues through age 43 years. However, your health care provider may recommend screening at an earlier age if you have risk factors for colon cancer. On a yearly basis, your health care provider may provide home test kits to check for hidden blood in the stool. Use of a small camera at  the end of a tube, to directly examine the colon (sigmoidoscopy or colonoscopy), can detect the earliest forms of colorectal cancer. Talk to your health care provider about this at age 61, when routine screening begins. Direct exam of the colon should be repeated every 5-10 years through age 72 years, unless early forms of pre-cancerous polyps or small growths are found.  People who are at an increased risk for hepatitis B should be screened for this virus. You are considered at high risk for hepatitis B if:  You were born in a country where hepatitis B occurs often. Talk with your health care provider about which countries are considered high risk.  Your parents were born in a high-risk country and you have not received a shot to protect against hepatitis B (hepatitis B vaccine).  You have HIV or AIDS.  You use needles to inject street drugs.  You live with, or have sex with, someone who has hepatitis B.  You get hemodialysis treatment.  You take certain medicines for conditions like cancer, organ transplantation, and autoimmune conditions.  Hepatitis C blood testing is recommended for all people born from [birth year] through 1965 and any individual with known risks for hepatitis C.  Practice safe sex. Use condoms and avoid high-risk sexual practices to reduce the spread of sexually transmitted infections (STIs). STIs include gonorrhea, chlamydia, syphilis, trichomonas, herpes, HPV, and human immunodeficiency virus (HIV). Herpes, HIV, and HPV are viral illnesses that have no cure. They can result in disability, cancer, and death.  You should be screened for sexually transmitted illnesses (STIs) including gonorrhea and chlamydia if:  You are sexually active and are younger than 24 years.  You are older than 24 years and your health care provider tells you that you are at risk for this type of infection.  Your sexual activity has changed since you were last screened and you are at an increased  risk for chlamydia or gonorrhea. Ask your health care provider if you are at risk.  If you are at risk of being infected with HIV, it is recommended that you take a prescription medicine daily to prevent HIV infection. This is called preexposure prophylaxis (PrEP). You are considered at risk if:  You are a heterosexual woman, are sexually active, and are at increased risk for HIV infection.  You take drugs by injection.  You are sexually active with a partner who has HIV.  Talk with your health care provider about whether you are at high risk of being infected with HIV. If you choose to begin PrEP, you should first be  tested for HIV. You should then be tested every 3 months for as long as you are taking PrEP.  Osteoporosis is a disease in which the bones lose minerals and strength with aging. This can result in serious bone fractures or breaks. The risk of osteoporosis can be identified using a bone density scan. Women ages 24 years and over and women at risk for fractures or osteoporosis should discuss screening with their health care providers. Ask your health care provider whether you should take a calcium supplement or vitamin D to reduce the rate of osteoporosis.  Menopause can be associated with physical symptoms and risks. Hormone replacement therapy is available to decrease symptoms and risks. You should talk to your health care provider about whether hormone replacement therapy is right for you.  Use sunscreen. Apply sunscreen liberally and repeatedly throughout the day. You should seek shade when your shadow is shorter than you. Protect yourself by wearing long sleeves, pants, a wide-brimmed hat, and sunglasses year round, whenever you are outdoors.  Once a month, do a whole body skin exam, using a mirror to look at the skin on your back. Tell your health care provider of new moles, moles that have irregular borders, moles that are larger than a pencil eraser, or moles that have changed  in shape or color.  Stay current with required vaccines (immunizations).  Influenza vaccine. All adults should be immunized every year.  Tetanus, diphtheria, and acellular pertussis (Td, Tdap) vaccine. Pregnant women should receive 1 dose of Tdap vaccine during each pregnancy. The dose should be obtained regardless of the length of time since the last dose. Immunization is preferred during the 27th-36th week of gestation. An adult who has not previously received Tdap or who does not know her vaccine status should receive 1 dose of Tdap. This initial dose should be followed by tetanus and diphtheria toxoids (Td) booster doses every 10 years. Adults with an unknown or incomplete history of completing a 3-dose immunization series with Td-containing vaccines should begin or complete a primary immunization series including a Tdap dose. Adults should receive a Td booster every 10 years.  Varicella vaccine. An adult without evidence of immunity to varicella should receive 2 doses or a second dose if she has previously received 1 dose. Pregnant females who do not have evidence of immunity should receive the first dose after pregnancy. This first dose should be obtained before leaving the health care facility. The second dose should be obtained 4-8 weeks after the first dose.  Human papillomavirus (HPV) vaccine. Females aged 13-26 years who have not received the vaccine previously should obtain the 3-dose series. The vaccine is not recommended for use in pregnant females. However, pregnancy testing is not needed before receiving a dose. If a female is found to be pregnant after receiving a dose, no treatment is needed. In that case, the remaining doses should be delayed until after the pregnancy. Immunization is recommended for any person with an immunocompromised condition through the age of 53 years if she did not get any or all doses earlier. During the 3-dose series, the second dose should be obtained 4-8 weeks  after the first dose. The third dose should be obtained 24 weeks after the first dose and 16 weeks after the second dose.  Zoster vaccine. One dose is recommended for adults aged 34 years or older unless certain conditions are present.  Measles, mumps, and rubella (MMR) vaccine. Adults born before [birth year] generally are considered immune to measles and  mumps. Adults born in [birth year] or later should have 1 or more doses of MMR vaccine unless there is a contraindication to the vaccine or there is laboratory evidence of immunity to each of the three diseases. A routine second dose of MMR vaccine should be obtained at least 28 days after the first dose for students attending postsecondary schools, health care workers, or international travelers. People who received inactivated measles vaccine or an unknown type of measles vaccine during 1963-1967 should receive 2 doses of MMR vaccine. People who received inactivated mumps vaccine or an unknown type of mumps vaccine before 1979 and are at high risk for mumps infection should consider immunization with 2 doses of MMR vaccine. For females of childbearing age, rubella immunity should be determined. If there is no evidence of immunity, females who are not pregnant should be vaccinated. If there is no evidence of immunity, females who are pregnant should delay immunization until after pregnancy. Unvaccinated health care workers born before [birth year] who lack laboratory evidence of measles, mumps, or rubella immunity or laboratory confirmation of disease should consider measles and mumps immunization with 2 doses of MMR vaccine or rubella immunization with 1 dose of MMR vaccine.  Pneumococcal 13-valent conjugate (PCV13) vaccine. When indicated, a person who is uncertain of her immunization history and has no record of immunization should receive the PCV13 vaccine. An adult aged 83 years or older who has certain medical conditions and has not been previously immunized should receive 1  dose of PCV13 vaccine. This PCV13 should be followed with a dose of pneumococcal polysaccharide (PPSV23) vaccine. The PPSV23 vaccine dose should be obtained at least 8 weeks after the dose of PCV13 vaccine. An adult aged 21 years or older who has certain medical conditions and previously received 1 or more doses of PPSV23 vaccine should receive 1 dose of PCV13. The PCV13 vaccine dose should be obtained 1 or more years after the last PPSV23 vaccine dose.  Pneumococcal polysaccharide (PPSV23) vaccine. When PCV13 is also indicated, PCV13 should be obtained first. All adults aged 53 years and older should be immunized. An adult younger than age 32 years who has certain medical conditions should be immunized. Any person who resides in a nursing home or long-term care facility should be immunized. An adult smoker should be immunized. People with an immunocompromised condition and certain other conditions should receive both PCV13 and PPSV23 vaccines. People with human immunodeficiency virus (HIV) infection should be immunized as soon as possible after diagnosis. Immunization during chemotherapy or radiation therapy should be avoided. Routine use of PPSV23 vaccine is not recommended for American Indians, [place]atives, or people younger than 65 years unless there are medical conditions that require PPSV23 vaccine. When indicated, people who have unknown immunization and have no record of immunization should receive PPSV23 vaccine. One-time revaccination 5 years after the first dose of PPSV23 is recommended for people aged 19-64 years who have chronic kidney failure, nephrotic syndrome, asplenia, or immunocompromised conditions. People who received 1-2 doses of PPSV23 before age 32 years should receive another dose of PPSV23 vaccine at age 69 years or later if at least 5 years have passed since the previous dose. Doses of PPSV23 are not needed for people immunized with PPSV23 at or after age 80 years.  Meningococcal  vaccine. Adults with asplenia or persistent complement component deficiencies should receive 2 doses of quadrivalent meningococcal conjugate (MenACWY-D) vaccine. The doses should be obtained at least 2 months apart. Microbiologists working with certain meningococcal bacteria, [employer]  recruits, people at risk during an outbreak, and people who travel to or live in countries with a high rate of meningitis should be immunized. A first-year college student up through age 48 years who is living in a residence hall should receive a dose if she did not receive a dose on or after her 16th birthday. Adults who have certain high-risk conditions should receive one or more doses of vaccine.  Hepatitis A vaccine. Adults who wish to be protected from this disease, have certain high-risk conditions, work with hepatitis A-infected animals, work in hepatitis A research labs, or travel to or work in countries with a high rate of hepatitis A should be immunized. Adults who were previously unvaccinated and who anticipate close contact with an international adoptee during the first 60 days after arrival in the [place] from a country with a high rate of hepatitis A should be immunized.  Hepatitis B vaccine. Adults who wish to be protected from this disease, have certain high-risk conditions, may be exposed to blood or other infectious body fluids, are household contacts or sex partners of hepatitis B positive people, are clients or workers in certain care facilities, or travel to or work in countries with a high rate of hepatitis B should be immunized.  Haemophilus influenzae type b (Hib) vaccine. A previously unvaccinated person with asplenia or sickle cell disease or having a scheduled splenectomy should receive 1 dose of Hib vaccine. Regardless of previous immunization, a recipient of a hematopoietic stem cell transplant should receive a 3-dose series 6-12 months after her successful transplant. Hib vaccine is not  recommended for adults with HIV infection. Preventive Services / Frequency Ages 58 to 44 years  Blood pressure check.** / Every 1 to 2 years.  Lipid and cholesterol check.** / Every 5 years beginning at age 26.  Clinical breast exam.** / Every 3 years for women in their 8s and 85s.  BRCA-related cancer risk assessment.** / For women who have family members with a BRCA-related cancer (breast, ovarian, tubal, or peritoneal cancers).  Pap test.** / Every 2 years from ages 72 through 43. Every 3 years starting at age 81 through age 36 or 12 with a history of 3 consecutive normal Pap tests.  HPV screening.** / Every 3 years from ages 22 through ages 18 to 58 with a history of 3 consecutive normal Pap tests.  Hepatitis C blood test.** / For any individual with known risks for hepatitis C.  Skin self-exam. / Monthly.  Influenza vaccine. / Every year.  Tetanus, diphtheria, and acellular pertussis (Tdap, Td) vaccine.** / Consult your health care provider. Pregnant women should receive 1 dose of Tdap vaccine during each pregnancy. 1 dose of Td every 10 years.  Varicella vaccine.** / Consult your health care provider. Pregnant females who do not have evidence of immunity should receive the first dose after pregnancy.  HPV vaccine. / 3 doses over 6 months, if 18 and younger. The vaccine is not recommended for use in pregnant females. However, pregnancy testing is not needed before receiving a dose.  Measles, mumps, rubella (MMR) vaccine.** / You need at least 1 dose of MMR if you were born in 1957 or later. You may also need a 2nd dose. For females of childbearing age, rubella immunity should be determined. If there is no evidence of immunity, females who are not pregnant should be vaccinated. If there is no evidence of immunity, females who are pregnant should delay immunization until after pregnancy.  Pneumococcal 13-valent  conjugate (PCV13) vaccine.** / Consult your health care  provider.  Pneumococcal polysaccharide (PPSV23) vaccine.** / 1 to 2 doses if you smoke cigarettes or if you have certain conditions.  Meningococcal vaccine.** / 1 dose if you are age 54 to 34 years and a Market researcher living in a residence hall, or have one of several medical conditions, you need to get vaccinated against meningococcal disease. You may also need additional booster doses.  Hepatitis A vaccine.** / Consult your health care provider.  Hepatitis B vaccine.** / Consult your health care provider.  Haemophilus influenzae type b (Hib) vaccine.** / Consult your health care provider. Ages 58 to 63 years  Blood pressure check.** / Every 1 to 2 years.  Lipid and cholesterol check.** / Every 5 years beginning at age 50 years.  Lung cancer screening. / Every year if you are aged 85-80 years and have a 30-pack-year history of smoking and currently smoke or have quit within the past 15 years. Yearly screening is stopped once you have quit smoking for at least 15 years or develop a health problem that would prevent you from having lung cancer treatment.  Clinical breast exam.** / Every year after age 45 years.  BRCA-related cancer risk assessment.** / For women who have family members with a BRCA-related cancer (breast, ovarian, tubal, or peritoneal cancers).  Mammogram.** / Every year beginning at age 32 years and continuing for as long as you are in good health. Consult with your health care provider.  Pap test.** / Every 3 years starting at age 71 years through age 64 or 5 years with a history of 3 consecutive normal Pap tests.  HPV screening.** / Every 3 years from ages 96 years through ages 13 to 73 years with a history of 3 consecutive normal Pap tests.  Fecal occult blood test (FOBT) of stool. / Every year beginning at age 13 years and continuing until age 19 years. You may not need to do this test if you get a colonoscopy every 10 years.  Flexible sigmoidoscopy  or colonoscopy.** / Every 5 years for a flexible sigmoidoscopy or every 10 years for a colonoscopy beginning at age 32 years and continuing until age 39 years.  Hepatitis C blood test.** / For all people born from [birth year] through 1966 and any individual with known risks for hepatitis C.  Skin self-exam. / Monthly.  Influenza vaccine. / Every year.  Tetanus, diphtheria, and acellular pertussis (Tdap/Td) vaccine.** / Consult your health care provider. Pregnant women should receive 1 dose of Tdap vaccine during each pregnancy. 1 dose of Td every 10 years.  Varicella vaccine.** / Consult your health care provider. Pregnant females who do not have evidence of immunity should receive the first dose after pregnancy.  Zoster vaccine.** / 1 dose for adults aged 26 years or older.  Measles, mumps, rubella (MMR) vaccine.** / You need at least 1 dose of MMR if you were born in 1957 or later. You may also need a 2nd dose. For females of childbearing age, rubella immunity should be determined. If there is no evidence of immunity, females who are not pregnant should be vaccinated. If there is no evidence of immunity, females who are pregnant should delay immunization until after pregnancy.  Pneumococcal 13-valent conjugate (PCV13) vaccine.** / Consult your health care provider.  Pneumococcal polysaccharide (PPSV23) vaccine.** / 1 to 2 doses if you smoke cigarettes or if you have certain conditions.  Meningococcal vaccine.** / Consult your health care provider.  Hepatitis  A vaccine.** / Consult your health care provider.  Hepatitis B vaccine.** / Consult your health care provider.  Haemophilus influenzae type b (Hib) vaccine.** / Consult your health care provider. Ages 29 years and over  Blood pressure check.** / Every 1 to 2 years.  Lipid and cholesterol check.** / Every 5 years beginning at age 64 years.  Lung cancer screening. / Every year if you are aged 60-80 years and have a 30-pack-year history  of smoking and currently smoke or have quit within the past 15 years. Yearly screening is stopped once you have quit smoking for at least 15 years or develop a health problem that would prevent you from having lung cancer treatment.  Clinical breast exam.** / Every year after age 70 years.  BRCA-related cancer risk assessment.** / For women who have family members with a BRCA-related cancer (breast, ovarian, tubal, or peritoneal cancers).  Mammogram.** / Every year beginning at age 40 years and continuing for as long as you are in good health. Consult with your health care provider.  Pap test.** / Every 3 years starting at age 81 years through age 69 or 5 years with 3 consecutive normal Pap tests. Testing can be stopped between 65 and 70 years with 3 consecutive normal Pap tests and no abnormal Pap or HPV tests in the past 10 years.  HPV screening.** / Every 3 years from ages 13 years through ages 43 or 75 years with a history of 3 consecutive normal Pap tests. Testing can be stopped between 65 and 70 years with 3 consecutive normal Pap tests and no abnormal Pap or HPV tests in the past 10 years.  Fecal occult blood test (FOBT) of stool. / Every year beginning at age 1 years and continuing until age 61 years. You may not need to do this test if you get a colonoscopy every 10 years.  Flexible sigmoidoscopy or colonoscopy.** / Every 5 years for a flexible sigmoidoscopy or every 10 years for a colonoscopy beginning at age 24 years and continuing until age 42 years.  Hepatitis C blood test.** / For all people born from [birth year] through 1966 and any individual with known risks for hepatitis C.  Osteoporosis screening.** / A one-time screening for women ages 15 years and over and women at risk for fractures or osteoporosis.  Skin self-exam. / Monthly.  Influenza vaccine. / Every year.  Tetanus, diphtheria, and acellular pertussis (Tdap/Td) vaccine.** / 1 dose of Td every 10 years.  Varicella  vaccine.** / Consult your health care provider.  Zoster vaccine.** / 1 dose for adults aged 83 years or older.  Pneumococcal 13-valent conjugate (PCV13) vaccine.** / Consult your health care provider.  Pneumococcal polysaccharide (PPSV23) vaccine.** / 1 dose for all adults aged 11 years and older.  Meningococcal vaccine.** / Consult your health care provider.  Hepatitis A vaccine.** / Consult your health care provider.  Hepatitis B vaccine.** / Consult your health care provider.  Haemophilus influenzae type b (Hib) vaccine.** / Consult your health care provider. ** Family history and personal history of risk and conditions may change your health care provider's recommendations. Document Released: 11/28/2001 Document Revised: 02/16/2014 Document Reviewed: 02/27/2011 [facility] Patient Information 2015 [place], [place]. This information is not intended to replace advice given to you by your health care provider. Make sure you discuss any questions you have with your health care provider.

## 2015-05-13 NOTE — Assessment & Plan Note (Signed)
Well controlled, no changes to meds. Encouraged heart healthy diet such as the DASH diet and exercise as tolerated.  °

## 2015-05-13 NOTE — Assessment & Plan Note (Signed)
Exercising regularly

## 2015-05-13 NOTE — Progress Notes (Signed)
Pre visit review using our clinic review tool, if applicable. No additional management support is needed unless otherwise documented below in the visit note. 

## 2015-05-13 NOTE — Assessment & Plan Note (Signed)
Encouraged heart healthy diet, increase exercise, avoid trans fats, consider a krill oil cap daily 

## 2015-05-23 ENCOUNTER — Encounter: Payer: Self-pay | Admitting: Family Medicine

## 2015-05-23 NOTE — Progress Notes (Signed)
Mandy [name]  [id number] 26-Jul-1946 05/23/2015      Progress Note-Follow Up  Subjective  Chief Complaint  Chief Complaint  Patient presents with  . Medicare Wellness    HPI  Patient is a 69 y.o. female in today for routine medical care. She is in today for annual exam and follow-up on numerous conditions. She generally feels well. No recent acute illness or new concerns. She is trying to maintain a heart healthy diet and stay active. Denies CP/palp/SOB/HA/congestion/fevers/GI or GU c/o. Taking meds as prescribed  Past Medical History  Diagnosis Date  . Osteopenia 10/17/1999  . Chicken pox as a child  . Measles as a child  . Miscarriage 1981  . Screening for malignant neoplasm of the cervix 04/26/2011  . Dermatitis 04/26/2011  . Onychomycosis 04/26/2011  . Atrophic vaginitis 11/18/2012  . HTN (hypertension) 04/13/2010    Qualifier: Diagnosis of  By: Charlett Blake MD, Erline Levine    . Tinea pedis 05/10/2014    Past Surgical History  Procedure Laterality Date  . Dilation and curettage of uterus  1981    miscarriage    Family History  Problem Relation Age of Onset  . Osteoporosis Mother   . Coronary artery disease Mother   . Heart attack Mother 72  . Hypertension Mother   . Diabetes Father     type 2  . Heart disease Father   . Stroke Father   . Dementia Maternal Grandmother   . Asthma Maternal Grandfather   . Cancer Paternal Grandmother     ovarian  . Diabetes Paternal Grandfather     tyoe 2  . Hearing loss Paternal Grandfather   . Arthritis Other   . Hyperlipidemia Other   . Hypertension Other   . Other Other     Cardiovascular disorder    History   Social History  . Marital Status: Married    Spouse Name: N/A  . Number of Children: N/A  . Years of Education: N/A   Occupational History  . bookkeeper    Social History Main Topics  . Smoking status: Former Smoker -- 1.00 packs/day    Types: Cigarettes    Quit date: 10/16/2000  . Smokeless tobacco: Never Used  .  Alcohol Use: No  . Drug Use: No  . Sexual Activity:    Partners: Male   Other Topics Concern  . Not on file   Social History Narrative   Regular exercise- yes   Does Yoga 3-4 X week, personal trainer 1 X week, and walks regularly   Has lost 16 pounds by minimizing processed foods and meats    Current Outpatient Prescriptions on File Prior to Visit  Medication Sig Dispense Refill  . aspirin 81 MG tablet Take 81 mg by mouth daily.    Marland Kitchen augmented betamethasone dipropionate (DIPROLENE-AF) 0.05 % ointment Apply topically 2 (two) times daily.    Marland Kitchen b complex vitamins tablet Take 1 tablet by mouth daily.    . calcium citrate-vitamin D (CITRACAL+D) 315-200 MG-UNIT per tablet Take 1 tablet by mouth 2 (two) times daily.      Marland Kitchen ketoconazole (NIZORAL) 2 % cream Apply 1 application topically daily.    Marland Kitchen losartan (COZAAR) 50 MG tablet TAKE 1 TABLET ONCE DAILY. 90 tablet 0  . multivitamin (THERAGRAN) per tablet Take 1 tablet by mouth daily.      . mupirocin nasal ointment (BACTROBAN) 2 % Apply to nares via Qtip every night at bedtime x 10 days. 10 g 0  .  Omega-3 Fatty Acids-Vitamin E (COROMEGA PO) Take by mouth.    Marland Kitchen PREMARIN vaginal cream APPLY A SMALL AMOUNT PER VAGINA 3 TIMES A WEEK AT BEDTIME 30 g 1  . psyllium (METAMUCIL) 0.52 G capsule Take 2.6 g by mouth daily. 5 capsules    . triamcinolone ointment (KENALOG) 0.1 % Apply to hands qhs prn dermatitis and place  On damp gloves 30 g 2  . vitamin C (ASCORBIC ACID) 500 MG tablet Take 500 mg by mouth daily.     No current facility-administered medications on file prior to visit.    Allergies  Allergen Reactions  . Codeine   . Pneumococcal Vaccine Polyvalent     REACTION: rash at injection site and induration.    Review of Systems  Review of Systems  Constitutional: Negative for fever, chills and malaise/fatigue.  HENT: Negative for congestion, hearing loss and nosebleeds.   Eyes: Negative for discharge.  Respiratory: Negative for cough,  sputum production, shortness of breath and wheezing.   Cardiovascular: Negative for chest pain, palpitations and leg swelling.  Gastrointestinal: Negative for heartburn, nausea, vomiting, abdominal pain, diarrhea, constipation and blood in stool.  Genitourinary: Negative for dysuria, urgency, frequency and hematuria.  Musculoskeletal: Positive for back pain and joint pain. Negative for myalgias and falls.  Skin: Negative for rash.  Neurological: Negative for dizziness, tremors, sensory change, focal weakness, loss of consciousness, weakness and headaches.  Endo/Heme/Allergies: Negative for polydipsia. Does not bruise/bleed easily.  Psychiatric/Behavioral: Negative for depression and suicidal ideas. The patient is not nervous/anxious and does not have insomnia.     Objective  BP 130/90 mmHg  Pulse 70  Temp(Src) 97.9 F (36.6 C) (Oral)  Ht 5\' 2"  (1.575 m)  Wt 156 lb 2 oz (70.818 kg)  BMI 28.55 kg/m2  SpO2 96%  Physical Exam  Physical Exam  Constitutional: She is oriented to person, place, and time and well-developed, well-nourished, and in no distress. No distress.  HENT:  Head: Normocephalic and atraumatic.  Right Ear: External ear normal.  Left Ear: External ear normal.  Nose: Nose normal.  Mouth/Throat: Oropharynx is clear and moist. No oropharyngeal exudate.  Eyes: Conjunctivae are normal. Pupils are equal, round, and reactive to light. Right eye exhibits no discharge. Left eye exhibits no discharge. No scleral icterus.  Neck: Normal range of motion. Neck supple. No thyromegaly present.  Cardiovascular: Normal rate, regular rhythm, normal heart sounds and intact distal pulses.   No murmur heard. Pulmonary/Chest: Effort normal and breath sounds normal. No respiratory distress. She has no wheezes. She has no rales.  Abdominal: Soft. Bowel sounds are normal. She exhibits no distension and no mass. There is no tenderness.  Musculoskeletal: Normal range of motion. She exhibits no  edema or tenderness.  Lymphadenopathy:    She has no cervical adenopathy.  Neurological: She is alert and oriented to person, place, and time. She has normal reflexes. No cranial nerve deficit. Coordination normal.  Skin: Skin is warm and dry. No rash noted. She is not diaphoretic.  Psychiatric: Mood, memory and affect normal.    Lab Results  Component Value Date   TSH 2.29 05/13/2015   Lab Results  Component Value Date   WBC 5.4 05/13/2015   HGB 14.0 05/13/2015   HCT 42.0 05/13/2015   MCV 97.1 05/13/2015   PLT 343.0 05/13/2015   Lab Results  Component Value Date   CREATININE 0.86 05/13/2015   BUN 17 05/13/2015   NA 136 05/13/2015   K 4.1 05/13/2015  CL 100 05/13/2015   CO2 33* 05/13/2015   Lab Results  Component Value Date   ALT 12 05/13/2015   AST 19 05/13/2015   ALKPHOS 43 05/13/2015   BILITOT 0.7 05/13/2015   Lab Results  Component Value Date   CHOL 233* 05/13/2015   Lab Results  Component Value Date   HDL 68.10 05/13/2015   Lab Results  Component Value Date   LDLCALC 152* 05/13/2015   Lab Results  Component Value Date   TRIG 63.0 05/13/2015   Lab Results  Component Value Date   CHOLHDL 3 05/13/2015     Assessment & Plan  Mixed hyperlipidemia Encouraged heart healthy diet, increase exercise, avoid trans fats, consider a krill oil cap daily  HTN (hypertension) Well controlled, no changes to meds. Encouraged heart healthy diet such as the DASH diet and exercise as tolerated.   Vitamin D deficiency Vitamin D checked today, wnl, continue supplements  Disorder of bone and cartilage Exercising regularly.   Screening for malignant neoplasm of cervix Pap last year normal  Medicare annual wellness visit, subsequent Sees opthamology, Dr Juanito Doom at [facility] dermatology, Dr Rolm Bookbinder Sees Gastroenterology, Dr Debbrah Alar in [place], last colonoscopy 2013 [facility] on 06/23/2014 Pap on 05/07/2014 Colonoscopy   07/11/2012 Patient denies any difficulties at home. No trouble with ADLs, depression or falls. No recent changes to vision or hearing. Is UTD with immunizations. Is UTD with screening. Discussed Advanced Directives, patient agrees to bring [place] copies of documents if can. Encouraged heart healthy diet, exercise as tolerated and adequate sleep Given and reviewed copy of ACP documents from [employer] and encouraged to complete and return

## 2015-07-05 ENCOUNTER — Other Ambulatory Visit: Payer: Self-pay | Admitting: Family Medicine

## 2015-07-30 ENCOUNTER — Ambulatory Visit (INDEPENDENT_AMBULATORY_CARE_PROVIDER_SITE_OTHER): Payer: Medicare HMO

## 2015-07-30 DIAGNOSIS — Z23 Encounter for immunization: Secondary | ICD-10-CM

## 2015-08-04 DIAGNOSIS — M25519 Pain in unspecified shoulder: Secondary | ICD-10-CM | POA: Diagnosis not present

## 2015-08-04 DIAGNOSIS — M25559 Pain in unspecified hip: Secondary | ICD-10-CM | POA: Diagnosis not present

## 2015-09-14 DIAGNOSIS — R69 Illness, unspecified: Secondary | ICD-10-CM | POA: Diagnosis not present

## 2015-09-18 ENCOUNTER — Telehealth: Payer: Self-pay | Admitting: Physician Assistant

## 2015-09-18 DIAGNOSIS — J208 Acute bronchitis due to other specified organisms: Secondary | ICD-10-CM

## 2015-09-18 MED ORDER — BENZONATATE 100 MG PO CAPS: 100.0000 mg | ORAL_CAPSULE | Freq: Three times a day (TID) | ORAL | Status: DC | PRN

## 2015-09-18 NOTE — Progress Notes (Signed)
We are sorry that you are not feeling well.  Here is how we plan to help!  Based on what you have shared with me it looks like you have upper respiratory tract inflammation that has resulted in a significant cough.  Inflammation and infection in the upper respiratory tract is commonly called bronchitis and has four common causes:  Allergies, Viral Infections, Acid Reflux and Bacterial Infections.  Allergies, viruses and acid reflux are treated by controlling symptoms or eliminating the cause. An example might be a cough caused by taking certain blood pressure medications. You stop the cough by changing the medication. Another example might be a cough caused by acid reflux. Controlling the reflux helps control the cough.  Based on your presentation I believe you most likely have A cough due to a virus.  This is called viral bronchitis and is best treated by rest, plenty of fluids and control of the cough.  You may use Ibuprofen or Tylenol as directed to help your symptoms.    In addition you may use A prescription cough medication called Tessalon Perles 100mg. You may take 1-2 capsules every 8 hours as needed for your cough.    HOME CARE . Only take medications as instructed by your medical team. . Complete the entire course of an antibiotic. . Drink plenty of fluids and get plenty of rest. . Avoid close contacts especially the very young and the elderly . Cover your mouth if you cough or cough into your sleeve. . Always remember to wash your hands . A steam or ultrasonic humidifier can help congestion.    GET HELP RIGHT AWAY IF: . You develop worsening fever. . You become short of breath . You cough up blood. . Your symptoms persist after you have completed your treatment plan MAKE SURE YOU   Understand these instructions.  Will watch your condition.  Will get help right away if you are not doing well or get worse.  Your e-visit answers were reviewed by a board certified advanced  clinical practitioner to complete your personal care plan.  Depending on the condition, your plan could have included both over the counter or prescription medications. If there is a problem please reply  once you have received a response from your provider. Your safety is important to us.  If you have drug allergies check your prescription carefully.    You can use MyChart to ask questions about today's visit, request a non-urgent call back, or ask for a work or school excuse for 24 hours related to this e-Visit. If it has been greater than 24 hours you will need to follow up with your provider, or enter a new e-Visit to address those concerns. You will get an e-mail in the next two days asking about your experience.  I hope that your e-visit has been valuable and will speed your recovery. Thank you for using e-visits.   

## 2015-11-12 ENCOUNTER — Ambulatory Visit: Payer: Medicare HMO | Admitting: Family Medicine

## 2015-12-21 ENCOUNTER — Ambulatory Visit (INDEPENDENT_AMBULATORY_CARE_PROVIDER_SITE_OTHER): Payer: Medicare HMO | Admitting: Family Medicine

## 2015-12-21 ENCOUNTER — Encounter: Payer: Self-pay | Admitting: Family Medicine

## 2015-12-21 VITALS — BP 122/80 | HR 56 | Temp 97.8°F | Ht 62.0 in | Wt 169.4 lb

## 2015-12-21 DIAGNOSIS — I10 Essential (primary) hypertension: Secondary | ICD-10-CM | POA: Diagnosis not present

## 2015-12-21 DIAGNOSIS — E559 Vitamin D deficiency, unspecified: Secondary | ICD-10-CM | POA: Diagnosis not present

## 2015-12-21 DIAGNOSIS — Z1159 Encounter for screening for other viral diseases: Secondary | ICD-10-CM

## 2015-12-21 DIAGNOSIS — M949 Disorder of cartilage, unspecified: Secondary | ICD-10-CM

## 2015-12-21 DIAGNOSIS — E782 Mixed hyperlipidemia: Secondary | ICD-10-CM

## 2015-12-21 DIAGNOSIS — Z23 Encounter for immunization: Secondary | ICD-10-CM

## 2015-12-21 DIAGNOSIS — M899 Disorder of bone, unspecified: Secondary | ICD-10-CM | POA: Diagnosis not present

## 2015-12-21 LAB — COMPREHENSIVE METABOLIC PANEL
ALBUMIN: 4.1 g/dL (ref 3.5–5.2)
ALK PHOS: 45 U/L (ref 39–117)
ALT: 10 U/L (ref 0–35)
AST: 17 U/L (ref 0–37)
BUN: 17 mg/dL (ref 6–23)
CO2: 29 mEq/L (ref 19–32)
CREATININE: 0.91 mg/dL (ref 0.40–1.20)
Calcium: 9.5 mg/dL (ref 8.4–10.5)
Chloride: 101 mEq/L (ref 96–112)
GFR: 64.78 mL/min (ref >60.00)
GLUCOSE: 83 mg/dL (ref 70–99)
Potassium: 4.4 mEq/L (ref 3.5–5.1)
SODIUM: 138 meq/L (ref 135–145)
TOTAL PROTEIN: 6.7 g/dL (ref 6.0–8.3)
Total Bilirubin: 0.7 mg/dL (ref 0.2–1.2)

## 2015-12-21 LAB — LIPID PANEL
CHOLESTEROL: 214 mg/dL — AB (ref 0–200)
HDL: 65.9 mg/dL (ref >39.00)
LDL CALC: 129 mg/dL — AB (ref 0–99)
NONHDL: 147.76
Total CHOL/HDL Ratio: 3
Triglycerides: 95 mg/dL (ref 0.0–149.0)
VLDL: 19 mg/dL (ref 0.0–40.0)

## 2015-12-21 LAB — CBC
HCT: 41.3 % (ref 36.0–46.0)
Hemoglobin: 14 g/dL (ref 12.0–15.0)
MCHC: 33.9 g/dL (ref 30.0–36.0)
MCV: 94.5 fl (ref 78.0–100.0)
Platelets: 320 10*3/uL (ref 150.0–400.0)
RBC: 4.38 Mil/uL (ref 3.87–5.11)
RDW: 13.8 % (ref 11.5–15.5)
WBC: 4.6 10*3/uL (ref 4.0–10.5)

## 2015-12-21 LAB — VITAMIN D 25 HYDROXY (VIT D DEFICIENCY, FRACTURES): VITD: 43.92 ng/mL (ref 30.00–100.00)

## 2015-12-21 LAB — TSH: TSH: 2.59 u[IU]/mL (ref 0.35–4.50)

## 2015-12-21 NOTE — Assessment & Plan Note (Signed)
Well controlled, no changes to meds. Encouraged heart healthy diet such as the DASH diet and exercise as tolerated.  °

## 2015-12-21 NOTE — Assessment & Plan Note (Signed)
Encouraged heart healthy diet, increase exercise, avoid trans fats, consider a krill oil cap daily 

## 2015-12-21 NOTE — Progress Notes (Addendum)
Patient ID: Mandy [name], female   DOB: 1946-09-09, 70 y.o.   MRN: [id number]   Subjective:    Patient ID: Mandy [name], female    DOB: 07-Sep-1946, 70 y.o.   MRN: [id number]  Chief Complaint  Patient presents with  . Follow-up    HPI Patient is in today for follow up. No recent illness. Struggles with fatigue but no other concerns. Has been staying active and trying to maintain a heart healthy diet. Denies CP/palp/SOB/HA/congestion/fevers/GI or GU c/o. Taking meds as prescribed  Past Medical History  Diagnosis Date  . Osteopenia 10/17/1999  . Chicken pox as a child  . Measles as a child  . Miscarriage 1981  . Screening for malignant neoplasm of the cervix 04/26/2011  . Dermatitis 04/26/2011  . Onychomycosis 04/26/2011  . Atrophic vaginitis 11/18/2012  . HTN (hypertension) 04/13/2010    Qualifier: Diagnosis of  By: Charlett Blake MD, Erline Levine    . Tinea pedis 05/10/2014    Past Surgical History  Procedure Laterality Date  . Dilation and curettage of uterus  1981    miscarriage    Family History  Problem Relation Age of Onset  . Osteoporosis Mother   . Coronary artery disease Mother   . Heart attack Mother 59  . Hypertension Mother   . Diabetes Father     type 2  . Heart disease Father   . Stroke Father   . Dementia Maternal Grandmother   . Asthma Maternal Grandfather   . Cancer Paternal Grandmother     ovarian  . Diabetes Paternal Grandfather     tyoe 2  . Hearing loss Paternal Grandfather   . Arthritis Other   . Hyperlipidemia Other   . Hypertension Other   . Other Other     Cardiovascular disorder    Social History   Social History  . Marital Status: Married    Spouse Name: N/A  . Number of Children: N/A  . Years of Education: N/A   Occupational History  . bookkeeper    Social History Main Topics  . Smoking status: Former Smoker -- 1.00 packs/day    Types: Cigarettes    Quit date: 10/16/2000  . Smokeless tobacco: Never Used  . Alcohol Use: No  . Drug Use: No  .  Sexual Activity:    Partners: Male   Other Topics Concern  . Not on file   Social History Narrative   Regular exercise- yes   Does Yoga 3-4 X week, personal trainer 1 X week, and walks regularly   Has lost 16 pounds by minimizing processed foods and meats    Outpatient Prescriptions Prior to Visit  Medication Sig Dispense Refill  . aspirin 81 MG tablet Take 81 mg by mouth daily.    Marland Kitchen augmented betamethasone dipropionate (DIPROLENE-AF) 0.05 % ointment Apply topically 2 (two) times daily.    Marland Kitchen b complex vitamins tablet Take 1 tablet by mouth daily.    . calcium citrate-vitamin D (CITRACAL+D) 315-200 MG-UNIT per tablet Take 1 tablet by mouth 2 (two) times daily.      Marland Kitchen ketoconazole (NIZORAL) 2 % cream Apply 1 application topically daily.    Marland Kitchen losartan (COZAAR) 50 MG tablet TAKE 1 TABLET ONCE DAILY. 90 tablet 2  . multivitamin (THERAGRAN) per tablet Take 1 tablet by mouth daily.      . mupirocin nasal ointment (BACTROBAN) 2 % Apply to nares via Qtip every night at bedtime x 10 days. 10 g 0  . Omega-3 Fatty  Acids-Vitamin E (COROMEGA PO) Take by mouth.    Marland Kitchen PREMARIN vaginal cream APPLY A SMALL AMOUNT PER VAGINA 3 TIMES A WEEK AT BEDTIME 30 g 1  . triamcinolone ointment (KENALOG) 0.1 % Apply to hands qhs prn dermatitis and place  On damp gloves 30 g 2  . vitamin C (ASCORBIC ACID) 500 MG tablet Take 500 mg by mouth daily.    . benzonatate (TESSALON) 100 MG capsule Take 1 capsule (100 mg total) by mouth 3 (three) times daily as needed for cough. 30 capsule 0  . psyllium (METAMUCIL) 0.52 G capsule Take 2.6 g by mouth daily. 5 capsules     No facility-administered medications prior to visit.    Allergies  Allergen Reactions  . Codeine   . Pneumococcal Vaccine Polyvalent     REACTION: rash at injection site and induration.    Review of Systems  Constitutional: Negative for fever and malaise/fatigue.  HENT: Negative for congestion.   Eyes: Negative for blurred vision.  Respiratory:  Negative for shortness of breath.   Cardiovascular: Negative for chest pain, palpitations and leg swelling.  Gastrointestinal: Negative for nausea, abdominal pain and blood in stool.  Genitourinary: Negative for dysuria and frequency.  Musculoskeletal: Negative for falls.  Skin: Negative for rash.  Neurological: Negative for dizziness, loss of consciousness and headaches.  Endo/Heme/Allergies: Negative for environmental allergies.  Psychiatric/Behavioral: Negative for depression. The patient is not nervous/anxious.        Objective:    Physical Exam  Constitutional: She is oriented to person, place, and time. She appears well-developed and well-nourished. No distress.  HENT:  Head: Normocephalic and atraumatic.  Nose: Nose normal.  Eyes: Right eye exhibits no discharge. Left eye exhibits no discharge.  Neck: Normal range of motion. Neck supple.  Cardiovascular: Normal rate and regular rhythm.   No murmur heard. Pulmonary/Chest: Effort normal and breath sounds normal.  Abdominal: Soft. Bowel sounds are normal. There is no tenderness.  Musculoskeletal: She exhibits no edema.  Neurological: She is alert and oriented to person, place, and time.  Skin: Skin is warm and dry.  Psychiatric: She has a normal mood and affect.  Nursing note and vitals reviewed.   BP 122/80 mmHg  Pulse 56  Temp(Src) 97.8 F (36.6 C) (Oral)  Ht 5\' 2"  (1.575 m)  Wt 169 lb 6 oz (76.828 kg)  BMI 30.97 kg/m2  SpO2 96% Wt Readings from Last 3 Encounters:  12/21/15 169 lb 6 oz (76.828 kg)  05/13/15 156 lb 2 oz (70.818 kg)  10/13/14 157 lb (71.215 kg)     Lab Results  Component Value Date   WBC 4.6 12/21/2015   HGB 14.0 12/21/2015   HCT 41.3 12/21/2015   PLT 320.0 12/21/2015   GLUCOSE 83 12/21/2015   CHOL 214* 12/21/2015   TRIG 95.0 12/21/2015   HDL 65.90 12/21/2015   LDLDIRECT 113.4 11/12/2012   LDLCALC 129* 12/21/2015   ALT 10 12/21/2015   AST 17 12/21/2015   NA 138 12/21/2015   K 4.4  12/21/2015   CL 101 12/21/2015   CREATININE 0.91 12/21/2015   BUN 17 12/21/2015   CO2 29 12/21/2015   TSH 2.59 12/21/2015    Lab Results  Component Value Date   TSH 2.59 12/21/2015   Lab Results  Component Value Date   WBC 4.6 12/21/2015   HGB 14.0 12/21/2015   HCT 41.3 12/21/2015   MCV 94.5 12/21/2015   PLT 320.0 12/21/2015   Lab Results  Component Value Date  NA 138 12/21/2015   K 4.4 12/21/2015   CO2 29 12/21/2015   GLUCOSE 83 12/21/2015   BUN 17 12/21/2015   CREATININE 0.91 12/21/2015   BILITOT 0.7 12/21/2015   ALKPHOS 45 12/21/2015   AST 17 12/21/2015   ALT 10 12/21/2015   PROT 6.7 12/21/2015   ALBUMIN 4.1 12/21/2015   CALCIUM 9.5 12/21/2015   GFR 64.78 12/21/2015   Lab Results  Component Value Date   CHOL 214* 12/21/2015   Lab Results  Component Value Date   HDL 65.90 12/21/2015   Lab Results  Component Value Date   LDLCALC 129* 12/21/2015   Lab Results  Component Value Date   TRIG 95.0 12/21/2015   Lab Results  Component Value Date   CHOLHDL 3 12/21/2015   No results found for: HGBA1C     Assessment & Plan:   Problem List Items Addressed This Visit    Disorder of bone and cartilage   Relevant Orders   VITAMIN D 25 Hydroxy (Vit-D Deficiency, Fractures) (Completed)   TSH (Completed)   CBC (Completed)   Comprehensive metabolic panel (Completed)   Lipid panel (Completed)   HTN (hypertension) - Primary    Well controlled, no changes to meds. Encouraged heart healthy diet such as the DASH diet and exercise as tolerated.       Relevant Orders   VITAMIN D 25 Hydroxy (Vit-D Deficiency, Fractures) (Completed)   TSH (Completed)   CBC (Completed)   Comprehensive metabolic panel (Completed)   Lipid panel (Completed)   Mixed hyperlipidemia    Encouraged heart healthy diet, increase exercise, avoid trans fats, consider a krill oil cap daily      Relevant Orders   VITAMIN D 25 Hydroxy (Vit-D Deficiency, Fractures) (Completed)   TSH  (Completed)   CBC (Completed)   Comprehensive metabolic panel (Completed)   Lipid panel (Completed)   Vitamin D deficiency    Encouraged daily vitamin D supplements.      Relevant Orders   VITAMIN D 25 Hydroxy (Vit-D Deficiency, Fractures) (Completed)   TSH (Completed)   CBC (Completed)   Comprehensive metabolic panel (Completed)   Lipid panel (Completed)    Other Visit Diagnoses    Need for hepatitis C screening test        Relevant Orders    Hepatitis C antibody (Completed)    Need for 23-polyvalent pneumococcal polysaccharide vaccine        Relevant Orders    Pneumococcal polysaccharide vaccine 23-valent greater than or equal to 2yo subcutaneous/IM (Completed)       I have discontinued Ms. Arlington's psyllium and benzonatate. I am also having her maintain her multivitamin, calcium citrate-vitamin D, vitamin C, b complex vitamins, aspirin, triamcinolone ointment, ketoconazole, augmented betamethasone dipropionate, Omega-3 Fatty Acids-Vitamin E (COROMEGA PO), mupirocin nasal ointment, PREMARIN, losartan, Melatonin, Probiotic Product (PROBIOTIC DAILY PO), and TURMERIC PO.  Meds ordered this encounter  Medications  . Melatonin 10 MG CAPS    Sig: Take by mouth at bedtime.  . Probiotic Product (PROBIOTIC DAILY PO)    Sig: Take by mouth daily.  . TURMERIC PO    Sig: Take by mouth daily.     Penni Homans, MD

## 2015-12-21 NOTE — Progress Notes (Signed)
Pre visit review using our clinic review tool, if applicable. No additional management support is needed unless otherwise documented below in the visit note. 

## 2015-12-21 NOTE — Patient Instructions (Signed)
Hypertension Hypertension, commonly called high blood pressure, is when the force of blood pumping through your arteries is too strong. Your arteries are the blood vessels that carry blood from your heart throughout your body. A blood pressure reading consists of a higher number over a lower number, such as 110/72. The higher number (systolic) is the pressure inside your arteries when your heart pumps. The lower number (diastolic) is the pressure inside your arteries when your heart relaxes. Ideally you want your blood pressure below 120/80. Hypertension forces your heart to work harder to pump blood. Your arteries may become narrow or stiff. Having untreated or uncontrolled hypertension can cause heart attack, stroke, kidney disease, and other problems. RISK FACTORS Some risk factors for high blood pressure are controllable. Others are not.  Risk factors you cannot control include:   Race. You may be at higher risk if you are African American.  Age. Risk increases with age.  Gender. Men are at higher risk than women before age 45 years. After age 65, women are at higher risk than men. Risk factors you can control include:  Not getting enough exercise or physical activity.  Being overweight.  Getting too much fat, sugar, calories, or salt in your diet.  Drinking too much alcohol. SIGNS AND SYMPTOMS Hypertension does not usually cause signs or symptoms. Extremely high blood pressure (hypertensive crisis) may cause headache, anxiety, shortness of breath, and nosebleed. DIAGNOSIS To check if you have hypertension, your health care provider will measure your blood pressure while you are seated, with your arm held at the level of your heart. It should be measured at least twice using the same arm. Certain conditions can cause a difference in blood pressure between your right and left arms. A blood pressure reading that is higher than normal on one occasion does not mean that you need treatment. If  it is not clear whether you have high blood pressure, you may be asked to return on a different day to have your blood pressure checked again. Or, you may be asked to monitor your blood pressure at home for 1 or more weeks. TREATMENT Treating high blood pressure includes making lifestyle changes and possibly taking medicine. Living a healthy lifestyle can help lower high blood pressure. You may need to change some of your habits. Lifestyle changes may include:  Following the DASH diet. This diet is high in fruits, vegetables, and whole grains. It is low in salt, red meat, and added sugars.  Keep your sodium intake below 2,300 mg per day.  Getting at least 30-45 minutes of aerobic exercise at least 4 times per week.  Losing weight if necessary.  Not smoking.  Limiting alcoholic beverages.  Learning ways to reduce stress. Your health care provider may prescribe medicine if lifestyle changes are not enough to get your blood pressure under control, and if one of the following is true:  You are 18-59 years of age and your systolic blood pressure is above 140.  You are 60 years of age or older, and your systolic blood pressure is above 150.  Your diastolic blood pressure is above 90.  You have diabetes, and your systolic blood pressure is over 140 or your diastolic blood pressure is over 90.  You have kidney disease and your blood pressure is above 140/90.  You have heart disease and your blood pressure is above 140/90. Your personal target blood pressure may vary depending on your medical conditions, your age, and other factors. HOME CARE INSTRUCTIONS    Have your blood pressure rechecked as directed by your health care provider.   Take medicines only as directed by your health care provider. Follow the directions carefully. Blood pressure medicines must be taken as prescribed. The medicine does not work as well when you skip doses. Skipping doses also puts you at risk for  problems.  Do not smoke.   Monitor your blood pressure at home as directed by your health care provider. SEEK MEDICAL CARE IF:   You think you are having a reaction to medicines taken.  You have recurrent headaches or feel dizzy.  You have swelling in your ankles.  You have trouble with your vision. SEEK IMMEDIATE MEDICAL CARE IF:  You develop a severe headache or confusion.  You have unusual weakness, numbness, or feel faint.  You have severe chest or abdominal pain.  You vomit repeatedly.  You have trouble breathing. MAKE SURE YOU:   Understand these instructions.  Will watch your condition.  Will get help right away if you are not doing well or get worse.   This information is not intended to replace advice given to you by your health care provider. Make sure you discuss any questions you have with your health care provider.   Document Released: 10/02/2005 Document Revised: 02/16/2015 Document Reviewed: 07/25/2013 Elsevier Interactive Patient Education 2016 Else[employer] Inc[employer]  

## 2015-12-22 LAB — HEPATITIS C ANTIBODY: HCV Ab: NEGATIVE

## 2015-12-26 NOTE — Assessment & Plan Note (Signed)
Encouraged daily vitamin D supplements 

## 2016-01-05 DIAGNOSIS — M25519 Pain in unspecified shoulder: Secondary | ICD-10-CM | POA: Diagnosis not present

## 2016-01-05 DIAGNOSIS — M25559 Pain in unspecified hip: Secondary | ICD-10-CM | POA: Diagnosis not present

## 2016-01-12 DIAGNOSIS — M25559 Pain in unspecified hip: Secondary | ICD-10-CM | POA: Diagnosis not present

## 2016-01-12 DIAGNOSIS — M25519 Pain in unspecified shoulder: Secondary | ICD-10-CM | POA: Diagnosis not present

## 2016-01-13 DIAGNOSIS — H16102 Unspecified superficial keratitis, left eye: Secondary | ICD-10-CM | POA: Diagnosis not present

## 2016-01-13 DIAGNOSIS — H524 Presbyopia: Secondary | ICD-10-CM | POA: Diagnosis not present

## 2016-01-13 DIAGNOSIS — H04123 Dry eye syndrome of bilateral lacrimal glands: Secondary | ICD-10-CM | POA: Diagnosis not present

## 2016-01-13 DIAGNOSIS — H25813 Combined forms of age-related cataract, bilateral: Secondary | ICD-10-CM | POA: Diagnosis not present

## 2016-01-30 ENCOUNTER — Telehealth: Payer: Medicare HMO | Admitting: Family

## 2016-01-30 DIAGNOSIS — B9689 Other specified bacterial agents as the cause of diseases classified elsewhere: Secondary | ICD-10-CM

## 2016-01-30 DIAGNOSIS — J069 Acute upper respiratory infection, unspecified: Secondary | ICD-10-CM

## 2016-01-30 MED ORDER — BENZONATATE 100 MG PO CAPS: 100.0000 mg | ORAL_CAPSULE | Freq: Three times a day (TID) | ORAL | Status: DC | PRN

## 2016-01-30 MED ORDER — AMOXICILLIN-POT CLAVULANATE 875-125 MG PO TABS: 1.0000 | ORAL_TABLET | Freq: Two times a day (BID) | ORAL | Status: DC

## 2016-01-30 NOTE — Progress Notes (Signed)
We are sorry that you are not feeling well.  Here is how we plan to help!  Based on what you have shared with me it looks like you have sinusitis.  Sinusitis is inflammation and infection in the sinus cavities of the head.  Based on your presentation I believe you most likely have Acute Bacterial Sinusitis.  This is an infection caused by bacteria and is treated with antibiotics. I have prescribed Augmentin 875mg /125mg  one tablet twice daily with food, for 7 days. You may use an oral decongestant such as Mucinex D or if you have glaucoma or high blood pressure use plain Mucinex. Saline nasal spray help and can safely be used as often as needed for congestion.  If you develop worsening sinus pain, fever or notice severe headache and vision changes, or if symptoms are not better after completion of antibiotic, please schedule an appointment with a health care provider.    You asked about meds for cough: I have also prescribed Tessalon Perles, take 100mg , 1-2 capsules every 8 hours for cough as needed.   Sinus infections are not as easily transmitted as other respiratory infection, however we still recommend that you avoid close contact with loved ones, especially the very young and elderly.  Remember to wash your hands thoroughly throughout the day as this is the number one way to prevent the spread of infection!  Home Care:  Only take medications as instructed by your medical team.  Complete the entire course of an antibiotic.  Do not take these medications with alcohol.  A steam or ultrasonic humidifier can help congestion.  You can place a towel over your head and breathe in the steam from hot water coming from a faucet.  Avoid close contacts especially the very young and the elderly.  Cover your mouth when you cough or sneeze.  Always remember to wash your hands.  Get Help Right Away If:  You develop worsening fever or sinus pain.  You develop a severe head ache or visual  changes.  Your symptoms persist after you have completed your treatment plan.  Make sure you  Understand these instructions.  Will watch your condition.  Will get help right away if you are not doing well or get worse.  Your e-visit answers were reviewed by a board certified advanced clinical practitioner to complete your personal care plan.  Depending on the condition, your plan could have included both over the counter or prescription medications.  If there is a problem please reply  once you have received a response from your provider.  Your safety is important to [place].  If you have drug allergies check your prescription carefully.    You can use MyChart to ask questions about today's visit, request a non-urgent call back, or ask for a work or school excuse for 24 hours related to this e-Visit. If it has been greater than 24 hours you will need to follow up with your provider, or enter a new e-Visit to address those concerns.  You will get an e-mail in the next two days asking about your experience.  I hope that your e-visit has been valuable and will speed your recovery. Thank you for using e-visits.

## 2016-01-31 DIAGNOSIS — M19042 Primary osteoarthritis, left hand: Secondary | ICD-10-CM | POA: Diagnosis not present

## 2016-01-31 DIAGNOSIS — M65312 Trigger thumb, left thumb: Secondary | ICD-10-CM | POA: Diagnosis not present

## 2016-01-31 DIAGNOSIS — M1812 Unilateral primary osteoarthritis of first carpometacarpal joint, left hand: Secondary | ICD-10-CM | POA: Diagnosis not present

## 2016-02-07 ENCOUNTER — Ambulatory Visit (INDEPENDENT_AMBULATORY_CARE_PROVIDER_SITE_OTHER): Payer: Medicare HMO | Admitting: Family Medicine

## 2016-02-07 VITALS — BP 124/70 | HR 67 | Temp 97.7°F | Ht 63.0 in | Wt 164.2 lb

## 2016-02-07 DIAGNOSIS — R0989 Other specified symptoms and signs involving the circulatory and respiratory systems: Secondary | ICD-10-CM | POA: Diagnosis not present

## 2016-02-07 DIAGNOSIS — R0981 Nasal congestion: Secondary | ICD-10-CM | POA: Diagnosis not present

## 2016-02-07 MED ORDER — PREDNISONE 20 MG PO TABS: ORAL_TABLET | ORAL | Status: DC

## 2016-02-07 NOTE — Patient Instructions (Signed)
We will use prednisone to finish getting rid of your nasal and chest congestion Take 2 pills a day for 3 days, then 1 pill a day for 3 days more if needed Let me know if you do not feel better soon- Sooner if worse.   No ibuprofen or aleve (or other NSAIDS) while on the prednisone.  Tylenol is ok

## 2016-02-07 NOTE — Progress Notes (Signed)
[place] [facility] [address] [place], [place] [address] [phone]  Date:  02/07/2016   Name:  Mandy [name]   DOB:  Jun 21, 1946   MRN:  [id number]  PCP:  Penni Homans, MD    Chief Complaint: Nasal Congestion   History of Present Illness:  Mandy [name] is a 70 y.o. very pleasant female patient who presents with the following:  History of HTN- here today with illness that has been present for about 10 days total.  She is on cozaar. She did an evisit about 7 days ago and was tx with augmentin 875 BID for one week for sinus infection. She finished abx yesterday She notes some pain in the right side of her face, but she does not feel that bad at this time overall. She is just concerned that she is not well as of yet Her right ear may pop when she blows her nose.  No hearing change Some cough but not productive She has not noted any fever.  No aches or chills She did have a ST but this is now resolved.   No vomiting  She has used some nasal rinsing a few times a day.   Patient Active Problem List   Diagnosis Date Noted  . Medicare annual wellness visit, subsequent 05/10/2014  . Tinea pedis 05/10/2014  . Atrophic vaginitis 11/18/2012  . Screening for malignant neoplasm of cervix 04/26/2011  . Onychomycosis 04/26/2011  . Mixed hyperlipidemia 05/11/2010  . Vitamin D deficiency 04/13/2010  . CARPAL TUNNEL SYNDROME, RIGHT, MILD 04/13/2010  . TRIGGER FINGER, RIGHT THUMB 04/13/2010  . Disorder of bone and cartilage 04/13/2010  . HTN (hypertension) 04/13/2010  . MUMPS, Carter OF 04/13/2010  . CHICKENPOX, HX OF 04/13/2010  . FACIAL PAIN 01/11/2009    Past Medical History  Diagnosis Date  . Osteopenia 10/17/1999  . Chicken pox as a child  . Measles as a child  . Miscarriage 1981  . Screening for malignant neoplasm of the cervix 04/26/2011  . Dermatitis 04/26/2011  . Onychomycosis 04/26/2011  . Atrophic vaginitis 11/18/2012  .  HTN (hypertension) 04/13/2010    Qualifier: Diagnosis of  By: Charlett Blake MD, Erline Levine    . Tinea pedis 05/10/2014    Past Surgical History  Procedure Laterality Date  . Dilation and curettage of uterus  1981    miscarriage    Social History  Substance Use Topics  . Smoking status: Former Smoker -- 1.00 packs/day    Types: Cigarettes    Quit date: 10/16/2000  . Smokeless tobacco: Never Used  . Alcohol Use: No    Family History  Problem Relation Age of Onset  . Osteoporosis Mother   . Coronary artery disease Mother   . Heart attack Mother 51  . Hypertension Mother   . Diabetes Father     type 2  . Heart disease Father   . Stroke Father   . Dementia Maternal Grandmother   . Asthma Maternal Grandfather   . Cancer Paternal Grandmother     ovarian  . Diabetes Paternal Grandfather     tyoe 2  . Hearing loss Paternal Grandfather   . Arthritis Other   . Hyperlipidemia Other   . Hypertension Other   . Other Other     Cardiovascular disorder    Allergies  Allergen Reactions  . Codeine   . Pneumococcal Vaccine Polyvalent     REACTION: rash at injection site and  induration.    Medication list has been reviewed and updated.  Current Outpatient Prescriptions on File Prior to Visit  Medication Sig Dispense Refill  . aspirin 81 MG tablet Take 81 mg by mouth daily.    Marland Kitchen augmented betamethasone dipropionate (DIPROLENE-AF) 0.05 % ointment Apply topically 2 (two) times daily.    Marland Kitchen b complex vitamins tablet Take 1 tablet by mouth daily.    . benzonatate (TESSALON PERLES) 100 MG capsule Take 1-2 capsules (100-200 mg total) by mouth every 8 (eight) hours as needed. 30 capsule 0  . calcium citrate-vitamin D (CITRACAL+D) 315-200 MG-UNIT per tablet Take 1 tablet by mouth 2 (two) times daily.      Marland Kitchen ketoconazole (NIZORAL) 2 % cream Apply 1 application topically daily.    Marland Kitchen losartan (COZAAR) 50 MG tablet TAKE 1 TABLET ONCE DAILY. 90 tablet 2  . Melatonin 10 MG CAPS Take by mouth at bedtime.     . multivitamin (THERAGRAN) per tablet Take 1 tablet by mouth daily.      . mupirocin nasal ointment (BACTROBAN) 2 % Apply to nares via Qtip every night at bedtime x 10 days. 10 g 0  . Omega-3 Fatty Acids-Vitamin E (COROMEGA PO) Take by mouth.    Marland Kitchen PREMARIN vaginal cream APPLY A SMALL AMOUNT PER VAGINA 3 TIMES A WEEK AT BEDTIME 30 g 1  . Probiotic Product (PROBIOTIC DAILY PO) Take by mouth daily.    Marland Kitchen triamcinolone ointment (KENALOG) 0.1 % Apply to hands qhs prn dermatitis and place  On damp gloves 30 g 2  . TURMERIC PO Take by mouth daily.    . vitamin C (ASCORBIC ACID) 500 MG tablet Take 500 mg by mouth daily.     No current facility-administered medications on file prior to visit.    Review of Systems:  As per HPI- otherwise negative. She was recently exposed to her grandson who was sick   Physical Examination: Filed Vitals:   02/07/16 1308  BP: 124/70  Pulse: 67  Temp: 97.7 F (36.5 C)   Filed Vitals:   02/07/16 1308  Height: 5\' 3"  (1.6 m)  Weight: 164 lb 3.2 oz (74.481 kg)   Body mass index is 29.09 kg/(m^2). Ideal Body Weight: Weight in (lb) to have BMI = 25: 140.8  GEN: WDWN, NAD, Non-toxic, A & O x 3, looks well, overweight HEENT: Atraumatic, Normocephalic. Neck supple. No masses, No LAD.  Bilateral TM wnl, oropharynx normal.  PEERL,EOMI.   Nasal cavity congestion- mild.  Clear effusion right ear.   Mild right frontal sinus TTP Ears and Nose: No external deformity. CV: RRR, No M/G/R. No JVD. No thrill. No extra heart sounds. PULM: CTA B, no wheezes, crackles, rhonchi. No retractions. No resp. distress. No accessory muscle use. EXTR: No c/c/e NEURO Normal gait.  PSYCH: Normally interactive. Conversant. Not depressed or anxious appearing.  Calm demeanor.    Assessment and Plan: Sinus congestion - Plan: predniSONE (DELTASONE) 20 MG tablet  Chest congestion - Plan: predniSONE (DELTASONE) 20 MG tablet  Persistent sx following treatment with agumentin for  sinusitis.  At this time would not advise further abx.  Suggested watchful waiting but pt is concerned about persistent sx and would like to try anything that may help.  Will use prednisone in a brief burst.  She will let me know if not feeling better soon  Signed Lamar Blinks, MD

## 2016-02-07 NOTE — Progress Notes (Signed)
Pre visit review using our clinic tool,if applicable. No additional management support is needed unless otherwise documented below in the visit note.  

## 2016-03-01 DIAGNOSIS — M19042 Primary osteoarthritis, left hand: Secondary | ICD-10-CM | POA: Diagnosis not present

## 2016-03-01 DIAGNOSIS — M1812 Unilateral primary osteoarthritis of first carpometacarpal joint, left hand: Secondary | ICD-10-CM | POA: Diagnosis not present

## 2016-03-01 DIAGNOSIS — M65312 Trigger thumb, left thumb: Secondary | ICD-10-CM | POA: Diagnosis not present

## 2016-03-08 ENCOUNTER — Other Ambulatory Visit: Payer: Self-pay | Admitting: Family Medicine

## 2016-03-21 DIAGNOSIS — R69 Illness, unspecified: Secondary | ICD-10-CM | POA: Diagnosis not present

## 2016-04-14 ENCOUNTER — Other Ambulatory Visit: Payer: Self-pay | Admitting: Family Medicine

## 2016-04-27 ENCOUNTER — Ambulatory Visit (INDEPENDENT_AMBULATORY_CARE_PROVIDER_SITE_OTHER): Payer: Medicare HMO | Admitting: Medical

## 2016-04-27 ENCOUNTER — Encounter: Payer: Self-pay | Admitting: Medical

## 2016-04-27 VITALS — BP 122/78 | HR 77 | Temp 98.1°F | Resp 16 | Ht 62.0 in | Wt 162.0 lb

## 2016-04-27 DIAGNOSIS — L02511 Cutaneous abscess of right hand: Secondary | ICD-10-CM | POA: Diagnosis not present

## 2016-04-27 DIAGNOSIS — L089 Local infection of the skin and subcutaneous tissue, unspecified: Secondary | ICD-10-CM

## 2016-04-27 LAB — CBC WITH DIFFERENTIAL/PLATELET
BASOS ABS: 0.1 10*3/uL (ref 0.0–0.1)
BASOS PCT: 1 % (ref 0.0–3.0)
EOS ABS: 0.1 10*3/uL (ref 0.0–0.7)
Eosinophils Relative: 1.9 % (ref 0.0–5.0)
HCT: 40 % (ref 36.0–46.0)
HEMOGLOBIN: 13.6 g/dL (ref 12.0–15.0)
LYMPHS PCT: 29 % (ref 12.0–46.0)
Lymphs Abs: 1.9 10*3/uL (ref 0.7–4.0)
MCHC: 34 g/dL (ref 30.0–36.0)
MCV: 94.3 fl (ref 78.0–100.0)
MONO ABS: 0.7 10*3/uL (ref 0.1–1.0)
Monocytes Relative: 10.1 % (ref 3.0–12.0)
NEUTROS ABS: 3.8 10*3/uL (ref 1.4–7.7)
Neutrophils Relative %: 58 % (ref 43.0–77.0)
PLATELETS: 310 10*3/uL (ref 150.0–400.0)
RBC: 4.24 Mil/uL (ref 3.87–5.11)
RDW: 14.2 % (ref 11.5–15.5)
WBC: 6.6 10*3/uL (ref 4.0–10.5)

## 2016-04-27 NOTE — Patient Instructions (Addendum)
For your rt 3rd digit infection I decided to refer you to hand specialist. I was initially going to get you appointment on Friday as we  approach the weekend. But they stated they could see you today.   Hand specialist wanted stat cbc   For your 4th digit paronychia could still do the warm salt water compresses. Antibiotic the specialist will likely give you should help with paronychia as well  Follow up with [place] as needed

## 2016-04-27 NOTE — Progress Notes (Signed)
Subjective:    Patient ID: Mandy [name], female    DOB: 12-17-1945, 70 y.o.   MRN: [id number]  HPI   Pt in states on Monday night her 3rd digit started to get little tender and painful. Then gradually got worse. Yesterday really swollen. Now little less swollen today but still swollen. NO trauma or injury. But she thinks maybe bite. She was working in her garden this weekend. Occasionally removes gloves. Pt cant remember feeling any sharp prominent pain. Pt thought she had splint and was digging at finger with needle.(at dip joint).  Pt has seen hand specialist.  Also on left hand 4th digit side of nail became painful.   Pt last tetanus was 2011.  No allergies to antibiotics.      Review of Systems  Respiratory: Negative for cough, shortness of breath and wheezing.   Cardiovascular: Negative for chest pain and palpitations.  Musculoskeletal:       Rt 3rd digit pain and left 4th digit pain. See hpi.  Hematological: Negative for adenopathy. Does not bruise/bleed easily.  Psychiatric/Behavioral: Negative for behavioral problems and confusion.   Past Medical History  Diagnosis Date  . Osteopenia 10/17/1999  . Chicken pox as a child  . Measles as a child  . Miscarriage 1981  . Screening for malignant neoplasm of the cervix 04/26/2011  . Dermatitis 04/26/2011  . Onychomycosis 04/26/2011  . Atrophic vaginitis 11/18/2012  . HTN (hypertension) 04/13/2010    Qualifier: Diagnosis of  By: Charlett Blake MD, Erline Levine    . Tinea pedis 05/10/2014     Social History   Social History  . Marital Status: Married    Spouse Name: N/A  . Number of Children: N/A  . Years of Education: N/A   Occupational History  . bookkeeper    Social History Main Topics  . Smoking status: Former Smoker -- 1.00 packs/day    Types: Cigarettes    Quit date: 10/16/2000  . Smokeless tobacco: Never Used  . Alcohol Use: No  . Drug Use: No  . Sexual Activity:    Partners: Male   Other Topics Concern  . Not on file     Social History Narrative   Regular exercise- yes   Does Yoga 3-4 X week, personal trainer 1 X week, and walks regularly   Has lost 16 pounds by minimizing processed foods and meats    Past Surgical History  Procedure Laterality Date  . Dilation and curettage of uterus  1981    miscarriage    Family History  Problem Relation Age of Onset  . Osteoporosis Mother   . Coronary artery disease Mother   . Heart attack Mother 4  . Hypertension Mother   . Diabetes Father     type 2  . Heart disease Father   . Stroke Father   . Dementia Maternal Grandmother   . Asthma Maternal Grandfather   . Cancer Paternal Grandmother     ovarian  . Diabetes Paternal Grandfather     tyoe 2  . Hearing loss Paternal Grandfather   . Arthritis Other   . Hyperlipidemia Other   . Hypertension Other   . Other Other     Cardiovascular disorder    Allergies  Allergen Reactions  . Codeine   . Pneumococcal Vaccine Polyvalent     REACTION: rash at injection site and induration.    Current Outpatient Prescriptions on File Prior to Visit  Medication Sig Dispense Refill  . aspirin 81 MG tablet  Take 81 mg by mouth daily.    Marland Kitchen augmented betamethasone dipropionate (DIPROLENE-AF) 0.05 % ointment Apply topically 2 (two) times daily.    Marland Kitchen b complex vitamins tablet Take 1 tablet by mouth daily.    . calcium citrate-vitamin D (CITRACAL+D) 315-200 MG-UNIT per tablet Take 1 tablet by mouth 2 (two) times daily.      Marland Kitchen ketoconazole (NIZORAL) 2 % cream Apply 1 application topically daily.    Marland Kitchen losartan (COZAAR) 50 MG tablet TAKE 1 TABLET ONCE DAILY. 90 tablet 1  . Melatonin 10 MG CAPS Take by mouth at bedtime.    . multivitamin (THERAGRAN) per tablet Take 1 tablet by mouth daily.      . mupirocin nasal ointment (BACTROBAN) 2 % Apply to nares via Qtip every night at bedtime x 10 days. 10 g 0  . Omega-3 Fatty Acids-Vitamin E (COROMEGA PO) Take by mouth.    Marland Kitchen PREMARIN vaginal cream APPLY A SMALL AMOUNT PER VAGINA  3 TIMES A WEEK AT BEDTIME 30 g 0  . Probiotic Product (PROBIOTIC DAILY PO) Take by mouth daily.    . TURMERIC PO Take by mouth daily.    . vitamin C (ASCORBIC ACID) 500 MG tablet Take 500 mg by mouth daily.     No current facility-administered medications on file prior to visit.    BP 122/78 mmHg  Pulse 77  Temp(Src) 98.1 F (36.7 C) (Oral)  Resp 16  Ht 5\' 2"  (1.575 m)  Wt 162 lb (73.483 kg)  BMI 29.62 kg/m2  SpO2 98%       Objective:   Physical Exam   General- No acute distress. Pleasant patient. Lungs- Clear, even and unlabored. Heart- regular rate and rhythm. Neurologic- CNII- XII grossly intact.  Rt hand- 3rd digit. Form pip to tip of finder swollen and red. Tender. She can't bend her finger(after I made specialist appointment she demonstrated some movement. Dorsal aspect of finger is swollen. At dip area can see the area she digging at with needle.  Lt 4th digit- lateral aspect has small paronchia. Mild tender.      Assessment & Plan:  For your rt 3rd digit infection I decided to refer you to hand specialist. I was initially going to get you appointment on Friday as we  approach the weekend. But they stated they could see you today.   Hand specialist wanted stat cbc  For your 4th digit paronychia could still do the warm salt water compresses. Antibiotic the specialist will likely give you should help with paronychia as well.  Follow up with [place] as needed  Atheena Spano, Percell Miller, PA-C

## 2016-04-27 NOTE — Progress Notes (Signed)
Pre visit review using our clinic review tool, if applicable. No additional management support is needed unless otherwise documented below in the visit note. 

## 2016-05-01 DIAGNOSIS — M65312 Trigger thumb, left thumb: Secondary | ICD-10-CM | POA: Diagnosis not present

## 2016-05-01 DIAGNOSIS — M1812 Unilateral primary osteoarthritis of first carpometacarpal joint, left hand: Secondary | ICD-10-CM | POA: Diagnosis not present

## 2016-05-12 ENCOUNTER — Encounter: Payer: Medicare HMO | Admitting: Family Medicine

## 2016-07-14 ENCOUNTER — Other Ambulatory Visit: Payer: Self-pay | Admitting: Family Medicine

## 2016-07-14 ENCOUNTER — Ambulatory Visit (INDEPENDENT_AMBULATORY_CARE_PROVIDER_SITE_OTHER): Payer: Medicare HMO | Admitting: Family Medicine

## 2016-07-14 ENCOUNTER — Encounter: Payer: Self-pay | Admitting: Family Medicine

## 2016-07-14 ENCOUNTER — Ambulatory Visit (HOSPITAL_BASED_OUTPATIENT_CLINIC_OR_DEPARTMENT_OTHER)
Admission: RE | Admit: 2016-07-14 | Discharge: 2016-07-14 | Disposition: A | Discharge status: Home / Self Care | Payer: Medicare HMO | Source: Ambulatory Visit | Attending: Family Medicine | Admitting: Family Medicine

## 2016-07-14 VITALS — BP 108/72 | HR 64 | Temp 97.8°F | Ht 62.0 in | Wt 153.5 lb

## 2016-07-14 DIAGNOSIS — Z1239 Encounter for other screening for malignant neoplasm of breast: Secondary | ICD-10-CM

## 2016-07-14 DIAGNOSIS — Z1231 Encounter for screening mammogram for malignant neoplasm of breast: Secondary | ICD-10-CM | POA: Insufficient documentation

## 2016-07-14 DIAGNOSIS — R918 Other nonspecific abnormal finding of lung field: Secondary | ICD-10-CM | POA: Insufficient documentation

## 2016-07-14 DIAGNOSIS — E782 Mixed hyperlipidemia: Secondary | ICD-10-CM | POA: Diagnosis not present

## 2016-07-14 DIAGNOSIS — E559 Vitamin D deficiency, unspecified: Secondary | ICD-10-CM | POA: Diagnosis not present

## 2016-07-14 DIAGNOSIS — Z23 Encounter for immunization: Secondary | ICD-10-CM | POA: Diagnosis not present

## 2016-07-14 DIAGNOSIS — Z0000 Encounter for general adult medical examination without abnormal findings: Secondary | ICD-10-CM | POA: Diagnosis not present

## 2016-07-14 DIAGNOSIS — I10 Essential (primary) hypertension: Secondary | ICD-10-CM

## 2016-07-14 DIAGNOSIS — R05 Cough: Secondary | ICD-10-CM

## 2016-07-14 DIAGNOSIS — R059 Cough, unspecified: Secondary | ICD-10-CM

## 2016-07-14 HISTORY — DX: Cough, unspecified: R05.9

## 2016-07-14 HISTORY — DX: Encounter for general adult medical examination without abnormal findings: Z00.00

## 2016-07-14 LAB — LIPID PANEL
CHOLESTEROL: 203 mg/dL — AB (ref 0–200)
HDL: 57.1 mg/dL (ref >39.00)
LDL Cholesterol: 129 mg/dL — ABNORMAL HIGH (ref 0–99)
NONHDL: 146.39
Total CHOL/HDL Ratio: 4
Triglycerides: 86 mg/dL (ref 0.0–149.0)
VLDL: 17.2 mg/dL (ref 0.0–40.0)

## 2016-07-14 LAB — COMPREHENSIVE METABOLIC PANEL
ALK PHOS: 41 U/L (ref 39–117)
ALT: 10 U/L (ref 0–35)
AST: 18 U/L (ref 0–37)
Albumin: 3.7 g/dL (ref 3.5–5.2)
BILIRUBIN TOTAL: 0.7 mg/dL (ref 0.2–1.2)
BUN: 16 mg/dL (ref 6–23)
CALCIUM: 9.3 mg/dL (ref 8.4–10.5)
CO2: 33 meq/L — AB (ref 19–32)
CREATININE: 0.92 mg/dL (ref 0.40–1.20)
Chloride: 101 mEq/L (ref 96–112)
GFR: 63.86 mL/min (ref >60.00)
GLUCOSE: 83 mg/dL (ref 70–99)
Potassium: 4.9 mEq/L (ref 3.5–5.1)
Sodium: 137 mEq/L (ref 135–145)
TOTAL PROTEIN: 6.5 g/dL (ref 6.0–8.3)

## 2016-07-14 LAB — CBC
HCT: 42.4 % (ref 36.0–46.0)
HEMOGLOBIN: 14.4 g/dL (ref 12.0–15.0)
MCHC: 34 g/dL (ref 30.0–36.0)
MCV: 95.3 fl (ref 78.0–100.0)
PLATELETS: 339 10*3/uL (ref 150.0–400.0)
RBC: 4.45 Mil/uL (ref 3.87–5.11)
RDW: 13.9 % (ref 11.5–15.5)
WBC: 4.9 10*3/uL (ref 4.0–10.5)

## 2016-07-14 LAB — VITAMIN D 25 HYDROXY (VIT D DEFICIENCY, FRACTURES): VITD: 56.64 ng/mL (ref 30.00–100.00)

## 2016-07-14 LAB — TSH: TSH: 1.54 u[IU]/mL (ref 0.35–4.50)

## 2016-07-14 MED ORDER — AZITHROMYCIN 250 MG PO TABS: ORAL_TABLET | ORAL | 0 refills | Status: DC

## 2016-07-14 MED ORDER — TRIAMTERENE-HCTZ 37.5-25 MG PO CAPS: 1.0000 | ORAL_CAPSULE | Freq: Every day | ORAL | 2 refills | Status: DC

## 2016-07-14 NOTE — Assessment & Plan Note (Signed)
Patient encouraged to maintain heart healthy diet, regular exercise, adequate sleep. Consider daily probiotics. Take medications as prescribed. Given and reviewed copy of ACP documents from [place] Secretary of State and encouraged to complete and return 

## 2016-07-14 NOTE — Assessment & Plan Note (Signed)
Check level and continue daily supplements

## 2016-07-14 NOTE — Assessment & Plan Note (Addendum)
Well controlled, no changes to meds. Encouraged heart healthy diet such as the DASH diet and exercise as tolerated. Due to cough will try to stop the Losartan and add Dyazide and recheck in 1 month.

## 2016-07-14 NOTE — Assessment & Plan Note (Signed)
Not overwhelming and nonproductive. No other signs of illness. Present several times a week for roughly 9 months. Check CXR

## 2016-07-14 NOTE — Assessment & Plan Note (Signed)
Encouraged to get adequate exercise, calcium and vitamin d intake 

## 2016-07-14 NOTE — Progress Notes (Signed)
Pre visit review using our clinic review tool, if applicable. No additional management support is needed unless otherwise documented below in the visit note. 

## 2016-07-14 NOTE — Assessment & Plan Note (Signed)
Encouraged heart healthy diet, increase exercise, avoid trans fats, consider a krill oil cap daily 

## 2016-07-14 NOTE — Progress Notes (Signed)
Patient ID: Mandy [name], female   DOB: Jul 03, 1946, 70 y.o.   MRN: [id number]   Subjective:    Patient ID: Mandy [name], female    DOB: 1946-09-12, 70 y.o.   MRN: [id number]  Chief Complaint  Patient presents with  . Annual Exam    HPI Patient is in today for annual preventative exam and follow up on numerous medical concerns. Only new complaint is cough, it is nonproductive and without a pattern. Happens multiple times a week and has been going on for roughly 9 months. Bothers her husband more than it bothers her. She is eating a heart healthy diet and exercising regularly. Denies CP/palp/SOB/HA/congestion/fevers/GI or GU c/o. Taking meds as prescribed  Past Medical History:  Diagnosis Date  . Atrophic vaginitis 11/18/2012  . Chicken pox as a child  . Cough 07/14/2016  . Dermatitis 04/26/2011  . HTN (hypertension) 04/13/2010   Qualifier: Diagnosis of  By: Charlett Blake MD, Erline Levine    . Measles as a child  . Miscarriage 1981  . Onychomycosis 04/26/2011  . Osteopenia 10/17/1999  . Osteopenia 04/13/2010   Qualifier: Diagnosis of  By: Charlett Blake MD, Erline Levine    . Preventative health care 07/14/2016  . Screening for malignant neoplasm of the cervix 04/26/2011  . Tinea pedis 05/10/2014    Past Surgical History:  Procedure Laterality Date  . DILATION AND CURETTAGE OF UTERUS  1981   miscarriage    Family History  Problem Relation Age of Onset  . Osteoporosis Mother   . Coronary artery disease Mother   . Heart attack Mother 76  . Hypertension Mother   . Dementia Mother   . Diabetes Father     type 2  . Heart disease Father   . Stroke Father   . Dementia Maternal Grandmother   . Asthma Maternal Grandfather   . Cancer Paternal Grandmother     ovarian  . Dementia Paternal Grandmother   . Diabetes Paternal Grandfather     tyoe 2  . Hearing loss Paternal Grandfather   . Dementia Brother     Lewy Body  . Arthritis Other   . Hyperlipidemia Other   . Hypertension Other   . Other Other    Cardiovascular disorder    Social History   Social History  . Marital status: Married    Spouse name: N/A  . Number of children: N/A  . Years of education: N/A   Occupational History  . bookkeeper    Social History Main Topics  . Smoking status: Former Smoker    Packs/day: 1.00    Types: Cigarettes    Quit date: 10/16/2000  . Smokeless tobacco: Never Used  . Alcohol use No  . Drug use: No  . Sexual activity: Yes    Partners: Male   Other Topics Concern  . Not on file   Social History Narrative   Regular exercise- yes   Does Yoga 3-4 X week, personal trainer 1 X week, and walks regularly   Has lost 16 pounds by minimizing processed foods and meats    Outpatient Medications Prior to Visit  Medication Sig Dispense Refill  . aspirin 81 MG tablet Take 81 mg by mouth daily.    Marland Kitchen augmented betamethasone dipropionate (DIPROLENE-AF) 0.05 % ointment Apply topically 2 (two) times daily.    Marland Kitchen b complex vitamins tablet Take 1 tablet by mouth daily.    . calcium citrate-vitamin D (CITRACAL+D) 315-200 MG-UNIT per tablet Take 1 tablet by mouth 2 (two) times  daily.      . ketoconazole (NIZORAL) 2 % cream Apply 1 application topically daily.    . Melatonin 10 MG CAPS Take by mouth at bedtime.    . multivitamin (THERAGRAN) per tablet Take 1 tablet by mouth daily.      . mupirocin nasal ointment (BACTROBAN) 2 % Apply to nares via Qtip every night at bedtime x 10 days. 10 g 0  . Omega-3 Fatty Acids-Vitamin E (COROMEGA PO) Take by mouth.    Marland Kitchen PREMARIN vaginal cream APPLY A SMALL AMOUNT PER VAGINA 3 TIMES A WEEK AT BEDTIME 30 g 0  . Probiotic Product (PROBIOTIC DAILY PO) Take by mouth daily.    . TURMERIC PO Take by mouth daily.    . vitamin C (ASCORBIC ACID) 500 MG tablet Take 500 mg by mouth daily.    Marland Kitchen losartan (COZAAR) 50 MG tablet TAKE 1 TABLET ONCE DAILY. 90 tablet 1   No facility-administered medications prior to visit.     Allergies  Allergen Reactions  . Codeine   .  Pneumococcal Vaccine Polyvalent     REACTION: rash at injection site and induration.    Review of Systems  Constitutional: Negative for chills, fever and malaise/fatigue.  HENT: Negative for congestion and hearing loss.   Eyes: Negative for blurred vision and discharge.  Respiratory: Positive for cough. Negative for sputum production and shortness of breath.   Cardiovascular: Negative for chest pain, palpitations and leg swelling.  Gastrointestinal: Negative for abdominal pain, blood in stool, constipation, diarrhea, heartburn, nausea and vomiting.  Genitourinary: Negative for dysuria, frequency, hematuria and urgency.  Musculoskeletal: Negative for back pain, falls and myalgias.  Skin: Negative for rash.  Neurological: Negative for dizziness, sensory change, loss of consciousness, weakness and headaches.  Endo/Heme/Allergies: Negative for environmental allergies. Does not bruise/bleed easily.  Psychiatric/Behavioral: Negative for depression and suicidal ideas. The patient is not nervous/anxious and does not have insomnia.        Objective:    Physical Exam  Constitutional: She is oriented to person, place, and time. She appears well-developed and well-nourished. No distress.  HENT:  Head: Normocephalic and atraumatic.  Eyes: Conjunctivae are normal.  Neck: Neck supple. No thyromegaly present.  Cardiovascular: Normal rate, regular rhythm and normal heart sounds.   No murmur heard. Pulmonary/Chest: Effort normal and breath sounds normal. No respiratory distress.  Abdominal: Soft. Bowel sounds are normal. She exhibits no distension and no mass. There is no tenderness.  Musculoskeletal: She exhibits no edema.  Lymphadenopathy:    She has no cervical adenopathy.  Neurological: She is alert and oriented to person, place, and time.  Skin: Skin is warm and dry.  Psychiatric: She has a normal mood and affect. Her behavior is normal.    BP 108/72 (BP Location: Left Arm, Patient  Position: Sitting, Cuff Size: Normal)   Pulse 64   Temp 97.8 F (36.6 C) (Oral)   Ht 5\' 2"  (1.575 m)   Wt 153 lb 8 oz (69.6 kg)   SpO2 98%   BMI 28.08 kg/m  Wt Readings from Last 3 Encounters:  07/14/16 153 lb 8 oz (69.6 kg)  04/27/16 162 lb (73.5 kg)  02/07/16 164 lb 3.2 oz (74.5 kg)     Lab Results  Component Value Date   WBC 6.6 04/27/2016   HGB 13.6 04/27/2016   HCT 40.0 04/27/2016   PLT 310.0 04/27/2016   GLUCOSE 83 12/21/2015   CHOL 214 (H) 12/21/2015   TRIG 95.0 12/21/2015  HDL 65.90 12/21/2015   LDLDIRECT 113.4 11/12/2012   LDLCALC 129 (H) 12/21/2015   ALT 10 12/21/2015   AST 17 12/21/2015   NA 138 12/21/2015   K 4.4 12/21/2015   CL 101 12/21/2015   CREATININE 0.91 12/21/2015   BUN 17 12/21/2015   CO2 29 12/21/2015   TSH 2.59 12/21/2015    Lab Results  Component Value Date   TSH 2.59 12/21/2015   Lab Results  Component Value Date   WBC 6.6 04/27/2016   HGB 13.6 04/27/2016   HCT 40.0 04/27/2016   MCV 94.3 04/27/2016   PLT 310.0 04/27/2016   Lab Results  Component Value Date   NA 138 12/21/2015   K 4.4 12/21/2015   CO2 29 12/21/2015   GLUCOSE 83 12/21/2015   BUN 17 12/21/2015   CREATININE 0.91 12/21/2015   BILITOT 0.7 12/21/2015   ALKPHOS 45 12/21/2015   AST 17 12/21/2015   ALT 10 12/21/2015   PROT 6.7 12/21/2015   ALBUMIN 4.1 12/21/2015   CALCIUM 9.5 12/21/2015   GFR 64.78 12/21/2015   Lab Results  Component Value Date   CHOL 214 (H) 12/21/2015   Lab Results  Component Value Date   HDL 65.90 12/21/2015   Lab Results  Component Value Date   LDLCALC 129 (H) 12/21/2015   Lab Results  Component Value Date   TRIG 95.0 12/21/2015   Lab Results  Component Value Date   CHOLHDL 3 12/21/2015   No results found for: HGBA1C     Assessment & Plan:   Problem List Items Addressed This Visit    Vitamin D deficiency    Check level and continue daily supplements      Relevant Orders   VITAMIN D 25 Hydroxy (Vit-D Deficiency,  Fractures)   Mixed hyperlipidemia    Encouraged heart healthy diet, increase exercise, avoid trans fats, consider a krill oil cap daily      Relevant Medications   triamterene-hydrochlorothiazide (DYAZIDE) 37.5-25 MG capsule   Other Relevant Orders   Lipid panel   HTN (hypertension)    Well controlled, no changes to meds. Encouraged heart healthy diet such as the DASH diet and exercise as tolerated. Due to cough will try to stop the Losartan and add Dyazide and recheck in 1 month.       Relevant Medications   triamterene-hydrochlorothiazide (DYAZIDE) 37.5-25 MG capsule   Other Relevant Orders   TSH   CBC   Comprehensive metabolic panel   Preventative health care    Patient encouraged to maintain heart healthy diet, regular exercise, adequate sleep. Consider daily probiotics. Take medications as prescribed. Given and reviewed copy of ACP documents from [place] and encouraged to complete and return      Cough    Not overwhelming and nonproductive. No other signs of illness. Present several times a week for roughly 9 months. Check CXR      Relevant Orders   DG Chest 2 View    Other Visit Diagnoses    Breast cancer screening    -  Primary   Relevant Orders   MM Digital Screening      I have discontinued Ms. Amory's losartan. I am also having her start on triamterene-hydrochlorothiazide. Additionally, I am having her maintain her multivitamin, calcium citrate-vitamin D, vitamin C, b complex vitamins, aspirin, ketoconazole, augmented betamethasone dipropionate, Omega-3 Fatty Acids-Vitamin E (COROMEGA PO), mupirocin nasal ointment, Melatonin, Probiotic Product (PROBIOTIC DAILY PO), TURMERIC PO, and PREMARIN.  Meds ordered this  encounter  Medications  . triamterene-hydrochlorothiazide (DYAZIDE) 37.5-25 MG capsule    Sig: Take 1 each (1 capsule total) by mouth daily.    Dispense:  30 capsule    Refill:  2     Penni Homans, MD

## 2016-07-14 NOTE — Patient Instructions (Signed)
Preventive Care for Adults, Female A healthy lifestyle and preventive care can promote health and wellness. Preventive health guidelines for women include the following key practices.  A routine yearly physical is a good way to check with your health care provider about your health and preventive screening. It is a chance to share any concerns and updates on your health and to receive a thorough exam.  Visit your dentist for a routine exam and preventive care every 6 months. Brush your teeth twice a day and floss once a day. Good oral hygiene prevents tooth decay and gum disease.  The frequency of eye exams is based on your age, health, family medical history, use of contact lenses, and other factors. Follow your health care provider's recommendations for frequency of eye exams.  Eat a healthy diet. Foods like vegetables, fruits, whole grains, low-fat dairy products, and lean protein foods contain the nutrients you need without too many calories. Decrease your intake of foods high in solid fats, added sugars, and salt. Eat the right amount of calories for you.Get information about a proper diet from your health care provider, if necessary.  Regular physical exercise is one of the most important things you can do for your health. Most adults should get at least 150 minutes of moderate-intensity exercise (any activity that increases your heart rate and causes you to sweat) each week. In addition, most adults need muscle-strengthening exercises on 2 or more days a week.  Maintain a healthy weight. The body mass index (BMI) is a screening tool to identify possible weight problems. It provides an estimate of body fat based on height and weight. Your health care provider can find your BMI and can help you achieve or maintain a healthy weight.For adults 20 years and older:  A BMI below 18.5 is considered underweight.  A BMI of 18.5 to 24.9 is normal.  A BMI of 25 to 29.9 is considered overweight.  A  BMI of 30 and above is considered obese.  Maintain normal blood lipids and cholesterol levels by exercising and minimizing your intake of saturated fat. Eat a balanced diet with plenty of fruit and vegetables. Blood tests for lipids and cholesterol should begin at age 45 and be repeated every 5 years. If your lipid or cholesterol levels are high, you are over 50, or you are at high risk for heart disease, you may need your cholesterol levels checked more frequently.Ongoing high lipid and cholesterol levels should be treated with medicines if diet and exercise are not working.  If you smoke, find out from your health care provider how to quit. If you do not use tobacco, do not start.  Lung cancer screening is recommended for adults aged 45-80 years who are at high risk for developing lung cancer because of a history of smoking. A yearly low-dose CT scan of the lungs is recommended for people who have at least a 30-pack-year history of smoking and are a current smoker or have quit within the past 15 years. A pack year of smoking is smoking an average of 1 pack of cigarettes a day for 1 year (for example: 1 pack a day for 30 years or 2 packs a day for 15 years). Yearly screening should continue until the smoker has stopped smoking for at least 15 years. Yearly screening should be stopped for people who develop a health problem that would prevent them from having lung cancer treatment.  If you are pregnant, do not drink alcohol. If you are  breastfeeding, be very cautious about drinking alcohol. If you are not pregnant and choose to drink alcohol, do not have more than 1 drink per day. One drink is considered to be 12 ounces (355 mL) of beer, 5 ounces (148 mL) of wine, or 1.5 ounces (44 mL) of liquor.  Avoid use of street drugs. Do not share needles with anyone. Ask for help if you need support or instructions about stopping the use of drugs.  High blood pressure causes heart disease and increases the risk  of stroke. Your blood pressure should be checked at least every 1 to 2 years. Ongoing high blood pressure should be treated with medicines if weight loss and exercise do not work.  If you are 55-79 years old, ask your health care provider if you should take aspirin to prevent strokes.  Diabetes screening is done by taking a blood sample to check your blood glucose level after you have not eaten for a certain period of time (fasting). If you are not overweight and you do not have risk factors for diabetes, you should be screened once every 3 years starting at age 45. If you are overweight or obese and you are 40-70 years of age, you should be screened for diabetes every year as part of your cardiovascular risk assessment.  Breast cancer screening is essential preventive care for women. You should practice "breast self-awareness." This means understanding the normal appearance and feel of your breasts and may include breast self-examination. Any changes detected, no matter how small, should be reported to a health care provider. Women in their 20s and 30s should have a clinical breast exam (CBE) by a health care provider as part of a regular health exam every 1 to 3 years. After age 40, women should have a CBE every year. Starting at age 40, women should consider having a mammogram (breast X-ray test) every year. Women who have a family history of breast cancer should talk to their health care provider about genetic screening. Women at a high risk of breast cancer should talk to their health care providers about having an MRI and a mammogram every year.  Breast cancer gene (BRCA)-related cancer risk assessment is recommended for women who have family members with BRCA-related cancers. BRCA-related cancers include breast, ovarian, tubal, and peritoneal cancers. Having family members with these cancers may be associated with an increased risk for harmful changes (mutations) in the breast cancer genes BRCA1 and  BRCA2. Results of the assessment will determine the need for genetic counseling and BRCA1 and BRCA2 testing.  Your health care provider may recommend that you be screened regularly for cancer of the pelvic organs (ovaries, uterus, and vagina). This screening involves a pelvic examination, including checking for microscopic changes to the surface of your cervix (Pap test). You may be encouraged to have this screening done every 3 years, beginning at age 21.  For women ages 30-65, health care providers may recommend pelvic exams and Pap testing every 3 years, or they may recommend the Pap and pelvic exam, combined with testing for human papilloma virus (HPV), every 5 years. Some types of HPV increase your risk of cervical cancer. Testing for HPV may also be done on women of any age with unclear Pap test results.  Other health care providers may not recommend any screening for nonpregnant women who are considered low risk for pelvic cancer and who do not have symptoms. Ask your health care provider if a screening pelvic exam is right for   you.  If you have had past treatment for cervical cancer or a condition that could lead to cancer, you need Pap tests and screening for cancer for at least 20 years after your treatment. If Pap tests have been discontinued, your risk factors (such as having a new sexual partner) need to be reassessed to determine if screening should resume. Some women have medical problems that increase the chance of getting cervical cancer. In these cases, your health care provider may recommend more frequent screening and Pap tests.  Colorectal cancer can be detected and often prevented. Most routine colorectal cancer screening begins at the age of 50 years and continues through age 75 years. However, your health care provider may recommend screening at an earlier age if you have risk factors for colon cancer. On a yearly basis, your health care provider may provide home test kits to check  for hidden blood in the stool. Use of a small camera at the end of a tube, to directly examine the colon (sigmoidoscopy or colonoscopy), can detect the earliest forms of colorectal cancer. Talk to your health care provider about this at age 50, when routine screening begins. Direct exam of the colon should be repeated every 5-10 years through age 75 years, unless early forms of precancerous polyps or small growths are found.  People who are at an increased risk for hepatitis B should be screened for this virus. You are considered at high risk for hepatitis B if:  You were born in a country where hepatitis B occurs often. Talk with your health care provider about which countries are considered high risk.  Your parents were born in a high-risk country and you have not received a shot to protect against hepatitis B (hepatitis B vaccine).  You have HIV or AIDS.  You use needles to inject street drugs.  You live with, or have sex with, someone who has hepatitis B.  You get hemodialysis treatment.  You take certain medicines for conditions like cancer, organ transplantation, and autoimmune conditions.  Hepatitis C blood testing is recommended for all people born from 1945 through 1965 and any individual with known risks for hepatitis C.  Practice safe sex. Use condoms and avoid high-risk sexual practices to reduce the spread of sexually transmitted infections (STIs). STIs include gonorrhea, chlamydia, syphilis, trichomonas, herpes, HPV, and human immunodeficiency virus (HIV). Herpes, HIV, and HPV are viral illnesses that have no cure. They can result in disability, cancer, and death.  You should be screened for sexually transmitted illnesses (STIs) including gonorrhea and chlamydia if:  You are sexually active and are younger than 24 years.  You are older than 24 years and your health care provider tells you that you are at risk for this type of infection.  Your sexual activity has changed  since you were last screened and you are at an increased risk for chlamydia or gonorrhea. Ask your health care provider if you are at risk.  If you are at risk of being infected with HIV, it is recommended that you take a prescription medicine daily to prevent HIV infection. This is called preexposure prophylaxis (PrEP). You are considered at risk if:  You are sexually active and do not regularly use condoms or know the HIV status of your partner(s).  You take drugs by injection.  You are sexually active with a partner who has HIV.  Talk with your health care provider about whether you are at high risk of being infected with HIV. If   you choose to begin PrEP, you should first be tested for HIV. You should then be tested every 3 months for as long as you are taking PrEP.  Osteoporosis is a disease in which the bones lose minerals and strength with aging. This can result in serious bone fractures or breaks. The risk of osteoporosis can be identified using a bone density scan. Women ages 67 years and over and women at risk for fractures or osteoporosis should discuss screening with their health care providers. Ask your health care provider whether you should take a calcium supplement or vitamin D to reduce the rate of osteoporosis.  Menopause can be associated with physical symptoms and risks. Hormone replacement therapy is available to decrease symptoms and risks. You should talk to your health care provider about whether hormone replacement therapy is right for you.  Use sunscreen. Apply sunscreen liberally and repeatedly throughout the day. You should seek shade when your shadow is shorter than you. Protect yourself by wearing long sleeves, pants, a wide-brimmed hat, and sunglasses year round, whenever you are outdoors.  Once a month, do a whole body skin exam, using a mirror to look at the skin on your back. Tell your health care provider of new moles, moles that have irregular borders, moles that  are larger than a pencil eraser, or moles that have changed in shape or color.  Stay current with required vaccines (immunizations).  Influenza vaccine. All adults should be immunized every year.  Tetanus, diphtheria, and acellular pertussis (Td, Tdap) vaccine. Pregnant women should receive 1 dose of Tdap vaccine during each pregnancy. The dose should be obtained regardless of the length of time since the last dose. Immunization is preferred during the 27th-36th week of gestation. An adult who has not previously received Tdap or who does not know her vaccine status should receive 1 dose of Tdap. This initial dose should be followed by tetanus and diphtheria toxoids (Td) booster doses every 10 years. Adults with an unknown or incomplete history of completing a 3-dose immunization series with Td-containing vaccines should begin or complete a primary immunization series including a Tdap dose. Adults should receive a Td booster every 10 years.  Varicella vaccine. An adult without evidence of immunity to varicella should receive 2 doses or a second dose if she has previously received 1 dose. Pregnant females who do not have evidence of immunity should receive the first dose after pregnancy. This first dose should be obtained before leaving the health care facility. The second dose should be obtained 4-8 weeks after the first dose.  Human papillomavirus (HPV) vaccine. Females aged 13-26 years who have not received the vaccine previously should obtain the 3-dose series. The vaccine is not recommended for use in pregnant females. However, pregnancy testing is not needed before receiving a dose. If a female is found to be pregnant after receiving a dose, no treatment is needed. In that case, the remaining doses should be delayed until after the pregnancy. Immunization is recommended for any person with an immunocompromised condition through the age of 61 years if she did not get any or all doses earlier. During the  3-dose series, the second dose should be obtained 4-8 weeks after the first dose. The third dose should be obtained 24 weeks after the first dose and 16 weeks after the second dose.  Zoster vaccine. One dose is recommended for adults aged 30 years or older unless certain conditions are present.  Measles, mumps, and rubella (MMR) vaccine. Adults born  before 1957 generally are considered immune to measles and mumps. Adults born in 1957 or later should have 1 or more doses of MMR vaccine unless there is a contraindication to the vaccine or there is laboratory evidence of immunity to each of the three diseases. A routine second dose of MMR vaccine should be obtained at least 28 days after the first dose for students attending postsecondary schools, health care workers, or international travelers. People who received inactivated measles vaccine or an unknown type of measles vaccine during 1963-1967 should receive 2 doses of MMR vaccine. People who received inactivated mumps vaccine or an unknown type of mumps vaccine before 1979 and are at high risk for mumps infection should consider immunization with 2 doses of MMR vaccine. For females of childbearing age, rubella immunity should be determined. If there is no evidence of immunity, females who are not pregnant should be vaccinated. If there is no evidence of immunity, females who are pregnant should delay immunization until after pregnancy. Unvaccinated health care workers born before 1957 who lack laboratory evidence of measles, mumps, or rubella immunity or laboratory confirmation of disease should consider measles and mumps immunization with 2 doses of MMR vaccine or rubella immunization with 1 dose of MMR vaccine.  Pneumococcal 13-valent conjugate (PCV13) vaccine. When indicated, a person who is uncertain of his immunization history and has no record of immunization should receive the PCV13 vaccine. All adults 65 years of age and older should receive this  vaccine. An adult aged 19 years or older who has certain medical conditions and has not been previously immunized should receive 1 dose of PCV13 vaccine. This PCV13 should be followed with a dose of pneumococcal polysaccharide (PPSV23) vaccine. Adults who are at high risk for pneumococcal disease should obtain the PPSV23 vaccine at least 8 weeks after the dose of PCV13 vaccine. Adults older than 71 years of age who have normal immune system function should obtain the PPSV23 vaccine dose at least 1 year after the dose of PCV13 vaccine.  Pneumococcal polysaccharide (PPSV23) vaccine. When PCV13 is also indicated, PCV13 should be obtained first. All adults aged 65 years and older should be immunized. An adult younger than age 65 years who has certain medical conditions should be immunized. Any person who resides in a nursing home or long-term care facility should be immunized. An adult smoker should be immunized. People with an immunocompromised condition and certain other conditions should receive both PCV13 and PPSV23 vaccines. People with human immunodeficiency virus (HIV) infection should be immunized as soon as possible after diagnosis. Immunization during chemotherapy or radiation therapy should be avoided. Routine use of PPSV23 vaccine is not recommended for American Indians, [place] Natives, or people younger than 65 years unless there are medical conditions that require PPSV23 vaccine. When indicated, people who have unknown immunization and have no record of immunization should receive PPSV23 vaccine. One-time revaccination 5 years after the first dose of PPSV23 is recommended for people aged 19-64 years who have chronic kidney failure, nephrotic syndrome, asplenia, or immunocompromised conditions. People who received 1-2 doses of PPSV23 before age 65 years should receive another dose of PPSV23 vaccine at age 65 years or later if at least 5 years have passed since the previous dose. Doses of PPSV23 are not  needed for people immunized with PPSV23 at or after age 65 years.  Meningococcal vaccine. Adults with asplenia or persistent complement component deficiencies should receive 2 doses of quadrivalent meningococcal conjugate (MenACWY-D) vaccine. The doses should be obtained   at least 2 months apart. Microbiologists working with certain meningococcal bacteria, [place] recruits, people at risk during an outbreak, and people who travel to or live in countries with a high rate of meningitis should be immunized. A first-year college student up through age 34 years who is living in a residence hall should receive a dose if she did not receive a dose on or after her 16th birthday. Adults who have certain high-risk conditions should receive one or more doses of vaccine.  Hepatitis A vaccine. Adults who wish to be protected from this disease, have certain high-risk conditions, work with hepatitis A-infected animals, work in hepatitis A research labs, or travel to or work in countries with a high rate of hepatitis A should be immunized. Adults who were previously unvaccinated and who anticipate close contact with an international adoptee during the first 60 days after arrival in the [place] from a country with a high rate of hepatitis A should be immunized.  Hepatitis B vaccine. Adults who wish to be protected from this disease, have certain high-risk conditions, may be exposed to blood or other infectious body fluids, are household contacts or sex partners of hepatitis B positive people, are clients or workers in certain care facilities, or travel to or work in countries with a high rate of hepatitis B should be immunized.  Haemophilus influenzae type b (Hib) vaccine. A previously unvaccinated person with asplenia or sickle cell disease or having a scheduled splenectomy should receive 1 dose of Hib vaccine. Regardless of previous immunization, a recipient of a hematopoietic stem cell transplant should receive a  3-dose series 6-12 months after her successful transplant. Hib vaccine is not recommended for adults with HIV infection. Preventive Services / Frequency Ages 35 to 4 years  Blood pressure check.** / Every 3-5 years.  Lipid and cholesterol check.** / Every 5 years beginning at age 60.  Clinical breast exam.** / Every 3 years for women in their 71s and 10s.  BRCA-related cancer risk assessment.** / For women who have family members with a BRCA-related cancer (breast, ovarian, tubal, or peritoneal cancers).  Pap test.** / Every 2 years from ages 76 through 26. Every 3 years starting at age 61 through age 76 or 93 with a history of 3 consecutive normal Pap tests.  HPV screening.** / Every 3 years from ages 37 through ages 60 to 51 with a history of 3 consecutive normal Pap tests.  Hepatitis C blood test.** / For any individual with known risks for hepatitis C.  Skin self-exam. / Monthly.  Influenza vaccine. / Every year.  Tetanus, diphtheria, and acellular pertussis (Tdap, Td) vaccine.** / Consult your health care provider. Pregnant women should receive 1 dose of Tdap vaccine during each pregnancy. 1 dose of Td every 10 years.  Varicella vaccine.** / Consult your health care provider. Pregnant females who do not have evidence of immunity should receive the first dose after pregnancy.  HPV vaccine. / 3 doses over 6 months, if 93 and younger. The vaccine is not recommended for use in pregnant females. However, pregnancy testing is not needed before receiving a dose.  Measles, mumps, rubella (MMR) vaccine.** / You need at least 1 dose of MMR if you were born in 1957 or later. You may also need a 2nd dose. For females of childbearing age, rubella immunity should be determined. If there is no evidence of immunity, females who are not pregnant should be vaccinated. If there is no evidence of immunity, females who are  pregnant should delay immunization until after pregnancy.  Pneumococcal  13-valent conjugate (PCV13) vaccine.** / Consult your health care provider.  Pneumococcal polysaccharide (PPSV23) vaccine.** / 1 to 2 doses if you smoke cigarettes or if you have certain conditions.  Meningococcal vaccine.** / 1 dose if you are age 68 to 8 years and a Market researcher living in a residence hall, or have one of several medical conditions, you need to get vaccinated against meningococcal disease. You may also need additional booster doses.  Hepatitis A vaccine.** / Consult your health care provider.  Hepatitis B vaccine.** / Consult your health care provider.  Haemophilus influenzae type b (Hib) vaccine.** / Consult your health care provider. Ages 7 to 53 years  Blood pressure check.** / Every year.  Lipid and cholesterol check.** / Every 5 years beginning at age 25 years.  Lung cancer screening. / Every year if you are aged 11-80 years and have a 30-pack-year history of smoking and currently smoke or have quit within the past 15 years. Yearly screening is stopped once you have quit smoking for at least 15 years or develop a health problem that would prevent you from having lung cancer treatment.  Clinical breast exam.** / Every year after age 48 years.  BRCA-related cancer risk assessment.** / For women who have family members with a BRCA-related cancer (breast, ovarian, tubal, or peritoneal cancers).  Mammogram.** / Every year beginning at age 41 years and continuing for as long as you are in good health. Consult with your health care provider.  Pap test.** / Every 3 years starting at age 65 years through age 37 or 70 years with a history of 3 consecutive normal Pap tests.  HPV screening.** / Every 3 years from ages 72 years through ages 60 to 40 years with a history of 3 consecutive normal Pap tests.  Fecal occult blood test (FOBT) of stool. / Every year beginning at age 21 years and continuing until age 5 years. You may not need to do this test if you get  a colonoscopy every 10 years.  Flexible sigmoidoscopy or colonoscopy.** / Every 5 years for a flexible sigmoidoscopy or every 10 years for a colonoscopy beginning at age 35 years and continuing until age 48 years.  Hepatitis C blood test.** / For all people born from [birth year] through 1965 and any individual with known risks for hepatitis C.  Skin self-exam. / Monthly.  Influenza vaccine. / Every year.  Tetanus, diphtheria, and acellular pertussis (Tdap/Td) vaccine.** / Consult your health care provider. Pregnant women should receive 1 dose of Tdap vaccine during each pregnancy. 1 dose of Td every 10 years.  Varicella vaccine.** / Consult your health care provider. Pregnant females who do not have evidence of immunity should receive the first dose after pregnancy.  Zoster vaccine.** / 1 dose for adults aged 30 years or older.  Measles, mumps, rubella (MMR) vaccine.** / You need at least 1 dose of MMR if you were born in 1957 or later. You may also need a second dose. For females of childbearing age, rubella immunity should be determined. If there is no evidence of immunity, females who are not pregnant should be vaccinated. If there is no evidence of immunity, females who are pregnant should delay immunization until after pregnancy.  Pneumococcal 13-valent conjugate (PCV13) vaccine.** / Consult your health care provider.  Pneumococcal polysaccharide (PPSV23) vaccine.** / 1 to 2 doses if you smoke cigarettes or if you have certain conditions.  Meningococcal vaccine.** /  Consult your health care provider.  Hepatitis A vaccine.** / Consult your health care provider.  Hepatitis B vaccine.** / Consult your health care provider.  Haemophilus influenzae type b (Hib) vaccine.** / Consult your health care provider. Ages 64 years and over  Blood pressure check.** / Every year.  Lipid and cholesterol check.** / Every 5 years beginning at age 23 years.  Lung cancer screening. / Every year if you  are aged 16-80 years and have a 30-pack-year history of smoking and currently smoke or have quit within the past 15 years. Yearly screening is stopped once you have quit smoking for at least 15 years or develop a health problem that would prevent you from having lung cancer treatment.  Clinical breast exam.** / Every year after age 74 years.  BRCA-related cancer risk assessment.** / For women who have family members with a BRCA-related cancer (breast, ovarian, tubal, or peritoneal cancers).  Mammogram.** / Every year beginning at age 44 years and continuing for as long as you are in good health. Consult with your health care provider.  Pap test.** / Every 3 years starting at age 58 years through age 22 or 39 years with 3 consecutive normal Pap tests. Testing can be stopped between 65 and 70 years with 3 consecutive normal Pap tests and no abnormal Pap or HPV tests in the past 10 years.  HPV screening.** / Every 3 years from ages 64 years through ages 70 or 61 years with a history of 3 consecutive normal Pap tests. Testing can be stopped between 65 and 70 years with 3 consecutive normal Pap tests and no abnormal Pap or HPV tests in the past 10 years.  Fecal occult blood test (FOBT) of stool. / Every year beginning at age 40 years and continuing until age 27 years. You may not need to do this test if you get a colonoscopy every 10 years.  Flexible sigmoidoscopy or colonoscopy.** / Every 5 years for a flexible sigmoidoscopy or every 10 years for a colonoscopy beginning at age 7 years and continuing until age 32 years.  Hepatitis C blood test.** / For all people born from [birth year] through 1965 and any individual with known risks for hepatitis C.  Osteoporosis screening.** / A one-time screening for women ages 30 years and over and women at risk for fractures or osteoporosis.  Skin self-exam. / Monthly.  Influenza vaccine. / Every year.  Tetanus, diphtheria, and acellular pertussis (Tdap/Td)  vaccine.** / 1 dose of Td every 10 years.  Varicella vaccine.** / Consult your health care provider.  Zoster vaccine.** / 1 dose for adults aged 35 years or older.  Pneumococcal 13-valent conjugate (PCV13) vaccine.** / Consult your health care provider.  Pneumococcal polysaccharide (PPSV23) vaccine.** / 1 dose for all adults aged 46 years and older.  Meningococcal vaccine.** / Consult your health care provider.  Hepatitis A vaccine.** / Consult your health care provider.  Hepatitis B vaccine.** / Consult your health care provider.  Haemophilus influenzae type b (Hib) vaccine.** / Consult your health care provider. ** Family history and personal history of risk and conditions may change your health care provider's recommendations.   This information is not intended to replace advice given to you by your health care provider. Make sure you discuss any questions you have with your health care provider.   Document Released: 11/28/2001 Document Revised: 10/23/2014 Document Reviewed: 02/27/2011 Elsevier Interactive Patient Education [employer].

## 2016-07-21 ENCOUNTER — Other Ambulatory Visit: Payer: Self-pay | Admitting: Family Medicine

## 2016-08-10 ENCOUNTER — Telehealth: Payer: Self-pay | Admitting: Family Medicine

## 2016-08-10 NOTE — Telephone Encounter (Signed)
Reason for call: patient called stating she did not take BP meds and believes pneumonia caused BP to be elevated. She will do the repeat chest xray when due. Nurse visit was cancelled for BP check.

## 2016-08-16 ENCOUNTER — Other Ambulatory Visit: Payer: Medicare HMO

## 2016-08-18 DIAGNOSIS — D225 Melanocytic nevi of trunk: Secondary | ICD-10-CM | POA: Diagnosis not present

## 2016-08-18 DIAGNOSIS — D692 Other nonthrombocytopenic purpura: Secondary | ICD-10-CM | POA: Diagnosis not present

## 2016-08-18 DIAGNOSIS — L821 Other seborrheic keratosis: Secondary | ICD-10-CM | POA: Diagnosis not present

## 2016-08-18 DIAGNOSIS — D1801 Hemangioma of skin and subcutaneous tissue: Secondary | ICD-10-CM | POA: Diagnosis not present

## 2016-08-18 DIAGNOSIS — D2262 Melanocytic nevi of left upper limb, including shoulder: Secondary | ICD-10-CM | POA: Diagnosis not present

## 2016-08-18 DIAGNOSIS — D2271 Melanocytic nevi of right lower limb, including hip: Secondary | ICD-10-CM | POA: Diagnosis not present

## 2016-08-18 DIAGNOSIS — L720 Epidermal cyst: Secondary | ICD-10-CM | POA: Diagnosis not present

## 2016-08-18 DIAGNOSIS — L814 Other melanin hyperpigmentation: Secondary | ICD-10-CM | POA: Diagnosis not present

## 2016-08-18 DIAGNOSIS — Z85828 Personal history of other malignant neoplasm of skin: Secondary | ICD-10-CM | POA: Diagnosis not present

## 2016-09-13 ENCOUNTER — Ambulatory Visit (HOSPITAL_BASED_OUTPATIENT_CLINIC_OR_DEPARTMENT_OTHER)
Admission: RE | Admit: 2016-09-13 | Discharge: 2016-09-13 | Disposition: A | Discharge status: Home / Self Care | Payer: Medicare HMO | Source: Ambulatory Visit | Attending: Family Medicine | Admitting: Family Medicine

## 2016-09-13 DIAGNOSIS — J189 Pneumonia, unspecified organism: Secondary | ICD-10-CM | POA: Diagnosis not present

## 2016-09-13 DIAGNOSIS — J9811 Atelectasis: Secondary | ICD-10-CM | POA: Diagnosis not present

## 2016-09-13 DIAGNOSIS — R05 Cough: Secondary | ICD-10-CM | POA: Diagnosis not present

## 2016-09-13 DIAGNOSIS — R059 Cough, unspecified: Secondary | ICD-10-CM

## 2016-09-21 DIAGNOSIS — R69 Illness, unspecified: Secondary | ICD-10-CM | POA: Diagnosis not present

## 2016-10-29 ENCOUNTER — Other Ambulatory Visit: Payer: Self-pay | Admitting: Family Medicine

## 2016-11-06 ENCOUNTER — Other Ambulatory Visit: Payer: Self-pay | Admitting: Family Medicine

## 2016-11-15 ENCOUNTER — Encounter: Payer: Self-pay | Admitting: Family Medicine

## 2016-11-17 ENCOUNTER — Other Ambulatory Visit: Payer: Self-pay | Admitting: Family Medicine

## 2016-11-17 DIAGNOSIS — Z7289 Other problems related to lifestyle: Secondary | ICD-10-CM

## 2016-11-22 ENCOUNTER — Other Ambulatory Visit: Payer: Medicare HMO

## 2016-11-22 DIAGNOSIS — Z7289 Other problems related to lifestyle: Secondary | ICD-10-CM

## 2016-11-23 LAB — HEPATITIS C ANTIBODY: HCV Ab: NEGATIVE

## 2016-12-18 ENCOUNTER — Encounter: Payer: Self-pay | Admitting: Family Medicine

## 2016-12-18 ENCOUNTER — Ambulatory Visit (INDEPENDENT_AMBULATORY_CARE_PROVIDER_SITE_OTHER): Payer: Medicare HMO | Admitting: Family Medicine

## 2016-12-18 DIAGNOSIS — I10 Essential (primary) hypertension: Secondary | ICD-10-CM

## 2016-12-18 DIAGNOSIS — R059 Cough, unspecified: Secondary | ICD-10-CM

## 2016-12-18 DIAGNOSIS — R05 Cough: Secondary | ICD-10-CM

## 2016-12-18 MED ORDER — RANITIDINE HCL 150 MG PO TABS: 150.0000 mg | ORAL_TABLET | Freq: Every day | ORAL | 5 refills | Status: DC

## 2016-12-18 NOTE — Progress Notes (Signed)
Pre visit review using our clinic review tool, if applicable. No additional management support is needed unless otherwise documented below in the visit note. 

## 2016-12-18 NOTE — Patient Instructions (Addendum)
NOW probiotic 10 strain cap daily and avoid offending foods. Cough, Adult A cough helps to clear your throat and lungs. A cough may last only 2-3 weeks (acute), or it may last longer than 8 weeks (chronic). Many different things can cause a cough. A cough may be a sign of an illness or another medical condition. Follow these instructions at home:  Pay attention to any changes in your cough.  Take medicines only as told by your doctor.  If you were prescribed an antibiotic medicine, take it as told by your doctor. Do not stop taking it even if you start to feel better.  Talk with your doctor before you try using a cough medicine.  Drink enough fluid to keep your pee (urine) clear or pale yellow.  If the air is dry, use a cold steam vaporizer or humidifier in your home.  Stay away from things that make you cough at work or at home.  If your cough is worse at night, try using extra pillows to raise your head up higher while you sleep.  Do not smoke, and try not to be around smoke. If you need help quitting, ask your doctor.  Do not have caffeine.  Do not drink alcohol.  Rest as needed. Contact a doctor if:  You have new problems (symptoms).  You cough up yellow fluid (pus).  Your cough does not get better after 2-3 weeks, or your cough gets worse.  Medicine does not help your cough and you are not sleeping well.  You have pain that gets worse or pain that is not helped with medicine.  You have a fever.  You are losing weight and you do not know why.  You have night sweats. Get help right away if:  You cough up blood.  You have trouble breathing.  Your heartbeat is very fast. This information is not intended to replace advice given to you by your health care provider. Make sure you discuss any questions you have with your health care provider. Document Released: 06/15/2011 Document Revised: 03/09/2016 Document Reviewed: 12/09/2014 Elsevier Interactive Patient  Education  2017 [employer].

## 2016-12-18 NOTE — Progress Notes (Signed)
Patient ID: Mandy [name], female   DOB: [birth year], 71 y.o.   MRN: [id number]   Subjective:  I acted as a Education administrator for Mandy [name], [place], [place]   Patient ID: Mandy [name], female    DOB: October 07, 1946, 71 y.o.   MRN: [id number]  Chief Complaint  Patient presents with  . Cough    Cough  This is a new problem. The current episode started more than 1 month ago. The problem has been unchanged. The cough is non-productive. Pertinent negatives include no chest pain, fever, headaches, rash or shortness of breath. Her past medical history is significant for pneumonia.    Patient is in today for an acute visit for an ongoing cough. Patient states that she has concern with pneumonia. After Tx, Patient states that she is still experiencing an ongoing, nonproductive cough. Patient has no other acute concerns noted at this time. Over all feels better but cough persists. Denies CP/palp/SOB/HA/fevers/GI or GU c/o. Taking meds as prescribed  Patient Care Team: Mosie Lukes, MD as PCP - General Dayle Points, MD (General Surgery) Rolm Bookbinder, MD as Consulting Physician (Dermatology) Shon Hough, MD as Consulting Physician (Ophthalmology) Daryll Brod, MD as Consulting Physician (Orthopedic Surgery)   Past Medical History:  Diagnosis Date  . Atrophic vaginitis 11/18/2012  . Chicken pox as a child  . Cough 07/14/2016  . Dermatitis 04/26/2011  . HTN (hypertension) 04/13/2010   Qualifier: Diagnosis of  By: Charlett Blake MD, Erline Levine    . Measles as a child  . Miscarriage 1981  . Onychomycosis 04/26/2011  . Osteopenia 10/17/1999  . Osteopenia 04/13/2010   Qualifier: Diagnosis of  By: Charlett Blake MD, Erline Levine    . Preventative health care 07/14/2016  . Screening for malignant neoplasm of the cervix 04/26/2011  . Tinea pedis 05/10/2014    Past Surgical History:  Procedure Laterality Date  . DILATION AND CURETTAGE OF UTERUS  1981   miscarriage    Family History  Problem Relation Age of Onset  . Osteoporosis  Mother   . Coronary artery disease Mother   . Heart attack Mother 24  . Hypertension Mother   . Dementia Mother   . Diabetes Father     type 2  . Heart disease Father   . Stroke Father   . Dementia Maternal Grandmother   . Asthma Maternal Grandfather   . Cancer Paternal Grandmother     ovarian  . Dementia Paternal Grandmother   . Diabetes Paternal Grandfather     tyoe 2  . Hearing loss Paternal Grandfather   . Dementia Brother     Lewy Body  . Arthritis Other   . Hyperlipidemia Other   . Hypertension Other   . Other Other     Cardiovascular disorder    Social History   Social History  . Marital status: Married    Spouse name: N/A  . Number of children: N/A  . Years of education: N/A   Occupational History  . bookkeeper    Social History Main Topics  . Smoking status: Former Smoker    Packs/day: 1.00    Types: Cigarettes    Quit date: 10/16/2000  . Smokeless tobacco: Never Used  . Alcohol use No  . Drug use: No  . Sexual activity: Yes    Partners: Male   Other Topics Concern  . Not on file   Social History Narrative   Regular exercise- yes   Does Yoga 3-4 X week, personal trainer 1 X  week, and walks regularly   Has lost 16 pounds by minimizing processed foods and meats    Outpatient Medications Prior to Visit  Medication Sig Dispense Refill  . aspirin 81 MG tablet Take 81 mg by mouth daily.    Marland Kitchen augmented betamethasone dipropionate (DIPROLENE-AF) 0.05 % ointment Apply topically 2 (two) times daily.    Marland Kitchen b complex vitamins tablet Take 1 tablet by mouth daily.    . calcium citrate-vitamin D (CITRACAL+D) 315-200 MG-UNIT per tablet Take 1 tablet by mouth 2 (two) times daily.      Marland Kitchen ketoconazole (NIZORAL) 2 % cream Apply 1 application topically daily.    Marland Kitchen losartan (COZAAR) 50 MG tablet TAKE 1 TABLET ONCE DAILY. 90 tablet 0  . Melatonin 10 MG CAPS Take by mouth at bedtime.    . multivitamin (THERAGRAN) per tablet Take 1 tablet by mouth daily.      .  mupirocin nasal ointment (BACTROBAN) 2 % Apply to nares via Qtip every night at bedtime x 10 days. 10 g 0  . Omega-3 Fatty Acids-Vitamin E (COROMEGA PO) Take by mouth.    Marland Kitchen PREMARIN vaginal cream APPLY A SMALL AMOUNT PER VAGINA 3 TIMES A WEEK AT BEDTIME 30 g 0  . Probiotic Product (PROBIOTIC DAILY PO) Take by mouth daily.    . TURMERIC PO Take by mouth daily.    . vitamin C (ASCORBIC ACID) 500 MG tablet Take 500 mg by mouth daily.    Marland Kitchen azithromycin (ZITHROMAX) 250 MG tablet Take 2 by mouth once and then 1 by mouth daily for 4 days. (Patient not taking: Reported on 12/18/2016) 6 each 0  . triamterene-hydrochlorothiazide (DYAZIDE) 37.5-25 MG capsule Take 1 each (1 capsule total) by mouth daily. (Patient not taking: Reported on 12/18/2016) 30 capsule 2   No facility-administered medications prior to visit.     Allergies  Allergen Reactions  . Codeine   . Pneumococcal Vaccine Polyvalent     REACTION: rash at injection site and induration.    Review of Systems  Constitutional: Negative for fever and malaise/fatigue.  HENT: Positive for congestion.   Eyes: Negative for blurred vision.  Respiratory: Positive for cough. Negative for shortness of breath.        Patient has had a recent concern with pneumonia.  Cardiovascular: Negative for chest pain, palpitations and leg swelling.  Gastrointestinal: Negative for vomiting.  Musculoskeletal: Negative for back pain.  Skin: Negative for rash.  Neurological: Negative for loss of consciousness and headaches.       Objective:    Physical Exam  Constitutional: She is oriented to person, place, and time. She appears well-developed and well-nourished. No distress.  HENT:  Head: Normocephalic and atraumatic.  Oropharynx erythematous  Eyes: Conjunctivae are normal.  Neck: Normal range of motion. No thyromegaly present.  Cardiovascular: Normal rate and regular rhythm.   Pulmonary/Chest: Effort normal and breath sounds normal. She has no wheezes.    Abdominal: Soft. Bowel sounds are normal. There is no tenderness.  Musculoskeletal: She exhibits no edema or deformity.  Lymphadenopathy:    She has no cervical adenopathy.  Neurological: She is alert and oriented to person, place, and time.  Skin: Skin is warm and dry. She is not diaphoretic.  Psychiatric: She has a normal mood and affect.    BP 110/70 (BP Location: Left Arm, Patient Position: Sitting, Cuff Size: Normal)   Temp 97.7 F (36.5 C) (Oral)   Wt 154 lb 3.2 oz (69.9 kg)   SpO2 98%  Comment: RA  BMI 28.20 kg/m  Wt Readings from Last 3 Encounters:  12/18/16 154 lb 3.2 oz (69.9 kg)  07/14/16 153 lb 8 oz (69.6 kg)  04/27/16 162 lb (73.5 kg)     Lab Results  Component Value Date   WBC 4.9 07/14/2016   HGB 14.4 07/14/2016   HCT 42.4 07/14/2016   PLT 339.0 07/14/2016   GLUCOSE 83 07/14/2016   CHOL 203 (H) 07/14/2016   TRIG 86.0 07/14/2016   HDL 57.10 07/14/2016   LDLDIRECT 113.4 11/12/2012   LDLCALC 129 (H) 07/14/2016   ALT 10 07/14/2016   AST 18 07/14/2016   NA 137 07/14/2016   K 4.9 07/14/2016   CL 101 07/14/2016   CREATININE 0.92 07/14/2016   BUN 16 07/14/2016   CO2 33 (H) 07/14/2016   TSH 1.54 07/14/2016    Lab Results  Component Value Date   TSH 1.54 07/14/2016   Lab Results  Component Value Date   WBC 4.9 07/14/2016   HGB 14.4 07/14/2016   HCT 42.4 07/14/2016   MCV 95.3 07/14/2016   PLT 339.0 07/14/2016   Lab Results  Component Value Date   NA 137 07/14/2016   K 4.9 07/14/2016   CO2 33 (H) 07/14/2016   GLUCOSE 83 07/14/2016   BUN 16 07/14/2016   CREATININE 0.92 07/14/2016   BILITOT 0.7 07/14/2016   ALKPHOS 41 07/14/2016   AST 18 07/14/2016   ALT 10 07/14/2016   PROT 6.5 07/14/2016   ALBUMIN 3.7 07/14/2016   CALCIUM 9.3 07/14/2016   GFR 63.86 07/14/2016   Lab Results  Component Value Date   CHOL 203 (H) 07/14/2016   Lab Results  Component Value Date   HDL 57.10 07/14/2016   Lab Results  Component Value Date   LDLCALC 129  (H) 07/14/2016   Lab Results  Component Value Date   TRIG 86.0 07/14/2016   Lab Results  Component Value Date   CHOLHDL 4 07/14/2016   No results found for: HGBA1C     Assessment & Plan:   Problem List Items Addressed This Visit    HTN (hypertension)    Well controlled, no changes to meds. Encouraged heart healthy diet such as the DASH diet and exercise as tolerated.       Cough    Likely mutifactorial, does not appear infectious at this time. Likely a combination of allergies and reflux, start zyrtec and zantac and if no improvement needs to let us know so we can refer her to ENT and possibly GI for further consideration.         I have discontinued Ms. Nesbit's triamterene-hydrochlorothiazide and azithromycin. I am also having her start on ranitidine. Additionally, I am having her maintain her multivitamin, calcium citrate-vitamin D, vitamin C, b complex vitamins, aspirin, ketoconazole, augmented betamethasone dipropionate, Omega-3 Fatty Acids-Vitamin E (COROMEGA PO), mupirocin nasal ointment, Melatonin, Probiotic Product (PROBIOTIC DAILY PO), TURMERIC PO, PREMARIN, and losartan.  Meds ordered this encounter  Medications  . ranitidine (ZANTAC) 150 MG tablet    Sig: Take 1 tablet (150 mg total) by mouth at bedtime.    Dispense:  30 tablet    Refill:  5    CMA served as scribe during this visit. History, Physical and Plan performed by medical provider. Documentation and orders reviewed and attested to.  Mandy Homans, MD

## 2016-12-20 NOTE — Assessment & Plan Note (Signed)
Well controlled, no changes to meds. Encouraged heart healthy diet such as the DASH diet and exercise as tolerated.  °

## 2016-12-20 NOTE — Assessment & Plan Note (Signed)
Likely mutifactorial, does not appear infectious at this time. Likely a combination of allergies and reflux, start zyrtec and zantac and if no improvement needs to let us know so we can refer her to ENT and possibly GI for further consideration.

## 2017-01-09 ENCOUNTER — Encounter: Payer: Self-pay | Admitting: Family Medicine

## 2017-01-11 ENCOUNTER — Ambulatory Visit: Payer: Medicare HMO | Admitting: Family Medicine

## 2017-01-25 ENCOUNTER — Other Ambulatory Visit: Payer: Self-pay | Admitting: Family Medicine

## 2017-01-30 NOTE — Progress Notes (Signed)
Pre visit review using our clinic review tool, if applicable. No additional management support is needed unless otherwise documented below in the visit note. 

## 2017-01-30 NOTE — Progress Notes (Signed)
Subjective:   Mandy [name] is a 71 y.o. female who presents for Medicare Annual (Subsequent) preventive examination.  Review of Systems:  No ROS.  Medicare Wellness Visit. Cardiac Risk Factors include: advanced age (>40men, >38 women);hypertension;dyslipidemia Sleep patterns: Takes melatonin to help her get to sleep. Sleeps 5-6 hrs. Feels rested. Home Safety/Smoke Alarms:  Feels safe in home. Smoke alarms in place.  Living environment; residence and Firearm Safety: Lives with husband and dog in 2 story home. Guns safely stored Seat Belt Safety/Bike Helmet: Wears seat belt.   Counseling:   Eye Exam- Wears bifocals. Dr.Stonburner. Next appt in June. Dental-Dr.Jones every 6 months.  Female:   Pap- Last 05/07/14: NEGATIVE FOR INTRAEPITHELIAL LESIONS OR MALIGNANCY.      Mammo- Last 07/14/16: BI-RADS CATEGORY  1: Negative.      Dexa scan- Last 06/11/13: low bone mass. ORDERED TODAY     CCS- Last 07/11/12:Normal    Objective:     Vitals: BP (!) 144/88 (BP Location: Left Arm, Patient Position: Sitting, Cuff Size: Normal)   Pulse 60   Ht 5\' 2"  (1.575 m)   Wt 160 lb (72.6 kg)   SpO2 98%   BMI 29.26 kg/m   Body mass index is 29.26 kg/m.   Tobacco History  Smoking Status  . Former Smoker  . Packs/day: 1.00  . Types: Cigarettes  . Quit date: 10/16/2000  Smokeless Tobacco  . Never Used     Counseling given: No   Past Medical History:  Diagnosis Date  . Atrophic vaginitis 11/18/2012  . Chicken pox as a child  . Cough 07/14/2016  . Dermatitis 04/26/2011  . HTN (hypertension) 04/13/2010   Qualifier: Diagnosis of  By: Charlett Blake MD, Erline Levine    . Measles as a child  . Miscarriage 1981  . Onychomycosis 04/26/2011  . Osteopenia 10/17/1999  . Osteopenia 04/13/2010   Qualifier: Diagnosis of  By: Charlett Blake MD, Erline Levine    . Preventative health care 07/14/2016  . Screening for malignant neoplasm of the cervix 04/26/2011  . Tinea pedis 05/10/2014   Past Surgical History:  Procedure Laterality Date  .  DILATION AND CURETTAGE OF UTERUS  1981   miscarriage   Family History  Problem Relation Age of Onset  . Osteoporosis Mother   . Coronary artery disease Mother   . Heart attack Mother 12  . Hypertension Mother   . Dementia Mother   . Diabetes Father     type 2  . Heart disease Father   . Stroke Father   . Dementia Maternal Grandmother   . Asthma Maternal Grandfather   . Cancer Paternal Grandmother     ovarian  . Dementia Paternal Grandmother   . Diabetes Paternal Grandfather     tyoe 2  . Hearing loss Paternal Grandfather   . Dementia Brother     Lewy Body  . Arthritis Other   . Hyperlipidemia Other   . Hypertension Other   . Other Other     Cardiovascular disorder   History  Sexual Activity  . Sexual activity: Yes  . Partners: Male    Outpatient Encounter Prescriptions as of 02/01/2017  Medication Sig  . aspirin 81 MG tablet Take 81 mg by mouth daily.  Marland Kitchen augmented betamethasone dipropionate (DIPROLENE-AF) 0.05 % ointment Apply topically 2 (two) times daily.  Marland Kitchen b complex vitamins tablet Take 1 tablet by mouth daily.  . calcium citrate-vitamin D (CITRACAL+D) 315-200 MG-UNIT per tablet Take 1 tablet by mouth 2 (two) times daily.    Marland Kitchen  ketoconazole (NIZORAL) 2 % cream Apply 1 application topically daily.  Marland Kitchen losartan (COZAAR) 50 MG tablet TAKE 1 TABLET ONCE DAILY.  . Melatonin 10 MG CAPS Take by mouth at bedtime.  . multivitamin (THERAGRAN) per tablet Take 1 tablet by mouth daily.    . mupirocin nasal ointment (BACTROBAN) 2 % Apply to nares via Qtip every night at bedtime x 10 days.  . Omega-3 Fatty Acids-Vitamin E (COROMEGA PO) Take by mouth.  Marland Kitchen PREMARIN vaginal cream APPLY A SMALL AMOUNT PER VAGINA 3 TIMES A WEEK AT BEDTIME  . Probiotic Product (PROBIOTIC DAILY PO) Take by mouth daily.  . ranitidine (ZANTAC) 150 MG tablet Take 1 tablet (150 mg total) by mouth at bedtime.  . TURMERIC PO Take by mouth daily.  . vitamin C (ASCORBIC ACID) 500 MG tablet Take 500 mg by mouth  daily.   No facility-administered encounter medications on file as of 02/01/2017.     Activities of Daily Living In your present state of health, do you have any difficulty performing the following activities: 02/01/2017 02/07/2016  Hearing? N N  Vision? N N  Difficulty concentrating or making decisions? N N  Walking or climbing stairs? N N  Dressing or bathing? N N  Doing errands, shopping? N N  Preparing Food and eating ? N -  Using the Toilet? N -  In the past six months, have you accidently leaked urine? N -  Do you have problems with loss of bowel control? N -  Managing your Medications? N -  Managing your Finances? N -  Housekeeping or managing your Housekeeping? N -  Some recent data might be hidden    Patient Care Team: Mosie Lukes, MD as PCP - General Dayle Points, MD (General Surgery) Rolm Bookbinder, MD as Consulting Physician (Dermatology) Shon Hough, MD as Consulting Physician (Ophthalmology) Daryll Brod, MD as Consulting Physician (Orthopedic Surgery)    Assessment:    Physical assessment deferred to PCP.  Exercise Activities and Dietary recommendations Current Exercise Habits: Home exercise routine, Type of exercise: walking;yoga, Frequency (Times/Week): 7   Diet (meal preparation, eat out, water intake, caffeinated beverages, dairy products, fruits and vegetables): well balanced Breakfast: protein shake. Fruit. Coffee and water. Lunch: Salad. Water. Dinner:  Counsellor.water.    Goals    . Weight (lb) < 150 lb (68 kg)      Fall Risk Fall Risk  02/01/2017 02/07/2016 05/13/2015  Falls in the past year? No No No   Depression Screen PHQ 2/9 Scores 02/01/2017 02/07/2016 05/13/2015  PHQ - 2 Score 0 0 0  Exception Documentation - Patient refusal -     Cognitive Function MMSE - Mini Mental State Exam 02/01/2017  Orientation to time 5  Orientation to Place 5  Registration 3  Attention/ Calculation 5  Recall 2  Language- name 2 objects  2  Language- repeat 1  Language- follow 3 step command 3  Language- read & follow direction 1  Write a sentence 1  Copy design 1  Total score 29        Immunization History  Administered Date(s) Administered  . Hepatitis B 04/13/2008, 06/15/2008, 12/17/2008  . Influenza Split 08/03/2011, 07/31/2012  . Influenza, High Dose Seasonal PF 07/23/2013, 07/30/2015, 07/14/2016  . Influenza,inj,Quad PF,36+ Mos 07/08/2014  . Pneumococcal Conjugate-13 10/30/2013  . Pneumococcal Polysaccharide-23 12/23/2009, 12/21/2015  . Td 05/11/2010  . Zoster 09/23/2008   Screening Tests Health Maintenance  Topic Date Due  . INFLUENZA VACCINE  05/16/2017  .  MAMMOGRAM  07/14/2018  . TETANUS/TDAP  05/11/2020  . COLONOSCOPY  07/11/2022  . DEXA SCAN  Completed  . Hepatitis C Screening  Completed  . PNA vac Low Risk Adult  Completed      Plan:   Follow up with Dr.Blyth as scheduled 07/26/17.  Continue to eat heart healthy diet (full of fruits, vegetables, whole grains, lean protein, water--limit salt, fat, and sugar intake) and increase physical activity as tolerated.  Continue doing brain stimulating activities (puzzles, reading, adult coloring books, staying active) to keep memory sharp.   Bring a copy of your advance directives to your next office visit.  During the course of the visit the patient was educated and counseled about the following appropriate screening and preventive services:   Vaccines to include Pneumoccal, Influenza, Td, HCV  Cardiovascular Disease  Colorectal cancer screening  Bone density screening  Diabetes screening  Glaucoma screening  Mammography/PAP  Nutrition counseling   Patient Instructions (the written plan) was given to the patient.   Naaman Plummer [place], [place]  02/01/2017

## 2017-02-01 ENCOUNTER — Ambulatory Visit (INDEPENDENT_AMBULATORY_CARE_PROVIDER_SITE_OTHER): Payer: Medicare HMO | Admitting: *Deleted

## 2017-02-01 ENCOUNTER — Encounter: Payer: Self-pay | Admitting: *Deleted

## 2017-02-01 VITALS — BP 140/86 | HR 60 | Ht 62.0 in | Wt 160.0 lb

## 2017-02-01 DIAGNOSIS — Z0000 Encounter for general adult medical examination without abnormal findings: Secondary | ICD-10-CM

## 2017-02-01 DIAGNOSIS — Z780 Asymptomatic menopausal state: Secondary | ICD-10-CM

## 2017-02-01 NOTE — Patient Instructions (Addendum)
Follow up with Dr.Blyth as scheduled 07/26/17.  Continue to eat heart healthy diet (full of fruits, vegetables, whole grains, lean protein, water--limit salt, fat, and sugar intake) and increase physical activity as tolerated.  Continue doing brain stimulating activities (puzzles, reading, adult coloring books, staying active) to keep memory sharp.   Bring a copy of your advance directives to your next office visit.   Keep up the great work!!! Enjoy this beautiful day!!

## 2017-02-07 ENCOUNTER — Ambulatory Visit (HOSPITAL_BASED_OUTPATIENT_CLINIC_OR_DEPARTMENT_OTHER)
Admission: RE | Admit: 2017-02-07 | Discharge: 2017-02-07 | Disposition: A | Discharge status: Home / Self Care | Payer: Medicare HMO | Source: Ambulatory Visit | Attending: Family Medicine | Admitting: Family Medicine

## 2017-02-07 DIAGNOSIS — Z780 Asymptomatic menopausal state: Secondary | ICD-10-CM

## 2017-02-07 DIAGNOSIS — M8588 Other specified disorders of bone density and structure, other site: Secondary | ICD-10-CM | POA: Diagnosis not present

## 2017-02-07 DIAGNOSIS — M85852 Other specified disorders of bone density and structure, left thigh: Secondary | ICD-10-CM | POA: Diagnosis not present

## 2017-03-05 ENCOUNTER — Telehealth: Payer: Self-pay | Admitting: Family Medicine

## 2017-03-05 ENCOUNTER — Other Ambulatory Visit: Payer: Self-pay | Admitting: Family Medicine

## 2017-03-05 DIAGNOSIS — R1013 Epigastric pain: Secondary | ICD-10-CM

## 2017-03-05 MED ORDER — OMEPRAZOLE 20 MG PO CPDR
20.0000 mg | DELAYED_RELEASE_CAPSULE | ORAL | 0 refills | Status: DC
Start: 2017-03-05 — End: 2017-03-20

## 2017-03-05 NOTE — Telephone Encounter (Signed)
Spoke to the patient. Scheduled lab appt for 03/06/17 Schedule appt with PCP on 03/20/17 Sent in omeprazole to pharmacy She verbalized understanding.

## 2017-03-05 NOTE — Telephone Encounter (Signed)
Caller name:Darryl Nyra Capes Relationship to patient: Can be [phone] Pharmacy:  Reason for call:Still having trouble with acid reflux, having a lot of burps and pain. Wants to be worked in [place], please advise

## 2017-03-05 NOTE — Telephone Encounter (Signed)
She should come in for an H Pylori breath test since she has persistent dyspepsia and epigastric pain. Once done start Omeprazole 20 mg po q am and have her come in in next 1-2 weeks for further evaluation

## 2017-03-05 NOTE — Telephone Encounter (Signed)
Called the patient left message to call back. Went ahead and put in order for h pylori breath test.

## 2017-03-06 ENCOUNTER — Other Ambulatory Visit: Payer: Medicare HMO

## 2017-03-06 DIAGNOSIS — R1013 Epigastric pain: Secondary | ICD-10-CM

## 2017-03-07 LAB — H. PYLORI BREATH TEST: H. PYLORI BREATH TEST: NOT DETECTED

## 2017-03-08 ENCOUNTER — Encounter: Payer: Self-pay | Admitting: Family Medicine

## 2017-03-19 ENCOUNTER — Other Ambulatory Visit: Payer: Self-pay | Admitting: Family Medicine

## 2017-03-20 ENCOUNTER — Ambulatory Visit (INDEPENDENT_AMBULATORY_CARE_PROVIDER_SITE_OTHER): Payer: Medicare HMO | Admitting: Family Medicine

## 2017-03-20 ENCOUNTER — Encounter: Payer: Self-pay | Admitting: Family Medicine

## 2017-03-20 VITALS — BP 138/86 | HR 75 | Temp 98.2°F | Resp 18 | Wt 160.8 lb

## 2017-03-20 DIAGNOSIS — R3915 Urgency of urination: Secondary | ICD-10-CM

## 2017-03-20 DIAGNOSIS — I10 Essential (primary) hypertension: Secondary | ICD-10-CM | POA: Diagnosis not present

## 2017-03-20 DIAGNOSIS — R350 Frequency of micturition: Secondary | ICD-10-CM | POA: Diagnosis not present

## 2017-03-20 DIAGNOSIS — K219 Gastro-esophageal reflux disease without esophagitis: Secondary | ICD-10-CM | POA: Diagnosis not present

## 2017-03-20 DIAGNOSIS — M8580 Other specified disorders of bone density and structure, unspecified site: Secondary | ICD-10-CM | POA: Diagnosis not present

## 2017-03-20 DIAGNOSIS — J019 Acute sinusitis, unspecified: Secondary | ICD-10-CM

## 2017-03-20 HISTORY — DX: Frequency of micturition: R35.0

## 2017-03-20 HISTORY — DX: Gastro-esophageal reflux disease without esophagitis: K21.9

## 2017-03-20 MED ORDER — CEFDINIR 300 MG PO CAPS: 300.0000 mg | ORAL_CAPSULE | Freq: Two times a day (BID) | ORAL | 0 refills | Status: DC

## 2017-03-20 NOTE — Patient Instructions (Addendum)
drop the Omeprazole for now. Continue Ranitidine 150 mg daily for next month if no flare in heartburn can dro to QOD if no flare then use as needed.    Mucinex plain 2 times daily Elderberry twice daily Aged [employer] Garlic twice daily Vitamin C 500-1000mg  daily 64 ounces of water Probiotic "NOW" Soup  Sinusitis, Adult Sinusitis is soreness and inflammation of your sinuses. Sinuses are hollow spaces in the bones around your face. Your sinuses are located:  Around your eyes.  In the middle of your forehead.  Behind your nose.  In your cheekbones.  Your sinuses and nasal passages are lined with a stringy fluid (mucus). Mucus normally drains out of your sinuses. When your nasal tissues become inflamed or swollen, the mucus can become trapped or blocked so air cannot flow through your sinuses. This allows bacteria, viruses, and funguses to grow, which leads to infection. Sinusitis can develop quickly and last for 7?10 days (acute) or for more than 12 weeks (chronic). Sinusitis often develops after a cold. What are the causes? This condition is caused by anything that creates swelling in the sinuses or stops mucus from draining, including:  Allergies.  Asthma.  Bacterial or viral infection.  Abnormally shaped bones between the nasal passages.  Nasal growths that contain mucus (nasal polyps).  Narrow sinus openings.  Pollutants, such as chemicals or irritants in the air.  A foreign object stuck in the nose.  A fungal infection. This is rare.  What increases the risk? The following factors may make you more likely to develop this condition:  Having allergies or asthma.  Having had a recent cold or respiratory tract infection.  Having structural deformities or blockages in your nose or sinuses.  Having a weak immune system.  Doing a lot of swimming or diving.  Overusing nasal sprays.  Smoking.  What are the signs or symptoms? The main symptoms of this  condition are pain and a feeling of pressure around the affected sinuses. Other symptoms include:  Upper toothache.  Earache.  Headache.  Bad breath.  Decreased sense of smell and taste.  A cough that may get worse at night.  Fatigue.  Fever.  Thick drainage from your nose. The drainage is often green and it may contain pus (purulent).  Stuffy nose or congestion.  Postnasal drip. This is when extra mucus collects in the throat or back of the nose.  Swelling and warmth over the affected sinuses.  Sore throat.  Sensitivity to light.  How is this diagnosed? This condition is diagnosed based on symptoms, a medical history, and a physical exam. To find out if your condition is acute or chronic, your health care provider may:  Look in your nose for signs of nasal polyps.  Tap over the affected sinus to check for signs of infection.  View the inside of your sinuses using an imaging device that has a light attached (endoscope).  If your health care provider suspects that you have chronic sinusitis, you may also:  Be tested for allergies.  Have a sample of mucus taken from your nose (nasal culture) and checked for bacteria.  Have a mucus sample examined to see if your sinusitis is related to an allergy.  If your sinusitis does not respond to treatment and it lasts longer than 8 weeks, you may have an MRI or CT scan to check your sinuses. These scans also help to determine how severe your infection is. In rare cases, a bone biopsy may  be done to rule out more serious types of fungal sinus disease. How is this treated? Treatment for sinusitis depends on the cause and whether your condition is chronic or acute. If a virus is causing your sinusitis, your symptoms will go away on their own within 10 days. You may be given medicines to relieve your symptoms, including:  Topical nasal decongestants. They shrink swollen nasal passages and let mucus drain from your  sinuses.  Antihistamines. These drugs block inflammation that is triggered by allergies. This can help to ease swelling in your nose and sinuses.  Topical nasal corticosteroids. These are nasal sprays that ease inflammation and swelling in your nose and sinuses.  Nasal saline washes. These rinses can help to get rid of thick mucus in your nose.  If your condition is caused by bacteria, you will be given an antibiotic medicine. If your condition is caused by a fungus, you will be given an antifungal medicine. Surgery may be needed to correct underlying conditions, such as narrow nasal passages. Surgery may also be needed to remove polyps. Follow these instructions at home: Medicines  Take, use, or apply over-the-counter and prescription medicines only as told by your health care provider. These may include nasal sprays.  If you were prescribed an antibiotic medicine, take it as told by your health care provider. Do not stop taking the antibiotic even if you start to feel better. Hydrate and Humidify  Drink enough water to keep your urine clear or pale yellow. Staying hydrated will help to thin your mucus.  Use a cool mist humidifier to keep the humidity level in your home above 50%.  Inhale steam for 10-15 minutes, 3-4 times a day or as told by your health care provider. You can do this in the bathroom while a hot shower is running.  Limit your exposure to cool or dry air. Rest  Rest as much as possible.  Sleep with your head raised (elevated).  Make sure to get enough sleep each night. General instructions  Apply a warm, moist washcloth to your face 3-4 times a day or as told by your health care provider. This will help with discomfort.  Wash your hands often with soap and water to reduce your exposure to viruses and other germs. If soap and water are not available, use hand sanitizer.  Do not smoke. Avoid being around people who are smoking (secondhand smoke).  Keep all  follow-up visits as told by your health care provider. This is important. Contact a health care provider if:  You have a fever.  Your symptoms get worse.  Your symptoms do not improve within 10 days. Get help right away if:  You have a severe headache.  You have persistent vomiting.  You have pain or swelling around your face or eyes.  You have vision problems.  You develop confusion.  Your neck is stiff.  You have trouble breathing. This information is not intended to replace advice given to you by your health care provider. Make sure you discuss any questions you have with your health care provider. Document Released: 10/02/2005 Document Revised: 05/28/2016 Document Reviewed: 07/28/2015 Elsevier Interactive Patient Education  2017 [employer].

## 2017-03-20 NOTE — Assessment & Plan Note (Signed)
Check urinalysis and urine culture 

## 2017-03-20 NOTE — Assessment & Plan Note (Signed)
Doing much better with diet changes and less carbs. Will drop the Omeprazole for now. Continue Ranitidine 150 mg daily for next month if no flare in heartburn can dro to QOD if no flare then use as needed. Can use Ranitdine up to 300 mg as needed on a bad day. Tums prn

## 2017-03-20 NOTE — Assessment & Plan Note (Addendum)
Encouraged increased rest and hydration, add probiotics, zinc such as Coldeze or Xicam. Treat fevers as needed. Start Cefdinir and mucinex

## 2017-03-20 NOTE — Assessment & Plan Note (Signed)
Well controlled, no changes to meds. Encouraged heart healthy diet such as the DASH diet and exercise as tolerated.  °

## 2017-03-20 NOTE — Progress Notes (Signed)
Subjective:  I acted as a Education administrator for Dr. Charlett Blake. [name], [place]  Patient ID: Mandy [name], female    DOB: Oct 01, 1946, 71 y.o.   MRN: [id number]  Chief Complaint  Patient presents with  . Follow-up    HPI  Patient is in today for a follow up for her dyspepsia, epigastric pain. Today she c/o sore throat, cough, head congestion, and sneezing. She also states she wakes at night with more frequency and urgency.  She states her epigastric and stomach issues have since resolved. She changed her diet to help with the gastric pain. Stopping the Prilosec, and continue the Zantac daily for the next month, if no flare she will  take the zantac as needed. No dysuria or hematuria. Denies CP/palp/SOB/HA/congestion/fevers/GI c/o. Taking meds as prescribed  Patient Care Team: Mosie Lukes, MD as PCP - General Kathrin Penner, Orlean Bradford, MD (General Surgery) Rolm Bookbinder, MD as Consulting Physician (Dermatology) Shon Hough, MD as Consulting Physician (Ophthalmology) Daryll Brod, MD as Consulting Physician (Orthopedic Surgery)   Past Medical History:  Diagnosis Date  . Atrophic vaginitis 11/18/2012  . Chicken pox as a child  . Cough 07/14/2016  . Dermatitis 04/26/2011  . GERD (gastroesophageal reflux disease) 03/20/2017  . HTN (hypertension) 04/13/2010   Qualifier: Diagnosis of  By: Charlett Blake MD, Erline Levine    . Measles as a child  . Miscarriage 1981  . Onychomycosis 04/26/2011  . Osteopenia 10/17/1999  . Osteopenia 04/13/2010   Qualifier: Diagnosis of  By: Charlett Blake MD, Erline Levine    . Preventative health care 07/14/2016  . Screening for malignant neoplasm of the cervix 04/26/2011  . Tinea pedis 05/10/2014    Past Surgical History:  Procedure Laterality Date  . DILATION AND CURETTAGE OF UTERUS  1981   miscarriage    Family History  Problem Relation Age of Onset  . Osteoporosis Mother   . Coronary artery disease Mother   . Heart attack Mother 38  . Hypertension Mother   . Dementia Mother   . Diabetes  Father        type 2  . Heart disease Father   . Stroke Father   . Dementia Maternal Grandmother   . Asthma Maternal Grandfather   . Cancer Paternal Grandmother        ovarian  . Dementia Paternal Grandmother   . Diabetes Paternal Grandfather        tyoe 2  . Hearing loss Paternal Grandfather   . Dementia Brother        Lewy Body  . Arthritis Other   . Hyperlipidemia Other   . Hypertension Other   . Other Other        Cardiovascular disorder    Social History   Social History  . Marital status: Married    Spouse name: N/A  . Number of children: N/A  . Years of education: N/A   Occupational History  . bookkeeper    Social History Main Topics  . Smoking status: Former Smoker    Packs/day: 1.00    Types: Cigarettes    Quit date: 10/16/2000  . Smokeless tobacco: Never Used  . Alcohol use No  . Drug use: No  . Sexual activity: Yes    Partners: Male   Other Topics Concern  . Not on file   Social History Narrative   Regular exercise- yes   Does Yoga 3-4 X week, personal trainer 1 X week, and walks regularly   Has lost 16 pounds by minimizing processed  foods and meats    Outpatient Medications Prior to Visit  Medication Sig Dispense Refill  . aspirin 81 MG tablet Take 81 mg by mouth daily.    Marland Kitchen augmented betamethasone dipropionate (DIPROLENE-AF) 0.05 % ointment Apply topically 2 (two) times daily.    Marland Kitchen b complex vitamins tablet Take 1 tablet by mouth daily.    . calcium citrate-vitamin D (CITRACAL+D) 315-200 MG-UNIT per tablet Take 1 tablet by mouth 2 (two) times daily.      Marland Kitchen ketoconazole (NIZORAL) 2 % cream Apply 1 application topically daily.    Marland Kitchen losartan (COZAAR) 50 MG tablet TAKE 1 TABLET ONCE DAILY. 90 tablet 2  . Melatonin 10 MG CAPS Take by mouth at bedtime.    . multivitamin (THERAGRAN) per tablet Take 1 tablet by mouth daily.      . mupirocin nasal ointment (BACTROBAN) 2 % Apply to nares via Qtip every night at bedtime x 10 days. 10 g 0  . Omega-3 Fatty  Acids-Vitamin E (COROMEGA PO) Take by mouth.    Marland Kitchen PREMARIN vaginal cream APPLY A SMALL AMOUNT PER VAGINA 3 TIMES A WEEK AT BEDTIME 30 g 0  . Probiotic Product (PROBIOTIC DAILY PO) Take by mouth daily.    . ranitidine (ZANTAC) 150 MG tablet Take 1 tablet (150 mg total) by mouth at bedtime. 30 tablet 5  . TURMERIC PO Take by mouth daily.    . vitamin C (ASCORBIC ACID) 500 MG tablet Take 500 mg by mouth daily.    Marland Kitchen omeprazole (PRILOSEC) 20 MG capsule Take 1 capsule (20 mg total) by mouth every morning. 30 capsule 0   No facility-administered medications prior to visit.     Allergies  Allergen Reactions  . Codeine   . Pneumococcal Vaccine Polyvalent     REACTION: rash at injection site and induration.    Review of Systems  Constitutional: Positive for malaise/fatigue. Negative for fever.  HENT: Positive for congestion and sore throat.   Eyes: Negative for blurred vision.  Respiratory: Positive for cough and sputum production. Negative for shortness of breath.   Cardiovascular: Negative for chest pain, palpitations and leg swelling.  Gastrointestinal: Negative for diarrhea and vomiting.  Genitourinary: Positive for frequency and urgency.  Musculoskeletal: Negative for back pain.  Skin: Negative for rash.  Neurological: Negative for loss of consciousness and headaches.       Objective:    Physical Exam  Constitutional: She is oriented to person, place, and time. She appears well-developed and well-nourished. No distress.  HENT:  Head: Normocephalic and atraumatic.  Eyes: Conjunctivae are normal.  Neck: Normal range of motion. No thyromegaly present.  Cardiovascular: Normal rate and regular rhythm.   Pulmonary/Chest: Effort normal and breath sounds normal. She has no wheezes.  Abdominal: Soft. Bowel sounds are normal. There is no tenderness.  Musculoskeletal: Normal range of motion. She exhibits no edema or deformity.  Neurological: She is alert and oriented to person, place, and  time.  Skin: Skin is warm and dry. She is not diaphoretic.  Psychiatric: She has a normal mood and affect.    BP 138/86 (BP Location: Left Arm, Patient Position: Sitting, Cuff Size: Normal)   Pulse 75   Temp 98.2 F (36.8 C) (Oral)   Resp 18   Wt 160 lb 12.8 oz (72.9 kg)   SpO2 96%   BMI 29.41 kg/m  Wt Readings from Last 3 Encounters:  03/20/17 160 lb 12.8 oz (72.9 kg)  02/01/17 160 lb (72.6 kg)  12/18/16  154 lb 3.2 oz (69.9 kg)   BP Readings from Last 3 Encounters:  03/20/17 138/86  02/01/17 140/86  12/18/16 110/70     Immunization History  Administered Date(s) Administered  . Hepatitis B 04/13/2008, 06/15/2008, 12/17/2008  . Influenza Split 08/03/2011, 07/31/2012  . Influenza, High Dose Seasonal PF 07/23/2013, 07/30/2015, 07/14/2016  . Influenza,inj,Quad PF,36+ Mos 07/08/2014  . Pneumococcal Conjugate-13 10/30/2013  . Pneumococcal Polysaccharide-23 12/23/2009, 12/21/2015  . Td 05/11/2010  . Zoster 09/23/2008    Health Maintenance  Topic Date Due  . INFLUENZA VACCINE  05/16/2017  . MAMMOGRAM  07/14/2018  . TETANUS/TDAP  05/11/2020  . COLONOSCOPY  07/11/2022  . DEXA SCAN  Completed  . Hepatitis C Screening  Completed  . PNA vac Low Risk Adult  Completed    Lab Results  Component Value Date   WBC 4.9 07/14/2016   HGB 14.4 07/14/2016   HCT 42.4 07/14/2016   PLT 339.0 07/14/2016   GLUCOSE 83 07/14/2016   CHOL 203 (H) 07/14/2016   TRIG 86.0 07/14/2016   HDL 57.10 07/14/2016   LDLDIRECT 113.4 11/12/2012   LDLCALC 129 (H) 07/14/2016   ALT 10 07/14/2016   AST 18 07/14/2016   NA 137 07/14/2016   K 4.9 07/14/2016   CL 101 07/14/2016   CREATININE 0.92 07/14/2016   BUN 16 07/14/2016   CO2 33 (H) 07/14/2016   TSH 1.54 07/14/2016    Lab Results  Component Value Date   TSH 1.54 07/14/2016   Lab Results  Component Value Date   WBC 4.9 07/14/2016   HGB 14.4 07/14/2016   HCT 42.4 07/14/2016   MCV 95.3 07/14/2016   PLT 339.0 07/14/2016   Lab Results    Component Value Date   NA 137 07/14/2016   K 4.9 07/14/2016   CO2 33 (H) 07/14/2016   GLUCOSE 83 07/14/2016   BUN 16 07/14/2016   CREATININE 0.92 07/14/2016   BILITOT 0.7 07/14/2016   ALKPHOS 41 07/14/2016   AST 18 07/14/2016   ALT 10 07/14/2016   PROT 6.5 07/14/2016   ALBUMIN 3.7 07/14/2016   CALCIUM 9.3 07/14/2016   GFR 63.86 07/14/2016   Lab Results  Component Value Date   CHOL 203 (H) 07/14/2016   Lab Results  Component Value Date   HDL 57.10 07/14/2016   Lab Results  Component Value Date   LDLCALC 129 (H) 07/14/2016   Lab Results  Component Value Date   TRIG 86.0 07/14/2016   Lab Results  Component Value Date   CHOLHDL 4 07/14/2016   No results found for: HGBA1C       Assessment & Plan:   Problem List Items Addressed This Visit    Osteopenia    Encouraged to get adequate exercise, calcium and vitamin d intake      Sinusitis, acute    Encouraged increased rest and hydration, add probiotics, zinc such as Coldeze or Xicam. Treat fevers as needed. Start Cefdinir and mucinex      Relevant Medications   cefdinir (OMNICEF) 300 MG capsule   HTN (hypertension)    Well controlled, no changes to meds. Encouraged heart healthy diet such as the DASH diet and exercise as tolerated.       GERD (gastroesophageal reflux disease)    Doing much better with diet changes and less carbs. Will drop the Omeprazole for now. Continue Ranitidine 150 mg daily for next month if no flare in heartburn can dro to QOD if no flare then use as needed. Can  use Ranitdine up to 300 mg as needed on a bad day. Tums prn       Other Visit Diagnoses    Urinary urgency    -  Primary   Relevant Orders   Urinalysis   Urine culture   Urinary frequency       Relevant Orders   Urinalysis   Urine culture      I have discontinued Ms. Knisley's omeprazole. I am also having her start on cefdinir. Additionally, I am having her maintain her multivitamin, calcium citrate-vitamin D, vitamin  C, b complex vitamins, aspirin, ketoconazole, augmented betamethasone dipropionate, Omega-3 Fatty Acids-Vitamin E (COROMEGA PO), mupirocin nasal ointment, Melatonin, Probiotic Product (PROBIOTIC DAILY PO), TURMERIC PO, ranitidine, losartan, and PREMARIN.  Meds ordered this encounter  Medications  . cefdinir (OMNICEF) 300 MG capsule    Sig: Take 1 capsule (300 mg total) by mouth 2 (two) times daily.    Dispense:  20 capsule    Refill:  0    CMA served as scribe during this visit. History, Physical and Plan performed by medical provider. Documentation and orders reviewed and attested to.  Penni Homans, MD

## 2017-03-20 NOTE — Assessment & Plan Note (Signed)
Encouraged to get adequate exercise, calcium and vitamin d intake 

## 2017-03-21 LAB — URINALYSIS
BILIRUBIN URINE: NEGATIVE
HGB URINE DIPSTICK: NEGATIVE
KETONES UR: NEGATIVE
Leukocytes, UA: NEGATIVE
Nitrite: NEGATIVE
PH: 7.5 (ref 5.0–8.0)
Specific Gravity, Urine: 1.015 (ref 1.000–1.030)
TOTAL PROTEIN, URINE-UPE24: NEGATIVE
Urine Glucose: NEGATIVE
Urobilinogen, UA: 0.2 (ref 0.0–1.0)

## 2017-03-21 LAB — URINE CULTURE

## 2017-03-22 DIAGNOSIS — R69 Illness, unspecified: Secondary | ICD-10-CM | POA: Diagnosis not present

## 2017-03-27 ENCOUNTER — Encounter: Payer: Self-pay | Admitting: Family Medicine

## 2017-04-01 ENCOUNTER — Encounter: Payer: Self-pay | Admitting: Family Medicine

## 2017-04-13 DIAGNOSIS — H2513 Age-related nuclear cataract, bilateral: Secondary | ICD-10-CM | POA: Diagnosis not present

## 2017-04-13 DIAGNOSIS — H524 Presbyopia: Secondary | ICD-10-CM | POA: Diagnosis not present

## 2017-04-13 DIAGNOSIS — H5203 Hypermetropia, bilateral: Secondary | ICD-10-CM | POA: Diagnosis not present

## 2017-07-26 ENCOUNTER — Ambulatory Visit (INDEPENDENT_AMBULATORY_CARE_PROVIDER_SITE_OTHER): Payer: Medicare HMO | Admitting: Family Medicine

## 2017-07-26 ENCOUNTER — Encounter: Payer: Self-pay | Admitting: Family Medicine

## 2017-07-26 VITALS — BP 156/87 | HR 56 | Temp 98.3°F | Resp 18 | Ht 62.0 in | Wt 147.2 lb

## 2017-07-26 DIAGNOSIS — R05 Cough: Secondary | ICD-10-CM | POA: Diagnosis not present

## 2017-07-26 DIAGNOSIS — M8580 Other specified disorders of bone density and structure, unspecified site: Secondary | ICD-10-CM | POA: Diagnosis not present

## 2017-07-26 DIAGNOSIS — E559 Vitamin D deficiency, unspecified: Secondary | ICD-10-CM

## 2017-07-26 DIAGNOSIS — G4700 Insomnia, unspecified: Secondary | ICD-10-CM | POA: Diagnosis not present

## 2017-07-26 DIAGNOSIS — G479 Sleep disorder, unspecified: Secondary | ICD-10-CM

## 2017-07-26 DIAGNOSIS — R059 Cough, unspecified: Secondary | ICD-10-CM

## 2017-07-26 DIAGNOSIS — Z23 Encounter for immunization: Secondary | ICD-10-CM | POA: Diagnosis not present

## 2017-07-26 DIAGNOSIS — R350 Frequency of micturition: Secondary | ICD-10-CM

## 2017-07-26 DIAGNOSIS — E782 Mixed hyperlipidemia: Secondary | ICD-10-CM | POA: Diagnosis not present

## 2017-07-26 DIAGNOSIS — I10 Essential (primary) hypertension: Secondary | ICD-10-CM

## 2017-07-26 DIAGNOSIS — Z0000 Encounter for general adult medical examination without abnormal findings: Secondary | ICD-10-CM

## 2017-07-26 DIAGNOSIS — R519 Headache, unspecified: Secondary | ICD-10-CM

## 2017-07-26 DIAGNOSIS — R351 Nocturia: Secondary | ICD-10-CM | POA: Diagnosis not present

## 2017-07-26 DIAGNOSIS — R51 Headache: Secondary | ICD-10-CM

## 2017-07-26 LAB — URINALYSIS
BILIRUBIN URINE: NEGATIVE
HGB URINE DIPSTICK: NEGATIVE
Ketones, ur: NEGATIVE
LEUKOCYTES UA: NEGATIVE
NITRITE: NEGATIVE
Specific Gravity, Urine: 1.01 (ref 1.000–1.030)
TOTAL PROTEIN, URINE-UPE24: NEGATIVE
Urine Glucose: NEGATIVE
Urobilinogen, UA: 0.2 (ref 0.0–1.0)
pH: >=9 — AB (ref 5.0–8.0)

## 2017-07-26 LAB — COMPREHENSIVE METABOLIC PANEL
ALT: 10 U/L (ref 0–35)
AST: 15 U/L (ref 0–37)
Albumin: 4 g/dL (ref 3.5–5.2)
Alkaline Phosphatase: 41 U/L (ref 39–117)
BUN: 15 mg/dL (ref 6–23)
CALCIUM: 9.1 mg/dL (ref 8.4–10.5)
CHLORIDE: 100 meq/L (ref 96–112)
CO2: 33 meq/L — AB (ref 19–32)
Creatinine, Ser: 0.89 mg/dL (ref 0.40–1.20)
GFR: 66.16 mL/min (ref >60.00)
GLUCOSE: 97 mg/dL (ref 70–99)
Potassium: 4.5 mEq/L (ref 3.5–5.1)
Sodium: 136 mEq/L (ref 135–145)
Total Bilirubin: 0.8 mg/dL (ref 0.2–1.2)
Total Protein: 6.4 g/dL (ref 6.0–8.3)

## 2017-07-26 LAB — LIPID PANEL
CHOL/HDL RATIO: 4
Cholesterol: 235 mg/dL — ABNORMAL HIGH (ref 0–200)
HDL: 58.5 mg/dL (ref >39.00)
LDL Cholesterol: 162 mg/dL — ABNORMAL HIGH (ref 0–99)
NONHDL: 176.7
TRIGLYCERIDES: 76 mg/dL (ref 0.0–149.0)
VLDL: 15.2 mg/dL (ref 0.0–40.0)

## 2017-07-26 LAB — CBC
HEMATOCRIT: 42.6 % (ref 36.0–46.0)
HEMOGLOBIN: 13.9 g/dL (ref 12.0–15.0)
MCHC: 32.7 g/dL (ref 30.0–36.0)
MCV: 99.1 fl (ref 78.0–100.0)
Platelets: 329 10*3/uL (ref 150.0–400.0)
RBC: 4.3 Mil/uL (ref 3.87–5.11)
RDW: 13.3 % (ref 11.5–15.5)
WBC: 4.5 10*3/uL (ref 4.0–10.5)

## 2017-07-26 LAB — VITAMIN D 25 HYDROXY (VIT D DEFICIENCY, FRACTURES): VITD: 65.4 ng/mL (ref 30.00–100.00)

## 2017-07-26 LAB — TSH: TSH: 3.03 u[IU]/mL (ref 0.35–4.50)

## 2017-07-26 NOTE — Assessment & Plan Note (Signed)
Referred to ENT

## 2017-07-26 NOTE — Assessment & Plan Note (Signed)
Encouraged to get adequate exercise, calcium and vitamin d intake 

## 2017-07-26 NOTE — Progress Notes (Signed)
Subjective:  I acted as a Education administrator for Dr. Charlett Blake. Mandy [name], [place]  Patient ID: Mandy [name], female    DOB: 06-14-1946, 71 y.o.   MRN: [id number]  No chief complaint on file.   HPI  Patient is in today for an annual exam And follow-up on chronic medical concerns including hyperlipidemia, vitamin D deficiency and more. She feels fairly well today. She does endorse frequent awakening at night and difficulty sleeping. She endorses nocturia. Also notes some fatigue and headaches. No recent febrile illness or acute hospitalization. She is doing well with activities of daily living and tries to stay active. No recent difficulty with by mouth intake and eat a heart healthy diet. Denies CP/palp/SOB/HA/congestion/fevers/GI or c/o. Taking meds as prescribed  Patient Care Team: Mosie Lukes, MD as PCP - General Kathrin Penner, Orlean Bradford, MD (General Surgery) Rolm Bookbinder, MD as Consulting Physician (Dermatology) Shon Hough, MD as Consulting Physician (Ophthalmology) Daryll Brod, MD as Consulting Physician (Orthopedic Surgery)   Past Medical History:  Diagnosis Date  . Atrophic vaginitis 11/18/2012  . Chicken pox as a child  . Cough 07/14/2016  . Dermatitis 04/26/2011  . GERD (gastroesophageal reflux disease) 03/20/2017  . HTN (hypertension) 04/13/2010   Qualifier: Diagnosis of  By: Charlett Blake MD, Erline Levine    . Measles as a child  . Miscarriage 1981  . Onychomycosis 04/26/2011  . Osteopenia 10/17/1999  . Osteopenia 04/13/2010   Qualifier: Diagnosis of  By: Charlett Blake MD, Erline Levine    . Preventative health care 07/14/2016  . Screening for malignant neoplasm of the cervix 04/26/2011  . Tinea pedis 05/10/2014  . Urinary frequency 03/20/2017    Past Surgical History:  Procedure Laterality Date  . DILATION AND CURETTAGE OF UTERUS  1981   miscarriage    Family History  Problem Relation Age of Onset  . Osteoporosis Mother   . Coronary artery disease Mother   . Heart attack Mother 77  . Hypertension Mother   .  Dementia Mother   . Diabetes Father        type 2  . Heart disease Father   . Stroke Father   . Dementia Maternal Grandmother   . Asthma Maternal Grandfather   . Cancer Paternal Grandmother        ovarian  . Dementia Paternal Grandmother   . Diabetes Paternal Grandfather        tyoe 2  . Hearing loss Paternal Grandfather   . Dementia Brother        Lewy Body  . Arthritis Other   . Hyperlipidemia Other   . Hypertension Other   . Other Other        Cardiovascular disorder    Social History   Social History  . Marital status: Married    Spouse name: N/A  . Number of children: N/A  . Years of education: N/A   Occupational History  . bookkeeper    Social History Main Topics  . Smoking status: Former Smoker    Packs/day: 1.00    Types: Cigarettes    Quit date: 10/16/2000  . Smokeless tobacco: Never Used  . Alcohol use No  . Drug use: No  . Sexual activity: Yes    Partners: Male   Other Topics Concern  . Not on file   Social History Narrative   Regular exercise- yes   Does Yoga 3-4 X week, personal trainer 1 X week, and walks regularly   Has lost 16 pounds by minimizing processed foods and meats  Outpatient Medications Prior to Visit  Medication Sig Dispense Refill  . aspirin 81 MG tablet Take 81 mg by mouth daily.    Marland Kitchen augmented betamethasone dipropionate (DIPROLENE-AF) 0.05 % ointment Apply topically 2 (two) times daily.    Marland Kitchen b complex vitamins tablet Take 1 tablet by mouth daily.    . calcium citrate-vitamin D (CITRACAL+D) 315-200 MG-UNIT per tablet Take 1 tablet by mouth 2 (two) times daily.      Marland Kitchen losartan (COZAAR) 50 MG tablet TAKE 1 TABLET ONCE DAILY. 90 tablet 2  . Melatonin 10 MG CAPS Take by mouth at bedtime.    . multivitamin (THERAGRAN) per tablet Take 1 tablet by mouth daily.      . mupirocin nasal ointment (BACTROBAN) 2 % Apply to nares via Qtip every night at bedtime x 10 days. 10 g 0  . Omega-3 Fatty Acids-Vitamin E (COROMEGA PO) Take by mouth.     Marland Kitchen PREMARIN vaginal cream APPLY A SMALL AMOUNT PER VAGINA 3 TIMES A WEEK AT BEDTIME 30 g 0  . Probiotic Product (PROBIOTIC DAILY PO) Take by mouth daily.    . ranitidine (ZANTAC) 150 MG tablet Take 1 tablet (150 mg total) by mouth at bedtime. 30 tablet 5  . TURMERIC PO Take by mouth daily.    . vitamin C (ASCORBIC ACID) 500 MG tablet Take 500 mg by mouth daily.    . cefdinir (OMNICEF) 300 MG capsule Take 1 capsule (300 mg total) by mouth 2 (two) times daily. 20 capsule 0  . ketoconazole (NIZORAL) 2 % cream Apply 1 application topically daily.     No facility-administered medications prior to visit.     Allergies  Allergen Reactions  . Codeine   . Pneumococcal Vaccine Polyvalent     REACTION: rash at injection site and induration.    Review of Systems  Constitutional: Negative for fever and malaise/fatigue.  HENT: Negative for congestion.   Eyes: Negative for blurred vision.  Respiratory: Negative for cough and shortness of breath.   Cardiovascular: Negative for chest pain, palpitations and leg swelling.  Gastrointestinal: Negative for vomiting.  Genitourinary: Positive for frequency and urgency.  Musculoskeletal: Negative for back pain.  Skin: Negative for rash.  Neurological: Positive for headaches. Negative for loss of consciousness.  Psychiatric/Behavioral: The patient has insomnia.        Objective:    Physical Exam  Constitutional: She is oriented to person, place, and time. She appears well-developed and well-nourished. No distress.  HENT:  Head: Normocephalic and atraumatic.  Eyes: Conjunctivae are normal.  Neck: Normal range of motion. No thyromegaly present.  Cardiovascular: Normal rate and regular rhythm.   Pulmonary/Chest: Effort normal and breath sounds normal. She has no wheezes.  Abdominal: Soft. Bowel sounds are normal. There is no tenderness.  Musculoskeletal: Normal range of motion. She exhibits no edema or deformity.  Neurological: She is alert and  oriented to person, place, and time.  Skin: Skin is warm and dry. She is not diaphoretic.  Psychiatric: She has a normal mood and affect.    BP (!) 156/87 (BP Location: Left Arm, Patient Position: Sitting, Cuff Size: Normal)   Pulse (!) 56   Temp 98.3 F (36.8 C) (Oral)   Resp 18   Ht 5\' 2"  (1.575 m)   Wt 147 lb 3.2 oz (66.8 kg)   SpO2 100%   BMI 26.92 kg/m  Wt Readings from Last 3 Encounters:  07/26/17 147 lb 3.2 oz (66.8 kg)  03/20/17 160 lb 12.8  oz (72.9 kg)  02/01/17 160 lb (72.6 kg)   BP Readings from Last 3 Encounters:  07/26/17 (!) 156/87  03/20/17 138/86  02/01/17 140/86     Immunization History  Administered Date(s) Administered  . Hepatitis B 04/13/2008, 06/15/2008, 12/17/2008  . Influenza Split 08/03/2011, 07/31/2012  . Influenza, High Dose Seasonal PF 07/23/2013, 07/30/2015, 07/14/2016, 07/26/2017  . Influenza,inj,Quad PF,6+ Mos 07/08/2014  . Pneumococcal Conjugate-13 10/30/2013  . Pneumococcal Polysaccharide-23 12/23/2009, 12/21/2015  . Td 05/11/2010  . Zoster 09/23/2008    Health Maintenance  Topic Date Due  . MAMMOGRAM  07/14/2018  . TETANUS/TDAP  05/11/2020  . COLONOSCOPY  07/11/2022  . INFLUENZA VACCINE  Completed  . DEXA SCAN  Completed  . Hepatitis C Screening  Completed  . PNA vac Low Risk Adult  Completed    Lab Results  Component Value Date   WBC 4.5 07/26/2017   HGB 13.9 07/26/2017   HCT 42.6 07/26/2017   PLT 329.0 07/26/2017   GLUCOSE 97 07/26/2017   CHOL 235 (H) 07/26/2017   TRIG 76.0 07/26/2017   HDL 58.50 07/26/2017   LDLDIRECT 113.4 11/12/2012   LDLCALC 162 (H) 07/26/2017   ALT 10 07/26/2017   AST 15 07/26/2017   NA 136 07/26/2017   K 4.5 07/26/2017   CL 100 07/26/2017   CREATININE 0.89 07/26/2017   BUN 15 07/26/2017   CO2 33 (H) 07/26/2017   TSH 3.03 07/26/2017    Lab Results  Component Value Date   TSH 3.03 07/26/2017   Lab Results  Component Value Date   WBC 4.5 07/26/2017   HGB 13.9 07/26/2017   HCT 42.6  07/26/2017   MCV 99.1 07/26/2017   PLT 329.0 07/26/2017   Lab Results  Component Value Date   NA 136 07/26/2017   K 4.5 07/26/2017   CO2 33 (H) 07/26/2017   GLUCOSE 97 07/26/2017   BUN 15 07/26/2017   CREATININE 0.89 07/26/2017   BILITOT 0.8 07/26/2017   ALKPHOS 41 07/26/2017   AST 15 07/26/2017   ALT 10 07/26/2017   PROT 6.4 07/26/2017   ALBUMIN 4.0 07/26/2017   CALCIUM 9.1 07/26/2017   GFR 66.16 07/26/2017   Lab Results  Component Value Date   CHOL 235 (H) 07/26/2017   Lab Results  Component Value Date   HDL 58.50 07/26/2017   Lab Results  Component Value Date   LDLCALC 162 (H) 07/26/2017   Lab Results  Component Value Date   TRIG 76.0 07/26/2017   Lab Results  Component Value Date   CHOLHDL 4 07/26/2017   No results found for: HGBA1C       Assessment & Plan:   Problem List Items Addressed This Visit    Vitamin D deficiency    Take a daily supplement, takes 2000 IU daily, check level      Relevant Orders   VITAMIN D 25 Hydroxy (Vit-D Deficiency, Fractures) (Completed)   Mixed hyperlipidemia    encouraged heart healthy diet, avoid trans fats, minimize simple carbs and saturated fats. Increase exercise as tolerated      Relevant Orders   Lipid panel (Completed)   Osteopenia    Encouraged to get adequate exercise, calcium and vitamin d intake      HTN (hypertension)    Well controlled, no changes to meds. Encouraged heart healthy diet such as the DASH diet and exercise as tolerated.       Relevant Orders   CBC (Completed)   Comprehensive metabolic panel (Completed)  TSH (Completed)   Preventative health care    Patient encouraged to maintain heart healthy diet, regular exercise, adequate sleep. Consider daily probiotics. Take medications as prescribed. Labs reviewed      Cough    Referred to ENT      Relevant Orders   Ambulatory referral to ENT   Urinary frequency    Check urinalysis      Frequent nocturnal awakening    With  headache and nocturia. Will refer to pulmonology for consideration of sleep study      Relevant Orders   Ambulatory referral to Pulmonology    Other Visit Diagnoses    Needs flu shot    -  Primary   Relevant Orders   Flu vaccine HIGH DOSE PF (Fluzone High dose) (Completed)   Restless sleeper       Relevant Orders   Ambulatory referral to Pulmonology   Nonintractable headache, unspecified chronicity pattern, unspecified headache type       Relevant Orders   Ambulatory referral to Pulmonology   Nocturia       Relevant Orders   Ambulatory referral to Pulmonology   Urine Culture (Completed)   Urinalysis (Completed)      I have discontinued Ms. Cauthon's ketoconazole and cefdinir. I am also having her maintain her multivitamin, calcium citrate-vitamin D, vitamin C, b complex vitamins, aspirin, augmented betamethasone dipropionate, Omega-3 Fatty Acids-Vitamin E (COROMEGA PO), mupirocin nasal ointment, Melatonin, Probiotic Product (PROBIOTIC DAILY PO), TURMERIC PO, ranitidine, losartan, and PREMARIN.  No orders of the defined types were placed in this encounter.   CMA served as Education administrator during this visit. History, Physical and Plan performed by medical provider. Documentation and orders reviewed and attested to.  Penni Homans, MD

## 2017-07-26 NOTE — Assessment & Plan Note (Signed)
encouraged heart healthy diet, avoid trans fats, minimize simple carbs and saturated fats. Increase exercise as tolerated 

## 2017-07-26 NOTE — Assessment & Plan Note (Addendum)
Take a daily supplement, takes 2000 IU daily, check level

## 2017-07-26 NOTE — Assessment & Plan Note (Signed)
Well controlled, no changes to meds. Encouraged heart healthy diet such as the DASH diet and exercise as tolerated.  °

## 2017-07-26 NOTE — Patient Instructions (Addendum)
Kegel's sets of 10 twice a day. Urology vs Medication  Atrantil for the gut Shingrix is the shingles shot, 2 shots over 6 months at [place] 72 Years and Older, Female Preventive care refers to lifestyle choices and visits with your health care provider that can promote health and wellness. What does preventive care include?  A yearly physical exam. This is also called an annual well check.  Dental exams once or twice a year.  Routine eye exams. Ask your health care provider how often you should have your eyes checked.  Personal lifestyle choices, including: ? Daily care of your teeth and gums. ? Regular physical activity. ? Eating a healthy diet. ? Avoiding tobacco and drug use. ? Limiting alcohol use. ? Practicing safe sex. ? Taking low-dose aspirin every day. ? Taking vitamin and mineral supplements as recommended by your health care provider. What happens during an annual well check? The services and screenings done by your health care provider during your annual well check will depend on your age, overall health, lifestyle risk factors, and family history of disease. Counseling Your health care provider may ask you questions about your:  Alcohol use.  Tobacco use.  Drug use.  Emotional well-being.  Home and relationship well-being.  Sexual activity.  Eating habits.  History of falls.  Memory and ability to understand (cognition).  Work and work Statistician.  Reproductive health.  Screening You may have the following tests or measurements:  Height, weight, and BMI.  Blood pressure.  Lipid and cholesterol levels. These may be checked every 5 years, or more frequently if you are over 72 years old.  Skin check.  Lung cancer screening. You may have this screening every year starting at age 72 if you have a 30-pack-year history of smoking and currently smoke or have quit within the past 15 years.  Fecal occult blood test (FOBT) of the  stool. You may have this test every year starting at age 72.  Flexible sigmoidoscopy or colonoscopy. You may have a sigmoidoscopy every 5 years or a colonoscopy every 10 years starting at age 72.  Hepatitis C blood test.  Hepatitis B blood test.  Sexually transmitted disease (STD) testing.  Diabetes screening. This is done by checking your blood sugar (glucose) after you have not eaten for a while (fasting). You may have this done every 1-3 years.  Bone density scan. This is done to screen for osteoporosis. You may have this done starting at age 22.  Mammogram. This may be done every 1-2 years. Talk to your health care provider about how often you should have regular mammograms.  Talk with your health care provider about your test results, treatment options, and if necessary, the need for more tests. Vaccines Your health care provider may recommend certain vaccines, such as:  Influenza vaccine. This is recommended every year.  Tetanus, diphtheria, and acellular pertussis (Tdap, Td) vaccine. You may need a Td booster every 10 years.  Varicella vaccine. You may need this if you have not been vaccinated.  Zoster vaccine. You may need this after age 72.  Measles, mumps, and rubella (MMR) vaccine. You may need at least one dose of MMR if you were born in 1957 or later. You may also need a second dose.  Pneumococcal 13-valent conjugate (PCV13) vaccine. One dose is recommended after age 72.  Pneumococcal polysaccharide (PPSV23) vaccine. One dose is recommended after age 72.  Meningococcal vaccine. You may need this if you have certain conditions.  Hepatitis A vaccine. You may need this if you have certain conditions or if you travel or work in places where you may be exposed to hepatitis A.  Hepatitis B vaccine. You may need this if you have certain conditions or if you travel or work in places where you may be exposed to hepatitis B.  Haemophilus influenzae type b (Hib) vaccine. You  may need this if you have certain conditions.  Talk to your health care provider about which screenings and vaccines you need and how often you need them. This information is not intended to replace advice given to you by your health care provider. Make sure you discuss any questions you have with your health care provider. Document Released: 10/29/2015 Document Revised: 06/21/2016 Document Reviewed: 08/03/2015 Elsevier Interactive Patient Education  2017 [place]

## 2017-07-26 NOTE — Assessment & Plan Note (Signed)
Check urinalysis. 

## 2017-07-27 DIAGNOSIS — G4700 Insomnia, unspecified: Secondary | ICD-10-CM | POA: Insufficient documentation

## 2017-07-27 LAB — URINE CULTURE
MICRO NUMBER:: 81134711
SPECIMEN QUALITY:: ADEQUATE

## 2017-07-27 NOTE — Assessment & Plan Note (Signed)
With headache and nocturia. Will refer to pulmonology for consideration of sleep study

## 2017-07-27 NOTE — Assessment & Plan Note (Signed)
Patient encouraged to maintain heart healthy diet, regular exercise, adequate sleep. Consider daily probiotics. Take medications as prescribed. Labs reviewed 

## 2017-08-07 ENCOUNTER — Encounter: Payer: Self-pay | Admitting: Family Medicine

## 2017-08-07 DIAGNOSIS — H9113 Presbycusis, bilateral: Secondary | ICD-10-CM | POA: Diagnosis not present

## 2017-08-07 DIAGNOSIS — R05 Cough: Secondary | ICD-10-CM | POA: Diagnosis not present

## 2017-08-07 DIAGNOSIS — K219 Gastro-esophageal reflux disease without esophagitis: Secondary | ICD-10-CM | POA: Diagnosis not present

## 2017-08-13 ENCOUNTER — Telehealth: Payer: Self-pay | Admitting: Family Medicine

## 2017-08-13 NOTE — Telephone Encounter (Signed)
Patient called to schedule bp check & lab visits, pt states that she got notification for an annual mammogram. Pt states per Dr. Amedeo Plenty not needed annually. Please confirm with patient if she needs to have one this year. Last mammo was Sept 2017.   Thanks

## 2017-08-14 NOTE — Telephone Encounter (Signed)
I would recommend MGM every 18 months or so if her insurance will cover. They might only pay every 24 months, she should check with payor. The guidelines waffle from 1 to 2 years so first check with payor and then decide between 42 and 24 months

## 2017-08-14 NOTE — Telephone Encounter (Signed)
Dr Charlett Blake-- please see below and advise about frequency of mammogram for pt?  See 08/07/17 pt email regarding nurse and lab visit.

## 2017-08-29 ENCOUNTER — Other Ambulatory Visit (INDEPENDENT_AMBULATORY_CARE_PROVIDER_SITE_OTHER): Payer: Medicare HMO

## 2017-08-29 ENCOUNTER — Ambulatory Visit (INDEPENDENT_AMBULATORY_CARE_PROVIDER_SITE_OTHER): Payer: Medicare HMO | Admitting: Medical

## 2017-08-29 ENCOUNTER — Other Ambulatory Visit: Payer: Self-pay | Admitting: Family Medicine

## 2017-08-29 VITALS — BP 130/86 | HR 56

## 2017-08-29 DIAGNOSIS — I10 Essential (primary) hypertension: Secondary | ICD-10-CM

## 2017-08-29 LAB — COMPREHENSIVE METABOLIC PANEL
ALBUMIN: 3.8 g/dL (ref 3.5–5.2)
ALT: 10 U/L (ref 0–35)
AST: 19 U/L (ref 0–37)
Alkaline Phosphatase: 37 U/L — ABNORMAL LOW (ref 39–117)
BUN: 18 mg/dL (ref 6–23)
CHLORIDE: 94 meq/L — AB (ref 96–112)
CO2: 32 mEq/L (ref 19–32)
Calcium: 9.4 mg/dL (ref 8.4–10.5)
Creatinine, Ser: 0.9 mg/dL (ref 0.40–1.20)
GFR: 65.29 mL/min (ref >60.00)
Glucose, Bld: 83 mg/dL (ref 70–99)
POTASSIUM: 4.6 meq/L (ref 3.5–5.1)
SODIUM: 131 meq/L — AB (ref 135–145)
Total Bilirubin: 0.8 mg/dL (ref 0.2–1.2)
Total Protein: 6.2 g/dL (ref 6.0–8.3)

## 2017-08-29 NOTE — Progress Notes (Addendum)
Pre visit review using our clinic tool,if applicable. No additional management support is needed unless otherwise documented below in the visit note.   Patient in for BP check per order from Dr. Charlett Blake dated 07/26/17.  Patient restarted her Losartan 50 mg daily. Voices no complaints this visit.  BP on last visit = 156/87 P= 56  BP today = 130/86 P= 56  Per E. Saguier DOD patient to continue taking BP medication as ordered and check BP's at home. Report any elevated readings. Patient agreed. Patient has Wellness check scheduled in April.   This was the recommendation I gave on day of visit.  Saguier, Percell Miller, PA-C

## 2017-08-30 ENCOUNTER — Telehealth: Payer: Self-pay | Admitting: Family Medicine

## 2017-08-30 NOTE — Telephone Encounter (Signed)
Pt called and requesting (DYAZIDE)  D/C on 12/18/16  Please advise

## 2017-08-30 NOTE — Telephone Encounter (Signed)
OK to refill the Maxzide 30 day wupply with 5 rf or 90 day supply with 1 rf at patient discretion

## 2017-08-30 NOTE — Telephone Encounter (Signed)
Relation to LE:ZVGJ Call back [phone] Pharmacy: [facility], [place] [phone] (Phone) [phone] (Fax)    Reason for call:  Patient requesting triamterene-hydrochlorothiazide (DYAZIDE) 37.5-25 MG capsule stating it doesn't reflect as current medication.  Patient states PCP prescribed another medication BP medication  (name unknown) patient states due to the newer BP medication prescribed PCP d/c due to side effects patient was experiencing and informed patient to resume triamterene-hydrochlorothiazide (DYAZIDE) 37.5-25 MG capsule, patient requesting Rx

## 2017-08-31 MED ORDER — TRIAMTERENE-HCTZ 37.5-25 MG PO CAPS: ORAL_CAPSULE | ORAL | 0 refills | Status: DC

## 2017-08-31 NOTE — Addendum Note (Signed)
Addended by: Magdalene Molly A on: 08/31/2017 09:20 AM   Modules accepted: Orders

## 2017-09-14 ENCOUNTER — Institutional Professional Consult (permissible substitution): Payer: Medicare HMO | Admitting: Pulmonary Disease

## 2017-09-14 ENCOUNTER — Other Ambulatory Visit: Payer: Self-pay | Admitting: Family Medicine

## 2017-09-26 ENCOUNTER — Ambulatory Visit: Payer: Medicare HMO | Admitting: Pulmonary Disease

## 2017-09-26 ENCOUNTER — Encounter: Payer: Self-pay | Admitting: Pulmonary Disease

## 2017-09-26 VITALS — BP 126/80 | HR 66 | Ht 62.0 in | Wt 152.0 lb

## 2017-09-26 DIAGNOSIS — R29818 Other symptoms and signs involving the nervous system: Secondary | ICD-10-CM

## 2017-09-26 NOTE — Progress Notes (Signed)
[facility]  Chief Complaint  Patient presents with  . sleep consult    Pt referred by Dr. Vivien Rossetti MD. Pt gets up 5-7 times each night, has very hard time going back to bed.     History of Present Illness: Mandy [name] is a 71 y.o. female with sleep troubles.  She has noticed more trouble with her sleep over the past 6 months.  She goes to bed at 930 pm.  She takes melatonin before going to bed.  She reads in bed, and then falls asleep after 30 minutes.  She sleeps for about 2 hours and then wakes up.  She then feels like she needs to use the bathroom.  She then has trouble falling back to sleep.  This pattern continues through out the night.  She has trouble sleeping on her back.  She doesn't recall dreaming much.  She gets out of bed between 330 and 7 am.  She feels okay in the morning.  She notices falling asleep more often in the evening while watching TV.  She isn't sure about snoring.  Her husband sleeps in a separate room.  She was told before that she grinds her teeth.  She denies sleep walking, sleep talking, bruxism, or nightmares.  There is no history of restless legs.  She denies sleep hallucinations, sleep paralysis, or cataplexy.  The Epworth score is 4 out of 24.  Past Medical History: She  has a past medical history of Atrophic vaginitis (11/18/2012), Chicken pox (as a child), Cough (07/14/2016), Dermatitis (04/26/2011), GERD (gastroesophageal reflux disease) (03/20/2017), HTN (hypertension) (04/13/2010), Measles (as a child), Miscarriage (1981), Onychomycosis (04/26/2011), Osteopenia (10/17/1999), Osteopenia (04/13/2010), Preventative health care (07/14/2016), Screening for malignant neoplasm of the cervix (04/26/2011), Tinea pedis (05/10/2014), and Urinary frequency (03/20/2017).  Vital Signs: BP 126/80 (BP Location: Left Arm, Cuff Size: Normal)   Pulse 66   Ht 5\' 2"  (1.575 m)   Wt 152 lb (68.9 kg)   SpO2 97%   BMI 27.80 kg/m   Physical  Exam:  General - pleasant Eyes - pupils reactive, wears glasses ENT - no sinus tenderness, no oral exudate, no LAN, MP 4, scalloped tongue Cardiac - regular, no murmur Chest - no wheeze, rales Abd - soft, non tender Ext - no edema Skin - no rashes Neuro - normal strength Psych - normal mood  Discussion: She has sleep disruption, and daytime sleepiness.  This is associated with nocturia.  She also has a history of hypertension on several medications.  She could have sleep apnea.  We discussed how sleep apnea can affect various health problems, including risks for hypertension, cardiovascular disease, and diabetes.  We also discussed how sleep disruption can increase risks for accidents, such as while driving.  Weight loss as a means of improving sleep apnea was also reviewed.  Additional treatment options discussed were CPAP therapy, oral appliance, and surgical intervention.  Assessment/Plan:  Suspected sleep apnea. - will arrange for home sleep study to further assess  Patient Instructions  Will arrange for home sleep study Will call to arrange for follow up after sleep study reviewed    Chesley Mires, MD [place] 09/26/2017, 4:05 PM Pager:  [phone]  Flow Sheet  Pulmonary tests:  Sleep tests:  Cardiac tests:  Events:  Review of Systems: Constitutional: Negative for fever and unexpected weight change.  HENT: Positive for nosebleeds and postnasal drip. Negative for congestion, dental problem, ear pain, rhinorrhea, sinus pressure, sneezing,  sore throat and trouble swallowing.   Eyes: Negative for redness and itching.  Respiratory: Positive for cough. Negative for chest tightness, shortness of breath and wheezing.   Cardiovascular: Negative for palpitations and leg swelling.  Gastrointestinal: Negative for nausea and vomiting.  Genitourinary: Negative for dysuria.  Musculoskeletal: Negative for joint swelling.  Skin: Negative for rash.    Allergic/Immunologic: Negative.  Negative for environmental allergies, food allergies and immunocompromised state.  Neurological: Negative for headaches.  Hematological: Bruises/bleeds easily.  Psychiatric/Behavioral: Negative for dysphoric mood. The patient is not nervous/anxious.    Past Surgical History: She  has a past surgical history that includes Dilation and curettage of uterus (1981).  Family History: Her family history includes Arthritis in her other; Asthma in her maternal grandfather; Cancer in her paternal grandmother; Coronary artery disease in her mother; Dementia in her brother, maternal grandmother, mother, and paternal grandmother; Diabetes in her father and paternal grandfather; Hearing loss in her paternal grandfather; Heart attack (age of onset: 51) in her mother; Heart disease in her father; Hyperlipidemia in her other; Hypertension in her mother and other; Osteoporosis in her mother; Other in her other; Stroke in her father.  Social History: She  reports that she quit smoking about 16 years ago. Her smoking use included cigarettes. She has a 40.00 pack-year smoking history. she has never used smokeless tobacco. She reports that she does not drink alcohol or use drugs.  Medications: Allergies as of 09/26/2017      Reactions   Codeine    Pneumococcal Vaccine Polyvalent    REACTION: rash at injection site and induration.      Medication List        Accurate as of 09/26/17  4:05 PM. Always use your most recent med list.          aspirin 81 MG tablet Take 81 mg by mouth daily.   augmented betamethasone dipropionate 0.05 % ointment Commonly known as:  DIPROLENE-AF Apply topically 2 (two) times daily.   b complex vitamins tablet Take 1 tablet by mouth daily.   calcium citrate-vitamin D 315-200 MG-UNIT tablet Commonly known as:  CITRACAL+D Take 1 tablet by mouth 2 (two) times daily.   COROMEGA PO Take by mouth.   Melatonin 10 MG Caps Take by mouth at  bedtime.   multivitamin per tablet Take 1 tablet by mouth daily.   mupirocin nasal ointment 2 % Commonly known as:  BACTROBAN Apply to nares via Qtip every night at bedtime x 10 days.   PREMARIN vaginal cream Generic drug:  conjugated estrogens APPLY A SMALL AMOUNT PER VAGINA 3 TIMES A WEEK AT BEDTIME   PROBIOTIC DAILY PO Take by mouth daily.   triamterene-hydrochlorothiazide 37.5-25 MG capsule Commonly known as:  DYAZIDE TAKE (1) CAPSULE DAILY.   TURMERIC PO Take by mouth daily.   vitamin C 500 MG tablet Commonly known as:  ASCORBIC ACID Take 500 mg by mouth daily.

## 2017-09-26 NOTE — Patient Instructions (Signed)
Will arrange for home sleep study Will call to arrange for follow up after sleep study reviewed  

## 2017-09-26 NOTE — Progress Notes (Signed)
   Subjective:    Patient ID: [name], female    DOB: [birth year], 71 y.o.   MRN: [id number]  HPI    Review of Systems  Constitutional: Negative for fever and unexpected weight change.  HENT: Positive for nosebleeds and postnasal drip. Negative for congestion, dental problem, ear pain, rhinorrhea, sinus pressure, sneezing, sore throat and trouble swallowing.   Eyes: Negative for redness and itching.  Respiratory: Positive for cough. Negative for chest tightness, shortness of breath and wheezing.   Cardiovascular: Negative for palpitations and leg swelling.  Gastrointestinal: Negative for nausea and vomiting.  Genitourinary: Negative for dysuria.  Musculoskeletal: Negative for joint swelling.  Skin: Negative for rash.  Allergic/Immunologic: Negative.  Negative for environmental allergies, food allergies and immunocompromised state.  Neurological: Negative for headaches.  Hematological: Bruises/bleeds easily.  Psychiatric/Behavioral: Negative for dysphoric mood. The patient is not nervous/anxious.        Objective:   Physical Exam        Assessment & Plan:

## 2017-10-01 ENCOUNTER — Other Ambulatory Visit: Payer: Self-pay | Admitting: Family Medicine

## 2017-10-04 DIAGNOSIS — R69 Illness, unspecified: Secondary | ICD-10-CM | POA: Diagnosis not present

## 2017-10-24 DIAGNOSIS — Z85828 Personal history of other malignant neoplasm of skin: Secondary | ICD-10-CM | POA: Diagnosis not present

## 2017-10-24 DIAGNOSIS — L821 Other seborrheic keratosis: Secondary | ICD-10-CM | POA: Diagnosis not present

## 2017-10-24 DIAGNOSIS — B351 Tinea unguium: Secondary | ICD-10-CM | POA: Diagnosis not present

## 2017-10-24 DIAGNOSIS — D1801 Hemangioma of skin and subcutaneous tissue: Secondary | ICD-10-CM | POA: Diagnosis not present

## 2017-10-24 DIAGNOSIS — L570 Actinic keratosis: Secondary | ICD-10-CM | POA: Diagnosis not present

## 2017-10-24 DIAGNOSIS — D485 Neoplasm of uncertain behavior of skin: Secondary | ICD-10-CM | POA: Diagnosis not present

## 2017-10-26 ENCOUNTER — Other Ambulatory Visit: Payer: Self-pay | Admitting: Family Medicine

## 2017-10-26 DIAGNOSIS — G4733 Obstructive sleep apnea (adult) (pediatric): Secondary | ICD-10-CM | POA: Diagnosis not present

## 2017-10-30 ENCOUNTER — Telehealth: Payer: Self-pay | Admitting: Pulmonary Disease

## 2017-10-30 ENCOUNTER — Encounter: Payer: Self-pay | Admitting: Pulmonary Disease

## 2017-10-30 ENCOUNTER — Other Ambulatory Visit: Payer: Self-pay | Admitting: Family Medicine

## 2017-10-30 ENCOUNTER — Encounter: Payer: Self-pay | Admitting: Family Medicine

## 2017-10-30 DIAGNOSIS — G4733 Obstructive sleep apnea (adult) (pediatric): Secondary | ICD-10-CM | POA: Diagnosis not present

## 2017-10-30 HISTORY — DX: Obstructive sleep apnea (adult) (pediatric): G47.33

## 2017-10-30 MED ORDER — NYSTATIN 100000 UNIT/GM EX CREA: 1.0000 "application " | TOPICAL_CREAM | Freq: Two times a day (BID) | CUTANEOUS | 1 refills | Status: DC

## 2017-10-30 NOTE — Telephone Encounter (Signed)
HST 10/26/17 >> AHI 17.4, SaO2 low 79%.   Will have my nurse inform pt that sleep study shows moderate sleep apnea.  Options are 1) CPAP now, 2) ROV first.  If She is agreeable to CPAP, then please send order for auto CPAP range 5 to 15 cm H2O with heated humidity and mask of choice.  Have download sent 1 month after starting CPAP and set up ROV 2 months after starting CPAP.  ROV can be with me or NP.

## 2017-10-31 NOTE — Telephone Encounter (Signed)
Called and spoke with patient today regarding results and recommendations.  Informed the patient of results today. Pt is not wanting cpap machine at all, would like ov to discuss oral appliance options Schedule appt for Wed Jan 30 at 9am- double book  The patient verbalized understanding and denied any questions or concerns at this time.  Nothing further needed.

## 2017-11-01 ENCOUNTER — Other Ambulatory Visit: Payer: Self-pay | Admitting: *Deleted

## 2017-11-01 DIAGNOSIS — R29818 Other symptoms and signs involving the nervous system: Secondary | ICD-10-CM

## 2017-11-14 ENCOUNTER — Ambulatory Visit: Payer: Medicare HMO | Admitting: Pulmonary Disease

## 2017-11-30 ENCOUNTER — Other Ambulatory Visit: Payer: Self-pay | Admitting: Family Medicine

## 2017-12-10 ENCOUNTER — Encounter: Payer: Self-pay | Admitting: Pulmonary Disease

## 2017-12-10 ENCOUNTER — Ambulatory Visit: Payer: Medicare HMO | Admitting: Pulmonary Disease

## 2017-12-10 VITALS — BP 128/80 | HR 59 | Ht 62.0 in | Wt 161.4 lb

## 2017-12-10 DIAGNOSIS — G4733 Obstructive sleep apnea (adult) (pediatric): Secondary | ICD-10-CM

## 2017-12-10 DIAGNOSIS — Z7189 Other specified counseling: Secondary | ICD-10-CM

## 2017-12-10 NOTE — Progress Notes (Signed)
[facility]  Chief Complaint  Patient presents with  . Follow-up    Pt here for Sleep Study results and to discuss oral appliance options.    Vital signs: BP 128/80 (BP Location: Left Arm, Cuff Size: Normal)   Pulse (!) 59   Ht 5\' 2"  (1.575 m)   Wt 161 lb 6.4 oz (73.2 kg)   SpO2 98%   BMI 29.52 kg/m   History of Present Illness: Mandy [name] is a 72 y.o. female with obstructive sleep apnea.  She had her home sleep study in January.  This showed moderate sleep apnea.  She had several questions about treatment options for sleep apnea.  Physical Exam:  General - pleasant Eyes - pupils reactive ENT - no sinus tenderness, no oral exudate, no LAN, MP 4, scalloped tongue Cardiac - regular, no murmur Chest - no wheeze, rales Abd - soft, non tender Ext - no edema Skin - no rashes Neuro - normal strength Psych - normal mood  Assessment/Plan:  Obstructive sleep apnea. - We discussed how sleep apnea can affect various health problems, including risks for hypertension, cardiovascular disease, and diabetes.  We also discussed how sleep disruption can increase risks for accidents, such as while driving.  Weight loss as a means of improving sleep apnea was also reviewed.  Additional treatment options discussed were CPAP therapy, oral appliance, and surgical intervention. - she is willing to try CPAP - will arrange for auto CPAP - discussed options to assist with cleaning her device   Patient Instructions  Will arrange for CPAP set up  Follow up in 2 months after getting CPAP machine    Chesley Mires, MD [place] 12/10/2017, 11:54 AM Pager:  [phone]  Flow Sheet  Sleep tests: HST 10/26/17 >> AHI 17.4, SaO2 low 79%  Past Medical History: She  has a past medical history of Atrophic vaginitis (11/18/2012), Chicken pox (as a child), Cough (07/14/2016), Dermatitis (04/26/2011), GERD (gastroesophageal reflux disease)  (03/20/2017), HTN (hypertension) (04/13/2010), Measles (as a child), Miscarriage (1981), Onychomycosis (04/26/2011), OSA (obstructive sleep apnea) (10/30/2017), Osteopenia (10/17/1999), Osteopenia (04/13/2010), Preventative health care (07/14/2016), Screening for malignant neoplasm of the cervix (04/26/2011), Tinea pedis (05/10/2014), and Urinary frequency (03/20/2017).  Past Surgical History: She  has a past surgical history that includes Dilation and curettage of uterus (1981).  Family History: Her family history includes Arthritis in her other; Asthma in her maternal grandfather; Cancer in her paternal grandmother; Coronary artery disease in her mother; Dementia in her brother, maternal grandmother, mother, and paternal grandmother; Diabetes in her father and paternal grandfather; Hearing loss in her paternal grandfather; Heart attack (age of onset: 37) in her mother; Heart disease in her father; Hyperlipidemia in her other; Hypertension in her mother and other; Osteoporosis in her mother; Other in her other; Stroke in her father.  Social History: She  reports that she quit smoking about 17 years ago. Her smoking use included cigarettes. She has a 40.00 pack-year smoking history. she has never used smokeless tobacco. She reports that she does not drink alcohol or use drugs.  Medications: Allergies as of 12/10/2017      Reactions   Codeine    Pneumococcal Vaccine Polyvalent    REACTION: rash at injection site and induration.      Medication List        Accurate as of 12/10/17 11:54 AM. Always use your most recent med list.          aspirin  81 MG tablet Take 81 mg by mouth daily.   augmented betamethasone dipropionate 0.05 % ointment Commonly known as:  DIPROLENE-AF Apply topically 2 (two) times daily.   b complex vitamins tablet Take 1 tablet by mouth daily.   calcium citrate-vitamin D 315-200 MG-UNIT tablet Commonly known as:  CITRACAL+D Take 1 tablet by mouth 2 (two) times daily.     COROMEGA PO Take by mouth.   Melatonin 10 MG Caps Take by mouth at bedtime.   multivitamin per tablet Take 1 tablet by mouth daily.   mupirocin nasal ointment 2 % Commonly known as:  BACTROBAN Apply to nares via Qtip every night at bedtime x 10 days.   nystatin cream Commonly known as:  MYCOSTATIN Apply 1 application topically 2 (two) times daily.   PREMARIN vaginal cream Generic drug:  conjugated estrogens APPLY A SMALL AMOUNT PER VAGINA 3 TIMES A WEEK AT BEDTIME   PROBIOTIC DAILY PO Take by mouth daily.   triamterene-hydrochlorothiazide 37.5-25 MG capsule Commonly known as:  DYAZIDE TAKE (1) CAPSULE DAILY.   TURMERIC PO Take by mouth daily.   vitamin C 500 MG tablet Commonly known as:  ASCORBIC ACID Take 500 mg by mouth daily.

## 2017-12-10 NOTE — Patient Instructions (Signed)
Will arrange for CPAP set up  Follow up in 2 months after getting CPAP machine 

## 2017-12-21 DIAGNOSIS — G4733 Obstructive sleep apnea (adult) (pediatric): Secondary | ICD-10-CM | POA: Diagnosis not present

## 2017-12-26 DIAGNOSIS — K641 Second degree hemorrhoids: Secondary | ICD-10-CM | POA: Diagnosis not present

## 2017-12-31 ENCOUNTER — Other Ambulatory Visit: Payer: Self-pay | Admitting: Family Medicine

## 2018-01-11 DIAGNOSIS — G4733 Obstructive sleep apnea (adult) (pediatric): Secondary | ICD-10-CM | POA: Diagnosis not present

## 2018-01-21 DIAGNOSIS — G4733 Obstructive sleep apnea (adult) (pediatric): Secondary | ICD-10-CM | POA: Diagnosis not present

## 2018-01-23 DIAGNOSIS — K641 Second degree hemorrhoids: Secondary | ICD-10-CM | POA: Diagnosis not present

## 2018-01-31 ENCOUNTER — Other Ambulatory Visit: Payer: Self-pay | Admitting: Family Medicine

## 2018-01-31 NOTE — Progress Notes (Addendum)
Subjective:   Mandy [name] is a 72 y.o. female who presents for Medicare Annual (Subsequent) preventive examination. 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  Review of Systems: No ROS.  Medicare Wellness Visit. Additional risk factors are reflected in the social history.  Cardiac Risk Factors include: advanced age (>31men, >22 women);dyslipidemia;hypertension Sleep patterns:  Wears CPAP. Sleeps 5-6 hrs. Has appt with Dr.Sood tomorrow to follow up on still not sleeping well. Home Safety/Smoke Alarms: Feels safe in home. Smoke alarms in place.  Living environment; residence and Firearm Safety: Lives in 2 story home with husband and dog.    Female:   Pap-  2015     Mammo- ordered      Dexa scan-utd        CCS- due 2021    Objective:     Vitals: BP 140/78 (BP Location: Left Arm, Patient Position: Sitting, Cuff Size: Normal)   Pulse (!) 56   Ht 5\' 2"  (1.575 m)   Wt 165 lb 9.6 oz (75.1 kg)   SpO2 97%   BMI 30.29 kg/m   Body mass index is 30.29 kg/m.  Advanced Directives 02/05/2018 02/01/2017 05/13/2015  Does Patient Have a Medical Advance Directive? Yes Yes Yes  Type of Paramedic of [place];Living will [place];Living will [place];Living will  Does patient want to make changes to medical advance directive? - - No - Patient declined  Copy of [place] in Chart? Yes No - copy requested No - copy requested    Tobacco Social History   Tobacco Use  Smoking Status Former Smoker  . Packs/day: 1.00  . Years: 40.00  . Pack years: 40.00  . Types: Cigarettes  . Last attempt to quit: 10/16/2000  . Years since quitting: 17.3  Smokeless Tobacco Never Used     Counseling given: Not Answered   Clinical Intake: Pain : No/denies pain     Past Medical History:  Diagnosis Date  . Atrophic vaginitis 11/18/2012  . Chicken pox as a child  . Cough 07/14/2016  . Dermatitis 04/26/2011  . GERD  (gastroesophageal reflux disease) 03/20/2017  . HTN (hypertension) 04/13/2010   Qualifier: Diagnosis of  By: Charlett Blake MD, Erline Levine    . Measles as a child  . Miscarriage 1981  . Onychomycosis 04/26/2011  . OSA (obstructive sleep apnea) 10/30/2017  . Osteopenia 10/17/1999  . Osteopenia 04/13/2010   Qualifier: Diagnosis of  By: Charlett Blake MD, Erline Levine    . Preventative health care 07/14/2016  . Screening for malignant neoplasm of the cervix 04/26/2011  . Tinea pedis 05/10/2014  . Urinary frequency 03/20/2017   Past Surgical History:  Procedure Laterality Date  . DILATION AND CURETTAGE OF UTERUS  1981   miscarriage   Family History  Problem Relation Age of Onset  . Osteoporosis Mother   . Coronary artery disease Mother   . Heart attack Mother 53  . Hypertension Mother   . Dementia Mother   . Diabetes Father        type 2  . Heart disease Father   . Stroke Father   . Dementia Maternal Grandmother   . Asthma Maternal Grandfather   . Cancer Paternal Grandmother        ovarian  . Dementia Paternal Grandmother   . Diabetes Paternal Grandfather        tyoe 2  . Hearing loss Paternal Grandfather   . Dementia Brother  Lewy Body  . Arthritis Other   . Hyperlipidemia Other   . Hypertension Other   . Other Other        Cardiovascular disorder   Social History   Socioeconomic History  . Marital status: Married    Spouse name: Not on file  . Number of children: Not on file  . Years of education: Not on file  . Highest education level: Not on file  Occupational History  . Occupation: bookkeeper  Scientific laboratory technician  . Financial resource strain: Not on file  . Food insecurity:    Worry: Not on file    Inability: Not on file  . Transportation needs:    Medical: Not on file    Non-medical: Not on file  Tobacco Use  . Smoking status: Former Smoker    Packs/day: 1.00    Years: 40.00    Pack years: 40.00    Types: Cigarettes    Last attempt to quit: 10/16/2000    Years since quitting: 17.3  .  Smokeless tobacco: Never Used  Substance and Sexual Activity  . Alcohol use: No  . Drug use: No  . Sexual activity: Yes    Partners: Male  Lifestyle  . Physical activity:    Days per week: Not on file    Minutes per session: Not on file  . Stress: Not on file  Relationships  . Social connections:    Talks on phone: Not on file    Gets together: Not on file    Attends religious service: Not on file    Active member of club or organization: Not on file    Attends meetings of clubs or organizations: Not on file    Relationship status: Not on file  Other Topics Concern  . Not on file  Social History Narrative   Regular exercise- yes   Does Yoga 3-4 X week, personal trainer 1 X week, and walks regularly   Has lost 16 pounds by minimizing processed foods and meats    Outpatient Encounter Medications as of 02/05/2018  Medication Sig  . aspirin 81 MG tablet Take 81 mg by mouth daily.  Marland Kitchen b complex vitamins tablet Take 1 tablet by mouth daily.  . calcium citrate-vitamin D (CITRACAL+D) 315-200 MG-UNIT per tablet Take 1 tablet by mouth 2 (two) times daily.    . Loperamide HCl (IMODIUM PO) Take by mouth.  . Methylsulfonylmethane (MSM PO) Take by mouth.  . multivitamin (THERAGRAN) per tablet Take 1 tablet by mouth daily.    . mupirocin nasal ointment (BACTROBAN) 2 % Apply to nares via Qtip every night at bedtime x 10 days.  . Omega-3 Fatty Acids-Vitamin E (COROMEGA PO) Take by mouth.  Marland Kitchen PREMARIN vaginal cream APPLY A SMALL AMOUNT PER VAGINA 3 TIMES A WEEK AT BEDTIME  . Probiotic Product (PROBIOTIC DAILY PO) Take by mouth daily.  Marland Kitchen triamterene-hydrochlorothiazide (DYAZIDE) 37.5-25 MG capsule TAKE (1) CAPSULE DAILY.  Marland Kitchen TURMERIC PO Take by mouth daily.  . vitamin C (ASCORBIC ACID) 500 MG tablet Take 500 mg by mouth daily.  . Zinc Acetate, Oral, (ZINC ACETATE PO) Take by mouth.  Marland Kitchen ZINC CHELATED PO Take by mouth.  Marland Kitchen augmented betamethasone dipropionate (DIPROLENE-AF) 0.05 % ointment Apply  topically 2 (two) times daily.  Marland Kitchen nystatin cream (MYCOSTATIN) Apply 1 application topically 2 (two) times daily. (Patient not taking: Reported on 02/05/2018)  . [DISCONTINUED] Melatonin 10 MG CAPS Take by mouth at bedtime.   No facility-administered encounter medications on file  as of 02/05/2018.     Activities of Daily Living In your present state of health, do you have any difficulty performing the following activities: 02/05/2018  Hearing? N  Vision? N  Comment Dr.Lyles every years. Bifocals.  Difficulty concentrating or making decisions? N  Walking or climbing stairs? N  Dressing or bathing? N  Doing errands, shopping? N  Preparing Food and eating ? N  Using the Toilet? N  In the past six months, have you accidently leaked urine? N  Do you have problems with loss of bowel control? N  Managing your Medications? N  Managing your Finances? N  Housekeeping or managing your Housekeeping? N  Some recent data might be hidden    Patient Care Team: Mosie Lukes, MD as PCP - General Rolm Bookbinder, MD as Consulting Physician (Dermatology) Daryll Brod, MD as Consulting Physician (Orthopedic Surgery) Chesley Mires, MD as Consulting Physician (Pulmonary Disease)    Assessment:   This is a routine wellness examination for [place]. Physical assessment deferred to PCP.  Exercise Activities and Dietary recommendations Current Exercise Habits: Home exercise routine, Type of exercise: walking, Time (Minutes): 45, Frequency (Times/Week): 7, Weekly Exercise (Minutes/Week): 315, Intensity: Mild   Diet (meal preparation, eat out, water intake, caffeinated beverages, dairy products, fruits and vegetables): in general, a "healthy" diet  , well balanced   Goals    . Patient Stated     Sleep 7-8 hrs at night.    . Weight (lb) < 150 lb (68 kg)     With diet and exercise        Fall Risk Fall Risk  02/05/2018 02/01/2017 02/07/2016 05/13/2015  Falls in the past year? No No No No     Depression  Screen PHQ 2/9 Scores 02/05/2018 02/01/2017 02/07/2016 05/13/2015  PHQ - 2 Score 0 0 0 0  Exception Documentation - - Patient refusal -     Cognitive Function Ad8 score reviewed for issues:  Issues making decisions:no  Less interest in hobbies / activities:no  Repeats questions, stories (family complaining):no  Trouble using ordinary gadgets (microwave, computer, phone):no  Forgets the month or year: no  Mismanaging finances: no  Remembering appts:no  Daily problems with thinking and/or memory:no Ad8 score is=097  MMSE - Mini Mental State Exam 02/01/2017  Orientation to time 5  Orientation to Place 5  Registration 3  Attention/ Calculation 5  Recall 2  Language- name 2 objects 2  Language- repeat 1  Language- follow 3 step command 3  Language- read & follow direction 1  Write a sentence 1  Copy design 1  Total score 29        Immunization History  Administered Date(s) Administered  . Hepatitis B 04/13/2008, 06/15/2008, 12/17/2008  . Influenza Split 08/03/2011, 07/31/2012  . Influenza, High Dose Seasonal PF 07/23/2013, 07/30/2015, 07/14/2016, 07/26/2017  . Influenza,inj,Quad PF,6+ Mos 07/08/2014  . Pneumococcal Conjugate-13 10/30/2013  . Pneumococcal Polysaccharide-23 12/23/2009, 12/21/2015  . Td 05/11/2010  . Zoster 09/23/2008    Screening Tests Health Maintenance  Topic Date Due  . INFLUENZA VACCINE  05/16/2018  . MAMMOGRAM  07/14/2018  . TETANUS/TDAP  05/11/2020  . COLONOSCOPY  07/11/2022  . DEXA SCAN  Completed  . Hepatitis C Screening  Completed  . PNA vac Low Risk Adult  Completed      Plan:   Please schedule appointment to follow up with Dr.Blyth in 6 months and myself in 1 yr.   I have ordered your mammogram. They will call you  to schedule or you may stop down stairs.    Continue to eat heart healthy diet (full of fruits, vegetables, whole grains, lean protein, water--limit salt, fat, and sugar intake) and increase physical activity as  tolerated.  Continue doing brain stimulating activities (puzzles, reading, adult coloring books, staying active) to keep memory sharp.     I have personally reviewed and noted the following in the patient's chart:   . Medical and social history . Use of alcohol, tobacco or illicit drugs  . Current medications and supplements . Functional ability and status . Nutritional status . Physical activity . Advanced directives . List of other physicians . Hospitalizations, surgeries, and ER visits in previous 12 months . Vitals . Screenings to include cognitive, depression, and falls . Referrals and appointments  In addition, I have reviewed and discussed with patient certain preventive protocols, quality metrics, and best practice recommendations. A written personalized care plan for preventive services as well as general preventive health recommendations were provided to patient.     Naaman Plummer [place], [place]  02/05/2018  Kathlene November, MD

## 2018-02-05 ENCOUNTER — Ambulatory Visit (INDEPENDENT_AMBULATORY_CARE_PROVIDER_SITE_OTHER): Payer: Medicare HMO | Admitting: *Deleted

## 2018-02-05 ENCOUNTER — Encounter: Payer: Self-pay | Admitting: *Deleted

## 2018-02-05 VITALS — BP 140/78 | HR 56 | Ht 62.0 in | Wt 165.6 lb

## 2018-02-05 DIAGNOSIS — Z1239 Encounter for other screening for malignant neoplasm of breast: Secondary | ICD-10-CM

## 2018-02-05 DIAGNOSIS — Z1231 Encounter for screening mammogram for malignant neoplasm of breast: Secondary | ICD-10-CM | POA: Diagnosis not present

## 2018-02-05 DIAGNOSIS — Z0000 Encounter for general adult medical examination without abnormal findings: Secondary | ICD-10-CM | POA: Diagnosis not present

## 2018-02-05 NOTE — Patient Instructions (Signed)
Please schedule appointment to follow up with Dr.Blyth in 6 months and myself in 1 yr.   I have ordered your mammogram. They will call you to schedule or you may stop down stairs.    Continue to eat heart healthy diet (full of fruits, vegetables, whole grains, lean protein, water--limit salt, fat, and sugar intake) and increase physical activity as tolerated.  Continue doing brain stimulating activities (puzzles, reading, adult coloring books, staying active) to keep memory sharp.     Mandy [name] , Thank you for taking time to come for your Medicare Wellness Visit. I appreciate your ongoing commitment to your health goals. Please review the following plan we discussed and let me know if I can assist you in the future.   These are the goals we discussed: Goals    . Patient Stated     Sleep 7-8 hrs at night.    . Weight (lb) < 150 lb (68 kg)     With diet and exercise        This is a list of the screening recommended for you and due dates:  Health Maintenance  Topic Date Due  . Flu Shot  05/16/2018  . Mammogram  07/14/2018  . Tetanus Vaccine  05/11/2020  . Colon Cancer Screening  07/11/2022  . DEXA scan (bone density measurement)  Completed  .  Hepatitis C: One time screening is recommended by Center for Disease Control  (CDC) for  adults born from [birth year] through 1966.   Completed  . Pneumonia vaccines  Completed    Health Maintenance for Postmenopausal Women Menopause is a normal process in which your reproductive ability comes to an end. This process happens gradually over a span of months to years, usually between the ages of 42 and 75. Menopause is complete when you have missed 12 consecutive menstrual periods. It is important to talk with your health care provider about some of the most common conditions that affect postmenopausal women, such as heart disease, cancer, and bone loss (osteoporosis). Adopting a healthy lifestyle and getting preventive care can help to promote  your health and wellness. Those actions can also lower your chances of developing some of these common conditions. What should I know about menopause? During menopause, you may experience a number of symptoms, such as:  Moderate-to-severe hot flashes.  Night sweats.  Decrease in sex drive.  Mood swings.  Headaches.  Tiredness.  Irritability.  Memory problems.  Insomnia.  Choosing to treat or not to treat menopausal changes is an individual decision that you make with your health care provider. What should I know about hormone replacement therapy and supplements? Hormone therapy products are effective for treating symptoms that are associated with menopause, such as hot flashes and night sweats. Hormone replacement carries certain risks, especially as you become older. If you are thinking about using estrogen or estrogen with progestin treatments, discuss the benefits and risks with your health care provider. What should I know about heart disease and stroke? Heart disease, heart attack, and stroke become more likely as you age. This may be due, in part, to the hormonal changes that your body experiences during menopause. These can affect how your body processes dietary fats, triglycerides, and cholesterol. Heart attack and stroke are both medical emergencies. There are many things that you can do to help prevent heart disease and stroke:  Have your blood pressure checked at least every 1-2 years. High blood pressure causes heart disease and increases the risk of stroke.  If you are 73-73 years old, ask your health care provider if you should take aspirin to prevent a heart attack or a stroke.  Do not use any tobacco products, including cigarettes, chewing tobacco, or electronic cigarettes. If you need help quitting, ask your health care provider.  It is important to eat a healthy diet and maintain a healthy weight. ? Be sure to include plenty of vegetables, fruits, low-fat dairy  products, and lean protein. ? Avoid eating foods that are high in solid fats, added sugars, or salt (sodium).  Get regular exercise. This is one of the most important things that you can do for your health. ? Try to exercise for at least 150 minutes each week. The type of exercise that you do should increase your heart rate and make you sweat. This is known as moderate-intensity exercise. ? Try to do strengthening exercises at least twice each week. Do these in addition to the moderate-intensity exercise.  Know your numbers.Ask your health care provider to check your cholesterol and your blood glucose. Continue to have your blood tested as directed by your health care provider.  What should I know about cancer screening? There are several types of cancer. Take the following steps to reduce your risk and to catch any cancer development as early as possible. Breast Cancer  Practice breast self-awareness. ? This means understanding how your breasts normally appear and feel. ? It also means doing regular breast self-exams. Let your health care provider know about any changes, no matter how small.  If you are 73 or older, have a clinician do a breast exam (clinical breast exam or CBE) every year. Depending on your age, family history, and medical history, it may be recommended that you also have a yearly breast X-ray (mammogram).  If you have a family history of breast cancer, talk with your health care provider about genetic screening.  If you are at high risk for breast cancer, talk with your health care provider about having an MRI and a mammogram every year.  Breast cancer (BRCA) gene test is recommended for women who have family members with BRCA-related cancers. Results of the assessment will determine the need for genetic counseling and BRCA1 and for BRCA2 testing. BRCA-related cancers include these types: ? Breast. This occurs in males or females. ? Ovarian. ? Tubal. This may also be  called fallopian tube cancer. ? Cancer of the abdominal or pelvic lining (peritoneal cancer). ? Prostate. ? Pancreatic.  Cervical, Uterine, and Ovarian Cancer Your health care provider may recommend that you be screened regularly for cancer of the pelvic organs. These include your ovaries, uterus, and vagina. This screening involves a pelvic exam, which includes checking for microscopic changes to the surface of your cervix (Pap test).  For women ages 21-65, health care providers may recommend a pelvic exam and a Pap test every three years. For women ages 54-65, they may recommend the Pap test and pelvic exam, combined with testing for human papilloma virus (HPV), every five years. Some types of HPV increase your risk of cervical cancer. Testing for HPV may also be done on women of any age who have unclear Pap test results.  Other health care providers may not recommend any screening for nonpregnant women who are considered low risk for pelvic cancer and have no symptoms. Ask your health care provider if a screening pelvic exam is right for you.  If you have had past treatment for cervical cancer or a condition that could  lead to cancer, you need Pap tests and screening for cancer for at least 20 years after your treatment. If Pap tests have been discontinued for you, your risk factors (such as having a new sexual partner) need to be reassessed to determine if you should start having screenings again. Some women have medical problems that increase the chance of getting cervical cancer. In these cases, your health care provider may recommend that you have screening and Pap tests more often.  If you have a family history of uterine cancer or ovarian cancer, talk with your health care provider about genetic screening.  If you have vaginal bleeding after reaching menopause, tell your health care provider.  There are currently no reliable tests available to screen for ovarian cancer.  Lung  Cancer Lung cancer screening is recommended for adults 70-44 years old who are at high risk for lung cancer because of a history of smoking. A yearly low-dose CT scan of the lungs is recommended if you:  Currently smoke.  Have a history of at least 30 pack-years of smoking and you currently smoke or have quit within the past 15 years. A pack-year is smoking an average of one pack of cigarettes per day for one year.  Yearly screening should:  Continue until it has been 15 years since you quit.  Stop if you develop a health problem that would prevent you from having lung cancer treatment.  Colorectal Cancer  This type of cancer can be detected and can often be prevented.  Routine colorectal cancer screening usually begins at age 28 and continues through age 84.  If you have risk factors for colon cancer, your health care provider may recommend that you be screened at an earlier age.  If you have a family history of colorectal cancer, talk with your health care provider about genetic screening.  Your health care provider may also recommend using home test kits to check for hidden blood in your stool.  A small camera at the end of a tube can be used to examine your colon directly (sigmoidoscopy or colonoscopy). This is done to check for the earliest forms of colorectal cancer.  Direct examination of the colon should be repeated every 5-10 years until age 60. However, if early forms of precancerous polyps or small growths are found or if you have a family history or genetic risk for colorectal cancer, you may need to be screened more often.  Skin Cancer  Check your skin from head to toe regularly.  Monitor any moles. Be sure to tell your health care provider: ? About any new moles or changes in moles, especially if there is a change in a mole's shape or color. ? If you have a mole that is larger than the size of a pencil eraser.  If any of your family members has a history of skin  cancer, especially at a young age, talk with your health care provider about genetic screening.  Always use sunscreen. Apply sunscreen liberally and repeatedly throughout the day.  Whenever you are outside, protect yourself by wearing long sleeves, pants, a wide-brimmed hat, and sunglasses.  What should I know about osteoporosis? Osteoporosis is a condition in which bone destruction happens more quickly than new bone creation. After menopause, you may be at an increased risk for osteoporosis. To help prevent osteoporosis or the bone fractures that can happen because of osteoporosis, the following is recommended:  If you are 79-87 years old, get at least 1,000 mg of  calcium and at least 600 mg of vitamin D per day.  If you are older than age 6 but younger than age 55, get at least 1,200 mg of calcium and at least 600 mg of vitamin D per day.  If you are older than age 37, get at least 1,200 mg of calcium and at least 800 mg of vitamin D per day.  Smoking and excessive alcohol intake increase the risk of osteoporosis. Eat foods that are rich in calcium and vitamin D, and do weight-bearing exercises several times each week as directed by your health care provider. What should I know about how menopause affects my mental health? Depression may occur at any age, but it is more common as you become older. Common symptoms of depression include:  Low or sad mood.  Changes in sleep patterns.  Changes in appetite or eating patterns.  Feeling an overall lack of motivation or enjoyment of activities that you previously enjoyed.  Frequent crying spells.  Talk with your health care provider if you think that you are experiencing depression. What should I know about immunizations? It is important that you get and maintain your immunizations. These include:  Tetanus, diphtheria, and pertussis (Tdap) booster vaccine.  Influenza every year before the flu season begins.  Pneumonia  vaccine.  Shingles vaccine.  Your health care provider may also recommend other immunizations. This information is not intended to replace advice given to you by your health care provider. Make sure you discuss any questions you have with your health care provider. Document Released: 11/24/2005 Document Revised: 04/21/2016 Document Reviewed: 07/06/2015 Elsevier Interactive Patient Education  2018 [employer].

## 2018-02-06 ENCOUNTER — Ambulatory Visit: Payer: Medicare HMO | Admitting: Pulmonary Disease

## 2018-02-06 ENCOUNTER — Encounter: Payer: Self-pay | Admitting: Pulmonary Disease

## 2018-02-06 VITALS — BP 118/78 | HR 59 | Ht 63.0 in | Wt 165.0 lb

## 2018-02-06 DIAGNOSIS — G4733 Obstructive sleep apnea (adult) (pediatric): Secondary | ICD-10-CM | POA: Diagnosis not present

## 2018-02-06 NOTE — Progress Notes (Signed)
[facility]  Chief Complaint  Patient presents with  . Follow-up    Pt is on new cpap machine set up, doing ok. Pt doesn't sleep well with the cpap machine, pt gained 15lbs in last few months.    Vital signs: BP 118/78 (BP Location: Left Arm, Cuff Size: Normal)   Pulse (!) 59   Ht 5\' 3"  (1.6 m)   Wt 165 lb (74.8 kg)   SpO2 93%   BMI 29.23 kg/m   History of Present Illness: Mandy [name] is a 72 y.o. female with obstructive sleep apnea.  She had trouble with her mask.  Was using nasal pillow.  Had leak.  Changed to nasal mask 1 month ago and doing better.  She is getting better quality sleep.  Still feels tired toward end of day.  She goes to bed at 9 pm.  Falls asleep after reading.  Gets up at 3 am.    Physical Exam:  General - pleasant Eyes - pupils reactive, wears glasses ENT - no sinus tenderness, no oral exudate, no LAN, MP 4, scalloped tongue Cardiac - regular, no murmur Chest - no wheeze, rales Abd - soft, non tender Ext - no edema Skin - no rashes Neuro - normal strength Psych - normal mood  Assessment/Plan:  Obstructive sleep apnea. - she is compliant with CPAP and reports benefit - will call with CPAP report - continue auto CPAP  Insufficient sleep. - advised her to try going to bed later so that she can sleep through the night better   Patient Instructions  Follow up in 1 year    Chesley Mires, MD [place] 02/06/2018, 2:34 PM Pager:  [phone]  Flow Sheet  Sleep tests: HST 10/26/17 >> AHI 17.4, SaO2 low 79%  Past Medical History: She  has a past medical history of Atrophic vaginitis (11/18/2012), Chicken pox (as a child), Cough (07/14/2016), Dermatitis (04/26/2011), GERD (gastroesophageal reflux disease) (03/20/2017), HTN (hypertension) (04/13/2010), Measles (as a child), Miscarriage (1981), Onychomycosis (04/26/2011), OSA (obstructive sleep apnea) (10/30/2017), Osteopenia (10/17/1999),  Osteopenia (04/13/2010), Preventative health care (07/14/2016), Screening for malignant neoplasm of the cervix (04/26/2011), Tinea pedis (05/10/2014), and Urinary frequency (03/20/2017).  Past Surgical History: She  has a past surgical history that includes Dilation and curettage of uterus (1981).  Family History: Her family history includes Arthritis in her other; Asthma in her maternal grandfather; Cancer in her paternal grandmother; Coronary artery disease in her mother; Dementia in her brother, maternal grandmother, mother, and paternal grandmother; Diabetes in her father and paternal grandfather; Hearing loss in her paternal grandfather; Heart attack (age of onset: 28) in her mother; Heart disease in her father; Hyperlipidemia in her other; Hypertension in her mother and other; Osteoporosis in her mother; Other in her other; Stroke in her father.  Social History: She  reports that she quit smoking about 17 years ago. Her smoking use included cigarettes. She has a 40.00 pack-year smoking history. She has never used smokeless tobacco. She reports that she does not drink alcohol or use drugs.  Medications: Allergies as of 02/06/2018      Reactions   Codeine    Pneumococcal Vaccine Polyvalent    REACTION: rash at injection site and induration.      Medication List        Accurate as of 02/06/18  2:34 PM. Always use your most recent med list.          aspirin 81 MG tablet Take  81 mg by mouth daily.   augmented betamethasone dipropionate 0.05 % ointment Commonly known as:  DIPROLENE-AF Apply topically 2 (two) times daily.   b complex vitamins tablet Take 1 tablet by mouth daily.   calcium citrate-vitamin D 315-200 MG-UNIT tablet Commonly known as:  CITRACAL+D Take 1 tablet by mouth 2 (two) times daily.   COROMEGA PO Take by mouth.   IMODIUM PO Take by mouth.   MSM PO Take by mouth.   multivitamin per tablet Take 1 tablet by mouth daily.   mupirocin nasal ointment 2  % Commonly known as:  BACTROBAN Apply to nares via Qtip every night at bedtime x 10 days.   nystatin cream Commonly known as:  MYCOSTATIN Apply 1 application topically 2 (two) times daily.   PREMARIN vaginal cream Generic drug:  conjugated estrogens APPLY A SMALL AMOUNT PER VAGINA 3 TIMES A WEEK AT BEDTIME   PROBIOTIC DAILY PO Take by mouth daily.   triamterene-hydrochlorothiazide 37.5-25 MG capsule Commonly known as:  DYAZIDE TAKE (1) CAPSULE DAILY.   TURMERIC PO Take by mouth daily.   vitamin C 500 MG tablet Commonly known as:  ASCORBIC ACID Take 500 mg by mouth daily.   ZINC ACETATE PO Take by mouth.   ZINC CHELATED PO Take by mouth.

## 2018-02-06 NOTE — Patient Instructions (Signed)
Follow up in 1 year.

## 2018-02-13 ENCOUNTER — Ambulatory Visit (HOSPITAL_BASED_OUTPATIENT_CLINIC_OR_DEPARTMENT_OTHER)
Admission: RE | Admit: 2018-02-13 | Discharge: 2018-02-13 | Disposition: A | Discharge status: Home / Self Care | Payer: Medicare HMO | Source: Ambulatory Visit | Attending: Internal Medicine | Admitting: Internal Medicine

## 2018-02-13 DIAGNOSIS — Z1231 Encounter for screening mammogram for malignant neoplasm of breast: Secondary | ICD-10-CM | POA: Diagnosis not present

## 2018-02-13 DIAGNOSIS — Z1239 Encounter for other screening for malignant neoplasm of breast: Secondary | ICD-10-CM

## 2018-02-15 ENCOUNTER — Telehealth: Payer: Self-pay | Admitting: Pulmonary Disease

## 2018-02-15 NOTE — Telephone Encounter (Signed)
Called [employer] to see if pt could be added to [place]. Spoke with Kenney Houseman who was able to add pt in [place] so we could print off a compliance report.  Download printed. Called pt letting her know it has been printed and we were going to let VS review this. Stated to her once he reviewed this, we would call her once we had results from [place] shown to Dr. Halford Chessman and per VS, pt's report looks great.  Called pt back to let her know per VS the download of her cpap information looked great. Asked pt if she was having any concerns with her cpap and per pt everything is going good. Per Vida Roller, they would call pt once we were able to see a download of pt's information.  Stated to pt if she needed anything from [place] to call our office.  Pt expressed understanding. Nothing further needed at this time.

## 2018-02-17 ENCOUNTER — Telehealth: Payer: Self-pay | Admitting: Pulmonary Disease

## 2018-02-17 NOTE — Telephone Encounter (Signed)
Auto CPAP 01/13/18 to 02/11/18 >> used on 30 of 30 nights with average 6 hrs 14 min.  Average AHI 0.9 with median CPAP 7 and 95 th percentile CPAP 11 cm H2O   Please let her know that CPAP report shows good control of sleep apnea on current settings.

## 2018-02-18 NOTE — Telephone Encounter (Signed)
Called and spoke with patient regarding results.  Informed the patient of results and recommendations today. Pt verbalized understanding and denied any questions or concerns at this time.  Nothing further needed.  

## 2018-02-20 DIAGNOSIS — G4733 Obstructive sleep apnea (adult) (pediatric): Secondary | ICD-10-CM | POA: Diagnosis not present

## 2018-03-02 ENCOUNTER — Other Ambulatory Visit: Payer: Self-pay | Admitting: Family Medicine

## 2018-03-23 DIAGNOSIS — G4733 Obstructive sleep apnea (adult) (pediatric): Secondary | ICD-10-CM | POA: Diagnosis not present

## 2018-03-24 DIAGNOSIS — G4733 Obstructive sleep apnea (adult) (pediatric): Secondary | ICD-10-CM | POA: Diagnosis not present

## 2018-04-01 ENCOUNTER — Other Ambulatory Visit: Payer: Self-pay | Admitting: Family Medicine

## 2018-04-22 DIAGNOSIS — H2513 Age-related nuclear cataract, bilateral: Secondary | ICD-10-CM | POA: Diagnosis not present

## 2018-04-22 DIAGNOSIS — H5203 Hypermetropia, bilateral: Secondary | ICD-10-CM | POA: Diagnosis not present

## 2018-04-22 DIAGNOSIS — G4733 Obstructive sleep apnea (adult) (pediatric): Secondary | ICD-10-CM | POA: Diagnosis not present

## 2018-04-29 ENCOUNTER — Other Ambulatory Visit: Payer: Self-pay | Admitting: Family Medicine

## 2018-05-08 DIAGNOSIS — Z0100 Encounter for examination of eyes and vision without abnormal findings: Secondary | ICD-10-CM | POA: Diagnosis not present

## 2018-05-23 DIAGNOSIS — G4733 Obstructive sleep apnea (adult) (pediatric): Secondary | ICD-10-CM | POA: Diagnosis not present

## 2018-05-31 ENCOUNTER — Encounter: Payer: Self-pay | Admitting: Family Medicine

## 2018-06-03 ENCOUNTER — Other Ambulatory Visit: Payer: Self-pay | Admitting: Family Medicine

## 2018-06-03 DIAGNOSIS — R059 Cough, unspecified: Secondary | ICD-10-CM

## 2018-06-03 DIAGNOSIS — R05 Cough: Secondary | ICD-10-CM

## 2018-06-05 ENCOUNTER — Other Ambulatory Visit: Payer: Self-pay

## 2018-06-05 DIAGNOSIS — R059 Cough, unspecified: Secondary | ICD-10-CM

## 2018-06-05 DIAGNOSIS — R05 Cough: Secondary | ICD-10-CM

## 2018-06-06 ENCOUNTER — Ambulatory Visit (HOSPITAL_BASED_OUTPATIENT_CLINIC_OR_DEPARTMENT_OTHER)
Admission: RE | Admit: 2018-06-06 | Discharge: 2018-06-06 | Disposition: A | Discharge status: Home / Self Care | Payer: Medicare HMO | Source: Ambulatory Visit | Attending: Family Medicine | Admitting: Family Medicine

## 2018-06-06 DIAGNOSIS — I700 Atherosclerosis of aorta: Secondary | ICD-10-CM | POA: Diagnosis not present

## 2018-06-06 DIAGNOSIS — R059 Cough, unspecified: Secondary | ICD-10-CM

## 2018-06-06 DIAGNOSIS — R05 Cough: Secondary | ICD-10-CM | POA: Insufficient documentation

## 2018-06-07 ENCOUNTER — Other Ambulatory Visit (INDEPENDENT_AMBULATORY_CARE_PROVIDER_SITE_OTHER): Payer: Medicare HMO

## 2018-06-07 DIAGNOSIS — R05 Cough: Secondary | ICD-10-CM

## 2018-06-07 DIAGNOSIS — R059 Cough, unspecified: Secondary | ICD-10-CM

## 2018-06-09 LAB — QUANTIFERON-TB GOLD PLUS
Mitogen-NIL: >10 IU/mL
NIL: 0.04 IU/mL
QUANTIFERON-TB GOLD PLUS: NEGATIVE
TB1-NIL: 0 [IU]/mL
TB2-NIL: 0.01 [IU]/mL

## 2018-06-23 DIAGNOSIS — G4733 Obstructive sleep apnea (adult) (pediatric): Secondary | ICD-10-CM | POA: Diagnosis not present

## 2018-06-25 DIAGNOSIS — G4733 Obstructive sleep apnea (adult) (pediatric): Secondary | ICD-10-CM | POA: Diagnosis not present

## 2018-07-19 ENCOUNTER — Ambulatory Visit (INDEPENDENT_AMBULATORY_CARE_PROVIDER_SITE_OTHER): Payer: Medicare HMO

## 2018-07-19 DIAGNOSIS — Z23 Encounter for immunization: Secondary | ICD-10-CM

## 2018-07-19 NOTE — Progress Notes (Signed)
Per orders of Dr. Charlett Blake, injection of Fluzone was given by Rebecca Eaton. Patient tolerated injection well.

## 2018-07-23 DIAGNOSIS — G4733 Obstructive sleep apnea (adult) (pediatric): Secondary | ICD-10-CM | POA: Diagnosis not present

## 2018-08-06 ENCOUNTER — Ambulatory Visit: Payer: Self-pay | Admitting: Allergy and Immunology

## 2018-08-08 ENCOUNTER — Encounter: Payer: Self-pay | Admitting: Family Medicine

## 2018-08-08 ENCOUNTER — Ambulatory Visit (INDEPENDENT_AMBULATORY_CARE_PROVIDER_SITE_OTHER): Payer: Medicare HMO | Admitting: Family Medicine

## 2018-08-08 DIAGNOSIS — R05 Cough: Secondary | ICD-10-CM

## 2018-08-08 DIAGNOSIS — E782 Mixed hyperlipidemia: Secondary | ICD-10-CM | POA: Diagnosis not present

## 2018-08-08 DIAGNOSIS — K219 Gastro-esophageal reflux disease without esophagitis: Secondary | ICD-10-CM

## 2018-08-08 DIAGNOSIS — Z0000 Encounter for general adult medical examination without abnormal findings: Secondary | ICD-10-CM | POA: Diagnosis not present

## 2018-08-08 DIAGNOSIS — E559 Vitamin D deficiency, unspecified: Secondary | ICD-10-CM

## 2018-08-08 DIAGNOSIS — I10 Essential (primary) hypertension: Secondary | ICD-10-CM | POA: Diagnosis not present

## 2018-08-08 DIAGNOSIS — R059 Cough, unspecified: Secondary | ICD-10-CM

## 2018-08-08 LAB — COMPREHENSIVE METABOLIC PANEL
ALK PHOS: 43 U/L (ref 39–117)
ALT: 14 U/L (ref 0–35)
AST: 22 U/L (ref 0–37)
Albumin: 4.1 g/dL (ref 3.5–5.2)
BUN: 17 mg/dL (ref 6–23)
CO2: 30 meq/L (ref 19–32)
Calcium: 9.4 mg/dL (ref 8.4–10.5)
Chloride: 97 mEq/L (ref 96–112)
Creatinine, Ser: 0.94 mg/dL (ref 0.40–1.20)
GFR: 61.94 mL/min (ref >60.00)
GLUCOSE: 83 mg/dL (ref 70–99)
POTASSIUM: 5.2 meq/L — AB (ref 3.5–5.1)
Sodium: 133 mEq/L — ABNORMAL LOW (ref 135–145)
TOTAL PROTEIN: 6.3 g/dL (ref 6.0–8.3)
Total Bilirubin: 0.8 mg/dL (ref 0.2–1.2)

## 2018-08-08 LAB — LIPID PANEL
Cholesterol: 253 mg/dL — ABNORMAL HIGH (ref 0–200)
HDL: 75.4 mg/dL (ref >39.00)
LDL Cholesterol: 165 mg/dL — ABNORMAL HIGH (ref 0–99)
NONHDL: 177.21
TRIGLYCERIDES: 60 mg/dL (ref 0.0–149.0)
Total CHOL/HDL Ratio: 3
VLDL: 12 mg/dL (ref 0.0–40.0)

## 2018-08-08 LAB — CBC
HEMATOCRIT: 43 % (ref 36.0–46.0)
HEMOGLOBIN: 14.5 g/dL (ref 12.0–15.0)
MCHC: 33.7 g/dL (ref 30.0–36.0)
MCV: 97.9 fl (ref 78.0–100.0)
Platelets: 328 10*3/uL (ref 150.0–400.0)
RBC: 4.4 Mil/uL (ref 3.87–5.11)
RDW: 13 % (ref 11.5–15.5)
WBC: 4 10*3/uL (ref 4.0–10.5)

## 2018-08-08 LAB — TSH: TSH: 2.3 u[IU]/mL (ref 0.35–4.50)

## 2018-08-08 LAB — VITAMIN D 25 HYDROXY (VIT D DEFICIENCY, FRACTURES): VITD: 49.5 ng/mL (ref 30.00–100.00)

## 2018-08-08 MED ORDER — FAMOTIDINE 20 MG PO TABS: 20.0000 mg | ORAL_TABLET | Freq: Two times a day (BID) | ORAL | 5 refills | Status: DC | PRN

## 2018-08-08 NOTE — Assessment & Plan Note (Signed)
Well controlled, no changes to meds. Encouraged heart healthy diet such as the DASH diet and exercise as tolerated.  °

## 2018-08-08 NOTE — Assessment & Plan Note (Signed)
Supplement and monitor 

## 2018-08-08 NOTE — Assessment & Plan Note (Signed)
Patient encouraged to maintain heart healthy diet, regular exercise, adequate sleep. Consider daily probiotics. Take medications as prescribed. Given and reviewed copy of ACP documents from [place] Secretary of State and encouraged to complete and return 

## 2018-08-08 NOTE — Patient Instructions (Addendum)
Bone density shows osteopenia, which is thinner than normal but not as bad as osteoporosis. Recommend calcium intake of 1200 to 1500 mg daily, divided into roughly 3 doses. Best source is the diet and a single dairy serving is about 500 mg, a supplement of calcium citrate once or twice daily to balance diet is fine if not getting enough in diet. Also need Vitamin D 2000 IU caps, 1 cap daily if not already taking vitamin D. Also recommend weight baring exercise on hips and upper body to keep bones strong  Shingrix is the new shingles shot, 2 shots over 2-6 months at [place] 73 Years and Older, Female Preventive care refers to lifestyle choices and visits with your health care provider that can promote health and wellness. What does preventive care include?  A yearly physical exam. This is also called an annual well check.  Dental exams once or twice a year.  Routine eye exams. Ask your health care provider how often you should have your eyes checked.  Personal lifestyle choices, including: ? Daily care of your teeth and gums. ? Regular physical activity. ? Eating a healthy diet. ? Avoiding tobacco and drug use. ? Limiting alcohol use. ? Practicing safe sex. ? Taking low-dose aspirin every day. ? Taking vitamin and mineral supplements as recommended by your health care provider. What happens during an annual well check? The services and screenings done by your health care provider during your annual well check will depend on your age, overall health, lifestyle risk factors, and family history of disease. Counseling Your health care provider may ask you questions about your:  Alcohol use.  Tobacco use.  Drug use.  Emotional well-being.  Home and relationship well-being.  Sexual activity.  Eating habits.  History of falls.  Memory and ability to understand (cognition).  Work and work Statistician.  Reproductive health.  Screening You may have the  following tests or measurements:  Height, weight, and BMI.  Blood pressure.  Lipid and cholesterol levels. These may be checked every 5 years, or more frequently if you are over 20 years old.  Skin check.  Lung cancer screening. You may have this screening every year starting at age 80 if you have a 30-pack-year history of smoking and currently smoke or have quit within the past 15 years.  Fecal occult blood test (FOBT) of the stool. You may have this test every year starting at age 42.  Flexible sigmoidoscopy or colonoscopy. You may have a sigmoidoscopy every 5 years or a colonoscopy every 10 years starting at age 52.  Hepatitis C blood test.  Hepatitis B blood test.  Sexually transmitted disease (STD) testing.  Diabetes screening. This is done by checking your blood sugar (glucose) after you have not eaten for a while (fasting). You may have this done every 1-3 years.  Bone density scan. This is done to screen for osteoporosis. You may have this done starting at age 37.  Mammogram. This may be done every 1-2 years. Talk to your health care provider about how often you should have regular mammograms.  Talk with your health care provider about your test results, treatment options, and if necessary, the need for more tests. Vaccines Your health care provider may recommend certain vaccines, such as:  Influenza vaccine. This is recommended every year.  Tetanus, diphtheria, and acellular pertussis (Tdap, Td) vaccine. You may need a Td booster every 10 years.  Varicella vaccine. You may need this if you have not  been vaccinated.  Zoster vaccine. You may need this after age 21.  Measles, mumps, and rubella (MMR) vaccine. You may need at least one dose of MMR if you were born in 1957 or later. You may also need a second dose.  Pneumococcal 13-valent conjugate (PCV13) vaccine. One dose is recommended after age 20.  Pneumococcal polysaccharide (PPSV23) vaccine. One dose is  recommended after age 7.  Meningococcal vaccine. You may need this if you have certain conditions.  Hepatitis A vaccine. You may need this if you have certain conditions or if you travel or work in places where you may be exposed to hepatitis A.  Hepatitis B vaccine. You may need this if you have certain conditions or if you travel or work in places where you may be exposed to hepatitis B.  Haemophilus influenzae type b (Hib) vaccine. You may need this if you have certain conditions.  Talk to your health care provider about which screenings and vaccines you need and how often you need them. This information is not intended to replace advice given to you by your health care provider. Make sure you discuss any questions you have with your health care provider. Document Released: 10/29/2015 Document Revised: 06/21/2016 Document Reviewed: 08/03/2015 Elsevier Interactive Patient Education  [employer].

## 2018-08-08 NOTE — Assessment & Plan Note (Signed)
Improved and not keeping her up at night. Encouraged to hydrate and report if worsens.

## 2018-08-08 NOTE — Assessment & Plan Note (Signed)
Encouraged heart healthy diet, increase exercise, avoid trans fats, consider a krill oil cap daily 

## 2018-08-08 NOTE — Progress Notes (Signed)
Subjective:    Patient ID: Mandy [name], female    DOB: 1946/01/31, 72 y.o.   MRN: [id number]  No chief complaint on file.   HPI Patient is in today for follow-up and annual preventative exam.  No recent febrile illness or hospitalizations.  She is noting that her cough continues to wax and wane.  It is greatly improved and nonproductive.  Happens most less frequently but still does happen with a tickle every now and again.  Does not awaken her from sleep.  It recently flared during a spell of some sneezing as well and she believes it was allergic and is now improving.  She has had to undergo a procedure for some hemorrhoids recently and has had a good result.  She follows with a proctologist in [place] by Dr. Lyla Son.  Since that her bowels have moved comfortably with no bloody or tarry appearance.  No acute concerns otherwise noted.  She is eating well and exercises regularly.  Manages her activities of daily living well. Denies CP/palp/SOB/HA/congestion/fevers/GI or GU c/o. Taking meds as prescribed  Past Medical History:  Diagnosis Date  . Atrophic vaginitis 11/18/2012  . Chicken pox as a child  . Cough 07/14/2016  . Dermatitis 04/26/2011  . GERD (gastroesophageal reflux disease) 03/20/2017  . HTN (hypertension) 04/13/2010   Qualifier: Diagnosis of  By: Charlett Blake MD, Erline Levine    . Measles as a child  . Miscarriage 1981  . Onychomycosis 04/26/2011  . OSA (obstructive sleep apnea) 10/30/2017  . Osteopenia 10/17/1999  . Osteopenia 04/13/2010   Qualifier: Diagnosis of  By: Charlett Blake MD, Erline Levine    . Preventative health care 07/14/2016  . Screening for malignant neoplasm of the cervix 04/26/2011  . Tinea pedis 05/10/2014  . Urinary frequency 03/20/2017    Past Surgical History:  Procedure Laterality Date  . DILATION AND CURETTAGE OF UTERUS  1981   miscarriage    Family History  Problem Relation Age of Onset  . Osteoporosis Mother   . Coronary artery disease Mother   . Heart attack Mother 76  .  Hypertension Mother   . Dementia Mother   . Diabetes Father        type 2  . Heart disease Father   . Stroke Father   . Dementia Maternal Grandmother   . Asthma Maternal Grandfather   . Cancer Paternal Grandmother        ovarian  . Dementia Paternal Grandmother   . Diabetes Paternal Grandfather        tyoe 2  . Hearing loss Paternal Grandfather   . Dementia Brother        Lewy Body  . Arthritis Other   . Hyperlipidemia Other   . Hypertension Other   . Other Other        Cardiovascular disorder    Social History   Socioeconomic History  . Marital status: Married    Spouse name: Not on file  . Number of children: Not on file  . Years of education: Not on file  . Highest education level: Not on file  Occupational History  . Occupation: bookkeeper  Scientific laboratory technician  . Financial resource strain: Not on file  . Food insecurity:    Worry: Not on file    Inability: Not on file  . Transportation needs:    Medical: Not on file    Non-medical: Not on file  Tobacco Use  . Smoking status: Former Smoker    Packs/day: 1.00  Years: 40.00    Pack years: 40.00    Types: Cigarettes    Last attempt to quit: 10/16/2000    Years since quitting: 17.8  . Smokeless tobacco: Never Used  Substance and Sexual Activity  . Alcohol use: No  . Drug use: No  . Sexual activity: Yes    Partners: Male  Lifestyle  . Physical activity:    Days per week: Not on file    Minutes per session: Not on file  . Stress: Not on file  Relationships  . Social connections:    Talks on phone: Not on file    Gets together: Not on file    Attends religious service: Not on file    Active member of club or organization: Not on file    Attends meetings of clubs or organizations: Not on file    Relationship status: Not on file  . Intimate partner violence:    Fear of current or ex partner: Not on file    Emotionally abused: Not on file    Physically abused: Not on file    Forced sexual activity: Not on  file  Other Topics Concern  . Not on file  Social History Narrative   Regular exercise- yes   Does Yoga 3-4 X week, personal trainer 1 X week, and walks regularly   Has lost 16 pounds by minimizing processed foods and meats    Outpatient Medications Prior to Visit  Medication Sig Dispense Refill  . aspirin 81 MG tablet Take 81 mg by mouth daily.    Marland Kitchen augmented betamethasone dipropionate (DIPROLENE-AF) 0.05 % ointment Apply topically 2 (two) times daily.    Marland Kitchen b complex vitamins tablet Take 1 tablet by mouth daily.    . calcium citrate-vitamin D (CITRACAL+D) 315-200 MG-UNIT per tablet Take 1 tablet by mouth 2 (two) times daily.      . Loperamide HCl (IMODIUM PO) Take by mouth.    . Methylsulfonylmethane (MSM PO) Take by mouth.    . multivitamin (THERAGRAN) per tablet Take 1 tablet by mouth daily.      Marland Kitchen nystatin cream (MYCOSTATIN) Apply 1 application topically 2 (two) times daily. 30 g 1  . Omega-3 Fatty Acids-Vitamin E (COROMEGA PO) Take by mouth.    Marland Kitchen PREMARIN vaginal cream APPLY A SMALL AMOUNT PER VAGINA 3 TIMES A WEEK AT BEDTIME 30 g 0  . Probiotic Product (PROBIOTIC DAILY PO) Take by mouth daily.    . ranitidine (ZANTAC) 75 MG tablet Take 75 mg by mouth 2 (two) times daily.    Marland Kitchen triamterene-hydrochlorothiazide (DYAZIDE) 37.5-25 MG capsule TAKE (1) CAPSULE DAILY. 30 capsule 3  . TURMERIC PO Take by mouth daily.    . vitamin C (ASCORBIC ACID) 500 MG tablet Take 500 mg by mouth daily.    . Zinc Acetate, Oral, (ZINC ACETATE PO) Take by mouth.    Marland Kitchen ZINC CHELATED PO Take by mouth.    . mupirocin nasal ointment (BACTROBAN) 2 % Apply to nares via Qtip every night at bedtime x 10 days. 10 g 0   No facility-administered medications prior to visit.     Allergies  Allergen Reactions  . Codeine   . Pneumococcal Vaccine Polyvalent     REACTION: rash at injection site and induration.    Review of Systems  Constitutional: Negative for chills, fever and malaise/fatigue.  HENT: Negative  for congestion and hearing loss.   Eyes: Negative for discharge.  Respiratory: Positive for cough. Negative for sputum production  and shortness of breath.   Cardiovascular: Negative for chest pain, palpitations and leg swelling.  Gastrointestinal: Negative for abdominal pain, blood in stool, constipation, diarrhea, heartburn, nausea and vomiting.  Genitourinary: Negative for dysuria, frequency, hematuria and urgency.  Musculoskeletal: Negative for back pain, falls and myalgias.  Skin: Negative for rash.  Neurological: Negative for dizziness, sensory change, loss of consciousness, weakness and headaches.  Endo/Heme/Allergies: Negative for environmental allergies. Does not bruise/bleed easily.  Psychiatric/Behavioral: Negative for depression and suicidal ideas. The patient is not nervous/anxious and does not have insomnia.        Objective:    Physical Exam  Constitutional: She is oriented to person, place, and time. She appears well-developed and well-nourished. No distress.  HENT:  Head: Normocephalic and atraumatic.  Eyes: Conjunctivae are normal.  Neck: Neck supple. No thyromegaly present.  Cardiovascular: Normal rate, regular rhythm and normal heart sounds.  No murmur heard. Pulmonary/Chest: Effort normal and breath sounds normal. No respiratory distress.  Abdominal: Soft. Bowel sounds are normal. She exhibits no distension and no mass. There is no tenderness.  Musculoskeletal: She exhibits no edema.  Lymphadenopathy:    She has no cervical adenopathy.  Neurological: She is alert and oriented to person, place, and time.  Skin: Skin is warm and dry.  Psychiatric: She has a normal mood and affect. Her behavior is normal.    BP 124/70 (BP Location: Left Arm, Patient Position: Sitting, Cuff Size: Normal)   Pulse (!) 58   Temp 97.6 F (36.4 C) (Oral)   Resp 18   Ht 5\' 2"  (1.575 m)   Wt 172 lb 12.8 oz (78.4 kg)   SpO2 95%   BMI 31.61 kg/m  Wt Readings from Last 3 Encounters:    08/08/18 172 lb 12.8 oz (78.4 kg)  02/06/18 165 lb (74.8 kg)  02/05/18 165 lb 9.6 oz (75.1 kg)     Lab Results  Component Value Date   WBC 4.5 07/26/2017   HGB 13.9 07/26/2017   HCT 42.6 07/26/2017   PLT 329.0 07/26/2017   GLUCOSE 83 08/29/2017   CHOL 235 (H) 07/26/2017   TRIG 76.0 07/26/2017   HDL 58.50 07/26/2017   LDLDIRECT 113.4 11/12/2012   LDLCALC 162 (H) 07/26/2017   ALT 10 08/29/2017   AST 19 08/29/2017   NA 131 (L) 08/29/2017   K 4.6 08/29/2017   CL 94 (L) 08/29/2017   CREATININE 0.90 08/29/2017   BUN 18 08/29/2017   CO2 32 08/29/2017   TSH 3.03 07/26/2017    Lab Results  Component Value Date   TSH 3.03 07/26/2017   Lab Results  Component Value Date   WBC 4.5 07/26/2017   HGB 13.9 07/26/2017   HCT 42.6 07/26/2017   MCV 99.1 07/26/2017   PLT 329.0 07/26/2017   Lab Results  Component Value Date   NA 131 (L) 08/29/2017   K 4.6 08/29/2017   CO2 32 08/29/2017   GLUCOSE 83 08/29/2017   BUN 18 08/29/2017   CREATININE 0.90 08/29/2017   BILITOT 0.8 08/29/2017   ALKPHOS 37 (L) 08/29/2017   AST 19 08/29/2017   ALT 10 08/29/2017   PROT 6.2 08/29/2017   ALBUMIN 3.8 08/29/2017   CALCIUM 9.4 08/29/2017   GFR 65.29 08/29/2017   Lab Results  Component Value Date   CHOL 235 (H) 07/26/2017   Lab Results  Component Value Date   HDL 58.50 07/26/2017   Lab Results  Component Value Date   LDLCALC 162 (H) 07/26/2017  Lab Results  Component Value Date   TRIG 76.0 07/26/2017   Lab Results  Component Value Date   CHOLHDL 4 07/26/2017   No results found for: HGBA1C     Assessment & Plan:   Problem List Items Addressed This Visit    Vitamin D deficiency    Supplement and monitor       Mixed hyperlipidemia    Encouraged heart healthy diet, increase exercise, avoid trans fats, consider a krill oil cap daily      HTN (hypertension)    Well controlled, no changes to meds. Encouraged heart healthy diet such as the DASH diet and exercise as  tolerated.       Preventative health care    Patient encouraged to maintain heart healthy diet, regular exercise, adequate sleep. Consider daily probiotics. Take medications as prescribed.  Given and reviewed copy of ACP documents from [place] and encouraged to complete and return      Cough    Improved and not keeping her up at night. Encouraged to hydrate and report if worsens.       GERD (gastroesophageal reflux disease)    Avoid offending foods, start probiotics. Do not eat large meals in late evening and consider raising head of bed.       Relevant Medications   ranitidine (ZANTAC) 75 MG tablet   famotidine (PEPCID) 20 MG tablet      I have discontinued Armonee P. Vicars's mupirocin nasal ointment. I am also having her start on famotidine. Additionally, I am having her maintain her multivitamin, calcium citrate-vitamin D, vitamin C, b complex vitamins, aspirin, augmented betamethasone dipropionate, Omega-3 Fatty Acids-Vitamin E (COROMEGA PO), Probiotic Product (PROBIOTIC DAILY PO), TURMERIC PO, nystatin cream, Loperamide HCl (IMODIUM PO), Methylsulfonylmethane (MSM PO), (Zinc Acetate, Oral, (ZINC ACETATE PO)), ZINC CHELATED PO, PREMARIN, triamterene-hydrochlorothiazide, and ranitidine.  Meds ordered this encounter  Medications  . famotidine (PEPCID) 20 MG tablet    Sig: Take 1 tablet (20 mg total) by mouth 2 (two) times daily as needed for heartburn or indigestion.    Dispense:  60 tablet    Refill:  5     Penni Homans, MD

## 2018-08-08 NOTE — Assessment & Plan Note (Signed)
Avoid offending foods, start probiotics. Do not eat large meals in late evening and consider raising head of bed.  

## 2018-08-09 MED ORDER — ATORVASTATIN CALCIUM 10 MG PO TABS: 10.0000 mg | ORAL_TABLET | Freq: Every day | ORAL | 5 refills | Status: DC

## 2018-08-09 NOTE — Addendum Note (Signed)
Addended by: Magdalene Molly A on: 08/09/2018 01:57 PM   Modules accepted: Orders

## 2018-08-12 NOTE — Addendum Note (Signed)
Addended by: Magdalene Molly A on: 08/12/2018 03:55 PM   Modules accepted: Orders

## 2018-08-23 DIAGNOSIS — G4733 Obstructive sleep apnea (adult) (pediatric): Secondary | ICD-10-CM | POA: Diagnosis not present

## 2018-09-04 ENCOUNTER — Other Ambulatory Visit: Payer: Self-pay | Admitting: Family Medicine

## 2018-09-10 ENCOUNTER — Other Ambulatory Visit (INDEPENDENT_AMBULATORY_CARE_PROVIDER_SITE_OTHER): Payer: Medicare HMO

## 2018-09-10 DIAGNOSIS — I10 Essential (primary) hypertension: Secondary | ICD-10-CM | POA: Diagnosis not present

## 2018-09-10 DIAGNOSIS — E782 Mixed hyperlipidemia: Secondary | ICD-10-CM | POA: Diagnosis not present

## 2018-09-10 LAB — COMPREHENSIVE METABOLIC PANEL
ALT: 11 U/L (ref 0–35)
AST: 17 U/L (ref 0–37)
Albumin: 4 g/dL (ref 3.5–5.2)
Alkaline Phosphatase: 42 U/L (ref 39–117)
BILIRUBIN TOTAL: 0.6 mg/dL (ref 0.2–1.2)
BUN: 24 mg/dL — ABNORMAL HIGH (ref 6–23)
CALCIUM: 9.4 mg/dL (ref 8.4–10.5)
CO2: 30 mEq/L (ref 19–32)
Chloride: 99 mEq/L (ref 96–112)
Creatinine, Ser: 0.9 mg/dL (ref 0.40–1.20)
GFR: 65.11 mL/min (ref >60.00)
GLUCOSE: 102 mg/dL — AB (ref 70–99)
POTASSIUM: 4.4 meq/L (ref 3.5–5.1)
Sodium: 135 mEq/L (ref 135–145)
TOTAL PROTEIN: 6.1 g/dL (ref 6.0–8.3)

## 2018-09-10 LAB — LIPID PANEL
CHOL/HDL RATIO: 3
Cholesterol: 187 mg/dL (ref 0–200)
HDL: 68.8 mg/dL (ref >39.00)
LDL Cholesterol: 106 mg/dL — ABNORMAL HIGH (ref 0–99)
NONHDL: 118.13
Triglycerides: 63 mg/dL (ref 0.0–149.0)
VLDL: 12.6 mg/dL (ref 0.0–40.0)

## 2018-09-22 DIAGNOSIS — G4733 Obstructive sleep apnea (adult) (pediatric): Secondary | ICD-10-CM | POA: Diagnosis not present

## 2018-09-24 DIAGNOSIS — G4733 Obstructive sleep apnea (adult) (pediatric): Secondary | ICD-10-CM | POA: Diagnosis not present

## 2018-10-01 ENCOUNTER — Other Ambulatory Visit: Payer: Self-pay | Admitting: Family Medicine

## 2018-10-23 DIAGNOSIS — L814 Other melanin hyperpigmentation: Secondary | ICD-10-CM | POA: Diagnosis not present

## 2018-10-23 DIAGNOSIS — D692 Other nonthrombocytopenic purpura: Secondary | ICD-10-CM | POA: Diagnosis not present

## 2018-10-23 DIAGNOSIS — D225 Melanocytic nevi of trunk: Secondary | ICD-10-CM | POA: Diagnosis not present

## 2018-10-23 DIAGNOSIS — G4733 Obstructive sleep apnea (adult) (pediatric): Secondary | ICD-10-CM | POA: Diagnosis not present

## 2018-10-23 DIAGNOSIS — R69 Illness, unspecified: Secondary | ICD-10-CM | POA: Diagnosis not present

## 2018-10-23 DIAGNOSIS — L821 Other seborrheic keratosis: Secondary | ICD-10-CM | POA: Diagnosis not present

## 2018-10-23 DIAGNOSIS — Z85828 Personal history of other malignant neoplasm of skin: Secondary | ICD-10-CM | POA: Diagnosis not present

## 2018-10-23 DIAGNOSIS — D1801 Hemangioma of skin and subcutaneous tissue: Secondary | ICD-10-CM | POA: Diagnosis not present

## 2018-10-31 ENCOUNTER — Other Ambulatory Visit: Payer: Self-pay | Admitting: Family Medicine

## 2018-11-23 DIAGNOSIS — G4733 Obstructive sleep apnea (adult) (pediatric): Secondary | ICD-10-CM | POA: Diagnosis not present

## 2018-12-02 ENCOUNTER — Other Ambulatory Visit: Payer: Self-pay | Admitting: Family Medicine

## 2018-12-03 ENCOUNTER — Other Ambulatory Visit: Payer: Self-pay | Admitting: Family Medicine

## 2018-12-03 NOTE — Telephone Encounter (Signed)
Requested medication (s) are due for refill today - patient is requesting change in Rx  Requested medication (s) are on the active medication list -yes  Future visit scheduled - yes  Last refill: today  Notes to clinic: patient is requesting a change in number of pills given- Rx sent today  Requested Prescriptions  Pending Prescriptions Disp Refills   triamterene-hydrochlorothiazide (DYAZIDE) 37.5-25 MG capsule 30 capsule 0     Cardiovascular: Diuretic Combos Passed - 12/03/2018  2:45 PM      Passed - K in normal range and within 360 days    Potassium  Date Value Ref Range Status  09/10/2018 4.4 3.5 - 5.1 mEq/L Final         Passed - Na in normal range and within 360 days    Sodium  Date Value Ref Range Status  09/10/2018 135 135 - 145 mEq/L Final         Passed - Cr in normal range and within 360 days    Creat  Date Value Ref Range Status  05/07/2014 0.77 0.50 - 1.10 mg/dL Final   Creatinine, Ser  Date Value Ref Range Status  09/10/2018 0.90 0.40 - 1.20 mg/dL Final         Passed - Ca in normal range and within 360 days    Calcium  Date Value Ref Range Status  09/10/2018 9.4 8.4 - 10.5 mg/dL Final         Passed - Last BP in normal range    BP Readings from Last 1 Encounters:  08/08/18 124/70         Passed - Valid encounter within last 6 months    Recent Outpatient Visits          3 months ago Cough   [facility] [place], Bonnita Levan, MD   1 year ago Essential hypertension   [facility] at [place], [place]   1 year ago Preventative health care   [place] at [place], MD   1 year ago Urinary urgency   [facility] at [place], Bonnita Levan, MD   1 year ago Essential hypertension   [facility] at [place], Bonnita Levan, MD      Future Appointments            In 2  months Chesley Mires, MD [facility]   In 2 months [place], [place], RN [place] at [facility], [facility]          atorvastatin (LIPITOR) 10 MG tablet 30 tablet 0    Sig: Take 1 tablet (10 mg total) by mouth daily.     Cardiovascular:  Antilipid - Statins Failed - 12/03/2018  2:45 PM      Failed - LDL in normal range and within 360 days    LDL (calc)  Date Value Ref Range Status  11/12/2012 132 (H) <100 mg/dL Final    Comment:    LDL-C is inaccurate if patient is nonfasting.   Reference Range: ---------------- Optimal:            <100 Near/Above Optimal: 100-129 Borderline High:    130-159 High:               160-189 Very High:          >=190  LDL Cholesterol  Date Value Ref Range Status  09/10/2018 106 (H) 0 - 99 mg/dL Final         Passed - Total Cholesterol in normal range and within 360 days    Cholesterol, Total  Date Value Ref Range Status  11/12/2012 205 (H) <200 mg/dL Final   Cholesterol  Date Value Ref Range Status  09/10/2018 187 0 - 200 mg/dL Final    Comment:    ATP III Classification       Desirable:  < 200 mg/dL               Borderline High:  200 - 239 mg/dL          High:  > = 240 mg/dL         Passed - HDL in normal range and within 360 days    HDL-C  Date Value Ref Range Status  11/12/2012 55 >=40 mg/dL Final   HDL  Date Value Ref Range Status  09/10/2018 68.80 >39.00 mg/dL Final         Passed - Triglycerides in normal range and within 360 days    Triglycerides  Date Value Ref Range Status  09/10/2018 63.0 0.0 - 149.0 mg/dL Final    Comment:    Normal:  <150 mg/dLBorderline High:  150 - 199 mg/dL  11/12/2012 90 <150 mg/dL Final         Passed - Patient is not pregnant      Passed - Valid encounter within last 12 months    Recent Outpatient Visits          3 months ago Cough   [facility] at [place], MD   1 year ago Essential hypertension    Archivist at [place], [place]   1 year ago Preventative health care   [place] at [place], MD   1 year ago Urinary urgency   Archivist at [place], MD   1 year ago Essential hypertension   Archivist at [place], MD      Future Appointments            In 2 months Chesley Mires, MD [facility]   In 2 months [place], [place], [place] at [facility], [facility]            Requested Prescriptions  Pending Prescriptions Disp Refills   triamterene-hydrochlorothiazide (DYAZIDE) 37.5-25 MG capsule 30 capsule 0     Cardiovascular: Diuretic Combos Passed - 12/03/2018  2:45 PM      Passed - K in normal range and within 360 days    Potassium  Date Value Ref Range Status  09/10/2018 4.4 3.5 - 5.1 mEq/L Final         Passed - Na in normal range and within 360 days    Sodium  Date Value Ref Range Status  09/10/2018 135 135 - 145 mEq/L Final         Passed - Cr in normal range and within 360 days    Creat  Date Value Ref Range Status  05/07/2014 0.77 0.50 - 1.10 mg/dL Final   Creatinine, Ser  Date Value Ref Range Status  09/10/2018 0.90 0.40 - 1.20 mg/dL Final         Passed -  Ca in normal range and within 360 days    Calcium  Date Value Ref Range Status  09/10/2018 9.4 8.4 - 10.5 mg/dL Final         Passed - Last BP in normal range    BP Readings from Last 1 Encounters:  08/08/18 124/70         Passed - Valid encounter within last 6 months    Recent Outpatient Visits          3 months ago Cough   [facility] at [place], MD   1 year ago Essential hypertension   Archivist at [place], [place]   1 year ago Preventative health care   [place] at [place], MD   1 year ago Urinary urgency   Archivist at [place], MD   1 year ago Essential hypertension   Archivist at [place], MD      Future Appointments            In 2 months Chesley Mires, MD [facility]   In 2 months [place], [place], [place] at [facility], [facility]          atorvastatin (LIPITOR) 10 MG tablet 30 tablet 0    Sig: Take 1 tablet (10 mg total) by mouth daily.     Cardiovascular:  Antilipid - Statins Failed - 12/03/2018  2:45 PM      Failed - LDL in normal range and within 360 days    LDL (calc)  Date Value Ref Range Status  11/12/2012 132 (H) <100 mg/dL Final    Comment:    LDL-C is inaccurate if patient is nonfasting.   Reference Range: ---------------- Optimal:            <100 Near/Above Optimal: 100-129 Borderline High:    130-159 High:               160-189 Very High:          >=190       LDL Cholesterol  Date Value Ref Range Status  09/10/2018 106 (H) 0 - 99 mg/dL Final         Passed - Total Cholesterol in normal range and within 360 days    Cholesterol, Total  Date Value Ref Range Status  11/12/2012 205 (H) <200 mg/dL Final   Cholesterol  Date Value Ref Range Status  09/10/2018 187 0 - 200 mg/dL Final    Comment:    ATP III Classification       Desirable:  < 200 mg/dL               Borderline High:  200 - 239 mg/dL          High:  > = 240 mg/dL         Passed - HDL in normal range and within 360 days    HDL-C  Date Value Ref Range Status  11/12/2012 55 >=40 mg/dL Final   HDL  Date Value Ref Range Status  09/10/2018 68.80 >39.00 mg/dL Final         Passed - Triglycerides in normal range and within 360 days    Triglycerides  Date Value Ref Range Status  09/10/2018 63.0 0.0 - 149.0 mg/dL Final  Comment:    Normal:  <150 mg/dLBorderline High:   150 - 199 mg/dL  11/12/2012 90 <150 mg/dL Final         Passed - Patient is not pregnant      Passed - Valid encounter within last 12 months    Recent Outpatient Visits          3 months ago Cough   [facility] at [place], MD   1 year ago Essential hypertension   Archivist at [place], [place]   1 year ago Preventative health care   [place] at [place], MD   1 year ago Urinary urgency   Archivist at [place], MD   1 year ago Essential hypertension   Archivist at [place], MD      Future Appointments            In 2 months Chesley Mires, MD [place]   In 2 months V[place]elyn Royals, [place]ia [place], [place] at [facility], [place]

## 2018-12-03 NOTE — Telephone Encounter (Signed)
Copied from [place] [id number]. Topic: Quick Communication - Rx Refill/Question >> Dec 03, 2018  2:34 PM [place], [place] D wrote: Medication: triamterene-hydrochlorothiazide (DYAZIDE) 37.5-25 MG capsule / atorvastatin (LIPITOR) 10 MG tablet / Pharmacy stated they received refills but would like to request 90 day supply of rx's for patient so she can be compliant. Please advise.  Has the patient contacted their pharmacy? Yes.   (Agent: If no, request that the patient contact the pharmacy for the refill.) (Agent: If yes, when and what did the pharmacy advise?)  Preferred Pharmacy (with phone number or street name): [place], [place]. [phone] (Phone) [phone] (Fax)  Agent: Please be advised that RX refills may take up to 3 business days. We ask that you follow-up with your pharmacy.

## 2018-12-04 MED ORDER — ATORVASTATIN CALCIUM 10 MG PO TABS: 10.0000 mg | ORAL_TABLET | Freq: Every day | ORAL | 5 refills | Status: DC

## 2018-12-04 MED ORDER — TRIAMTERENE-HCTZ 37.5-25 MG PO CAPS: 1.0000 | ORAL_CAPSULE | Freq: Every day | ORAL | 5 refills | Status: DC

## 2018-12-24 DIAGNOSIS — G4733 Obstructive sleep apnea (adult) (pediatric): Secondary | ICD-10-CM | POA: Diagnosis not present

## 2019-02-02 ENCOUNTER — Other Ambulatory Visit: Payer: Self-pay | Admitting: Family Medicine

## 2019-02-07 ENCOUNTER — Encounter: Payer: Self-pay | Admitting: *Deleted

## 2019-02-07 ENCOUNTER — Other Ambulatory Visit: Payer: Self-pay

## 2019-02-07 ENCOUNTER — Ambulatory Visit (INDEPENDENT_AMBULATORY_CARE_PROVIDER_SITE_OTHER): Payer: Medicare HMO | Admitting: Family Medicine

## 2019-02-07 ENCOUNTER — Telehealth: Payer: Self-pay | Admitting: Family Medicine

## 2019-02-07 ENCOUNTER — Ambulatory Visit: Payer: Medicare HMO | Admitting: Pulmonary Disease

## 2019-02-07 DIAGNOSIS — N952 Postmenopausal atrophic vaginitis: Secondary | ICD-10-CM | POA: Diagnosis not present

## 2019-02-07 DIAGNOSIS — E782 Mixed hyperlipidemia: Secondary | ICD-10-CM | POA: Diagnosis not present

## 2019-02-07 DIAGNOSIS — I10 Essential (primary) hypertension: Secondary | ICD-10-CM

## 2019-02-07 DIAGNOSIS — E559 Vitamin D deficiency, unspecified: Secondary | ICD-10-CM | POA: Diagnosis not present

## 2019-02-07 NOTE — Assessment & Plan Note (Signed)
no changes to meds. Encouraged heart healthy diet such as the DASH diet and exercise as tolerated.  

## 2019-02-07 NOTE — Assessment & Plan Note (Signed)
Tolerating statin, encouraged heart healthy diet, avoid trans fats, minimize simple carbs and saturated fats. Increase exercise as tolerated 

## 2019-02-07 NOTE — Assessment & Plan Note (Signed)
Supplement and monitor 

## 2019-02-07 NOTE — Telephone Encounter (Signed)
LVM for patient to call back to schedule a return appointment in the end of July.

## 2019-02-09 NOTE — Progress Notes (Signed)
Virtual Visit via Video Note  I connected with Mandy [name] on 02/09/19 at  9:00 AM EDT by a video enabled telemedicine application and verified that I am speaking with the correct person using two identifiers.   I discussed the limitations of evaluation and management by telemedicine and the availability of in person appointments. The patient expressed understanding and agreed to proceed. Patient is set up on video visit platform by Mandy [name], CMA      Subjective:    Patient ID: Mandy [name], female    DOB: Jul 22, 1946, 73 y.o.   MRN: [id number]  Chief Complaint  Patient presents with  . Hypertension  . Hyperlipidemia  . estrogen deficiency    Needs refill of  premarin    HPI Patient is in today for follow up on chronic medical concerns including atrophic vaginitis, hypertension, hyperlipidemia and more. She feels well today, no recent febrile illness or hospitalizations. Is managing her quarantine well and is staying in most of the time. Denies CP/palp/SOB/HA/congestion/fevers/GI or GU c/o. Taking meds as prescribed  Past Medical History:  Diagnosis Date  . Atrophic vaginitis 11/18/2012  . Chicken pox as a child  . Cough 07/14/2016  . Dermatitis 04/26/2011  . GERD (gastroesophageal reflux disease) 03/20/2017  . HTN (hypertension) 04/13/2010   Qualifier: Diagnosis of  By: Mandy Blake MD, Mandy [name]    . Measles as a child  . Miscarriage 1981  . Onychomycosis 04/26/2011  . OSA (obstructive sleep apnea) 10/30/2017  . Osteopenia 10/17/1999  . Osteopenia 04/13/2010   Qualifier: Diagnosis of  By: Mandy Blake MD, Mandy [name]    . Preventative health care 07/14/2016  . Screening for malignant neoplasm of the cervix 04/26/2011  . Tinea pedis 05/10/2014  . Urinary frequency 03/20/2017    Past Surgical History:  Procedure Laterality Date  . DILATION AND CURETTAGE OF UTERUS  1981   miscarriage    Family History  Problem Relation Age of Onset  . Osteoporosis Mother   . Coronary artery disease Mother   .  Heart attack Mother 69  . Hypertension Mother   . Dementia Mother   . Diabetes Father        type 2  . Heart disease Father   . Stroke Father   . Dementia Maternal Grandmother   . Asthma Maternal Grandfather   . Cancer Paternal Grandmother        ovarian  . Dementia Paternal Grandmother   . Diabetes Paternal Grandfather        tyoe 2  . Hearing loss Paternal Grandfather   . Dementia Brother        Lewy Body  . Arthritis Other   . Hyperlipidemia Other   . Hypertension Other   . Other Other        Cardiovascular disorder    Social History   Socioeconomic History  . Marital status: Married    Spouse name: Not on file  . Number of children: Not on file  . Years of education: Not on file  . Highest education level: Not on file  Occupational History  . Occupation: bookkeeper  Scientific laboratory technician  . Financial resource strain: Not on file  . Food insecurity:    Worry: Not on file    Inability: Not on file  . Transportation needs:    Medical: Not on file    Non-medical: Not on file  Tobacco Use  . Smoking status: Former Smoker    Packs/day: 1.00    Years: 40.00  Pack years: 40.00    Types: Cigarettes    Last attempt to quit: 10/16/2000    Years since quitting: 18.3  . Smokeless tobacco: Never Used  Substance and Sexual Activity  . Alcohol use: No  . Drug use: No  . Sexual activity: Yes    Partners: Male  Lifestyle  . Physical activity:    Days per week: Not on file    Minutes per session: Not on file  . Stress: Not on file  Relationships  . Social connections:    Talks on phone: Not on file    Gets together: Not on file    Attends religious service: Not on file    Active member of club or organization: Not on file    Attends meetings of clubs or organizations: Not on file    Relationship status: Not on file  . Intimate partner violence:    Fear of current or ex partner: Not on file    Emotionally abused: Not on file    Physically abused: Not on file    Forced  sexual activity: Not on file  Other Topics Concern  . Not on file  Social History Narrative   Regular exercise- yes   Does Yoga 3-4 X week, personal trainer 1 X week, and walks regularly   Has lost 16 pounds by minimizing processed foods and meats    Outpatient Medications Prior to Visit  Medication Sig Dispense Refill  . aspirin 81 MG tablet Take 81 mg by mouth daily.    Marland Kitchen atorvastatin (LIPITOR) 10 MG tablet Take 1 tablet (10 mg total) by mouth daily. 30 tablet 5  . augmented betamethasone dipropionate (DIPROLENE-AF) 0.05 % ointment Apply topically 2 (two) times daily.    Marland Kitchen b complex vitamins tablet Take 1 tablet by mouth daily.    . calcium citrate-vitamin D (CITRACAL+D) 315-200 MG-UNIT per tablet Take 1 tablet by mouth 2 (two) times daily.      . famotidine (PEPCID) 20 MG tablet Take 1 tablet (20 mg total) by mouth 2 (two) times daily as needed for heartburn or indigestion. 60 tablet 5  . Loperamide HCl (IMODIUM PO) Take 1 tablet by mouth daily.     . Methylsulfonylmethane (MSM PO) Take 2 capsules by mouth daily.     . multivitamin (THERAGRAN) per tablet Take 1 tablet by mouth daily.      . Omega-3 Fatty Acids-Vitamin E (COROMEGA PO) Take by mouth.    Marland Kitchen PREMARIN vaginal cream APPLY A SMALL AMOUNT (APPROXIMATELY 0.25G)PER VAGINA 3 TIMES A WEEK AT BEDTIME. 30 g 0  . Probiotic Product (PROBIOTIC DAILY PO) Take by mouth daily.    Marland Kitchen triamterene-hydrochlorothiazide (DYAZIDE) 37.5-25 MG capsule Take 1 each (1 capsule total) by mouth daily. 30 capsule 5  . TURMERIC PO Take 1 capsule by mouth daily.     . vitamin C (ASCORBIC ACID) 500 MG tablet Take 500 mg by mouth daily.    . Zinc Acetate, Oral, (ZINC ACETATE PO) Take by mouth.    . nystatin cream (MYCOSTATIN) Apply 1 application topically 2 (two) times daily. (Patient not taking: Reported on 02/07/2019) 30 g 1  . ranitidine (ZANTAC) 75 MG tablet Take 75 mg by mouth 2 (two) times daily.    Marland Kitchen ZINC CHELATED PO Take by mouth.     No  facility-administered medications prior to visit.     Allergies  Allergen Reactions  . Codeine   . Pneumococcal Vaccine Polyvalent     REACTION: rash  at injection site and induration.    Review of Systems  Constitutional: Negative for fever and malaise/fatigue.  HENT: Negative for congestion.   Eyes: Negative for blurred vision.  Respiratory: Negative for shortness of breath.   Cardiovascular: Negative for chest pain, palpitations and leg swelling.  Gastrointestinal: Negative for abdominal pain, blood in stool and nausea.  Genitourinary: Negative for dysuria and frequency.  Musculoskeletal: Negative for falls.  Skin: Negative for rash.  Neurological: Negative for dizziness, loss of consciousness and headaches.  Endo/Heme/Allergies: Negative for environmental allergies.  Psychiatric/Behavioral: Negative for depression. The patient is not nervous/anxious.        Objective:    Physical Exam Constitutional:      Appearance: Normal appearance. She is not ill-appearing.  HENT:     Head: Normocephalic and atraumatic.     Nose: Nose normal.  Pulmonary:     Effort: Pulmonary effort is normal.  Neurological:     Mental Status: She is alert and oriented to person, place, and time.  Psychiatric:        Mood and Affect: Mood normal.        Behavior: Behavior normal.     BP 129/85 Comment: self reported  Pulse 60 Comment: self reported  Wt 159 lb (72.1 kg) Comment: self reported  BMI 29.08 kg/m  Wt Readings from Last 3 Encounters:  02/07/19 159 lb (72.1 kg)  08/08/18 172 lb 12.8 oz (78.4 kg)  02/06/18 165 lb (74.8 kg)    Diabetic Foot Exam - Simple   No data filed     Lab Results  Component Value Date   WBC 4.0 08/08/2018   HGB 14.5 08/08/2018   HCT 43.0 08/08/2018   PLT 328.0 08/08/2018   GLUCOSE 102 (H) 09/10/2018   CHOL 187 09/10/2018   TRIG 63.0 09/10/2018   HDL 68.80 09/10/2018   LDLDIRECT 113.4 11/12/2012   LDLCALC 106 (H) 09/10/2018   ALT 11 09/10/2018    AST 17 09/10/2018   NA 135 09/10/2018   K 4.4 09/10/2018   CL 99 09/10/2018   CREATININE 0.90 09/10/2018   BUN 24 (H) 09/10/2018   CO2 30 09/10/2018   TSH 2.30 08/08/2018    Lab Results  Component Value Date   TSH 2.30 08/08/2018   Lab Results  Component Value Date   WBC 4.0 08/08/2018   HGB 14.5 08/08/2018   HCT 43.0 08/08/2018   MCV 97.9 08/08/2018   PLT 328.0 08/08/2018   Lab Results  Component Value Date   NA 135 09/10/2018   K 4.4 09/10/2018   CO2 30 09/10/2018   GLUCOSE 102 (H) 09/10/2018   BUN 24 (H) 09/10/2018   CREATININE 0.90 09/10/2018   BILITOT 0.6 09/10/2018   ALKPHOS 42 09/10/2018   AST 17 09/10/2018   ALT 11 09/10/2018   PROT 6.1 09/10/2018   ALBUMIN 4.0 09/10/2018   CALCIUM 9.4 09/10/2018   GFR 65.11 09/10/2018   Lab Results  Component Value Date   CHOL 187 09/10/2018   Lab Results  Component Value Date   HDL 68.80 09/10/2018   Lab Results  Component Value Date   LDLCALC 106 (H) 09/10/2018   Lab Results  Component Value Date   TRIG 63.0 09/10/2018   Lab Results  Component Value Date   CHOLHDL 3 09/10/2018   No results found for: HGBA1C     Assessment & Plan:   Problem List Items Addressed This Visit    Vitamin D deficiency    Supplement  and monitor      Mixed hyperlipidemia    Tolerating statin, encouraged heart healthy diet, avoid trans fats, minimize simple carbs and saturated fats. Increase exercise as tolerated      HTN (hypertension)    no changes to meds. Encouraged heart healthy diet such as the DASH diet and exercise as tolerated.       Atrophic vaginitis    Doing well but is given a refill on Premarin cream which she uses intermittently with good results.         I have discontinued Helmi P. Dilling's nystatin cream, ZINC CHELATED PO, and ranitidine. I am also having her maintain her multivitamin, calcium citrate-vitamin D, vitamin C, b complex vitamins, aspirin, augmented betamethasone dipropionate, Omega-3  Fatty Acids-Vitamin E (COROMEGA PO), Probiotic Product (PROBIOTIC DAILY PO), TURMERIC PO, Loperamide HCl (IMODIUM PO), Methylsulfonylmethane (MSM PO), (Zinc Acetate, Oral, (ZINC ACETATE PO)), famotidine, triamterene-hydrochlorothiazide, atorvastatin, and Premarin.  No orders of the defined types were placed in this encounter.    Penni Homans, MD   I discussed the assessment and treatment plan with the patient. The patient was provided an opportunity to ask questions and all were answered. The patient agreed with the plan and demonstrated an understanding of the instructions.   The patient was advised to call back or seek an in-person evaluation if the symptoms worsen or if the condition fails to improve as anticipated.  I provided 25 minutes of non-face-to-face time during this encounter.   Penni Homans, MD

## 2019-02-09 NOTE — Assessment & Plan Note (Signed)
Doing well but is given a refill on Premarin cream which she uses intermittently with good results.

## 2019-02-13 ENCOUNTER — Ambulatory Visit: Payer: Medicare HMO | Admitting: *Deleted

## 2019-02-26 DIAGNOSIS — M25559 Pain in unspecified hip: Secondary | ICD-10-CM | POA: Diagnosis not present

## 2019-03-12 DIAGNOSIS — M25559 Pain in unspecified hip: Secondary | ICD-10-CM | POA: Diagnosis not present

## 2019-03-18 ENCOUNTER — Encounter: Payer: Self-pay | Admitting: Family Medicine

## 2019-03-21 NOTE — Telephone Encounter (Signed)
Please call patient to schedule a virtual visit  

## 2019-03-25 ENCOUNTER — Ambulatory Visit (HOSPITAL_BASED_OUTPATIENT_CLINIC_OR_DEPARTMENT_OTHER)
Admission: RE | Admit: 2019-03-25 | Discharge: 2019-03-25 | Disposition: A | Discharge status: Home / Self Care | Payer: Medicare HMO | Source: Ambulatory Visit | Attending: Family Medicine | Admitting: Family Medicine

## 2019-03-25 ENCOUNTER — Ambulatory Visit (INDEPENDENT_AMBULATORY_CARE_PROVIDER_SITE_OTHER): Payer: Medicare HMO | Admitting: Family Medicine

## 2019-03-25 ENCOUNTER — Other Ambulatory Visit: Payer: Self-pay

## 2019-03-25 VITALS — BP 110/50 | HR 60

## 2019-03-25 DIAGNOSIS — I10 Essential (primary) hypertension: Secondary | ICD-10-CM

## 2019-03-25 DIAGNOSIS — M5136 Other intervertebral disc degeneration, lumbar region: Secondary | ICD-10-CM | POA: Diagnosis not present

## 2019-03-25 DIAGNOSIS — M545 Low back pain, unspecified: Secondary | ICD-10-CM

## 2019-03-25 DIAGNOSIS — M25552 Pain in left hip: Secondary | ICD-10-CM | POA: Diagnosis not present

## 2019-03-25 DIAGNOSIS — M160 Bilateral primary osteoarthritis of hip: Secondary | ICD-10-CM | POA: Diagnosis not present

## 2019-03-25 DIAGNOSIS — M47816 Spondylosis without myelopathy or radiculopathy, lumbar region: Secondary | ICD-10-CM | POA: Diagnosis not present

## 2019-03-25 DIAGNOSIS — E559 Vitamin D deficiency, unspecified: Secondary | ICD-10-CM | POA: Diagnosis not present

## 2019-03-25 DIAGNOSIS — M4316 Spondylolisthesis, lumbar region: Secondary | ICD-10-CM | POA: Diagnosis not present

## 2019-03-26 ENCOUNTER — Encounter: Payer: Self-pay | Admitting: Family Medicine

## 2019-03-26 ENCOUNTER — Other Ambulatory Visit: Payer: Self-pay | Admitting: Family Medicine

## 2019-03-26 DIAGNOSIS — G8929 Other chronic pain: Secondary | ICD-10-CM

## 2019-03-26 DIAGNOSIS — G4733 Obstructive sleep apnea (adult) (pediatric): Secondary | ICD-10-CM | POA: Diagnosis not present

## 2019-03-30 DIAGNOSIS — M545 Low back pain, unspecified: Secondary | ICD-10-CM | POA: Insufficient documentation

## 2019-03-30 NOTE — Progress Notes (Signed)
Virtual Visit via Video Note  I connected with Mandy [name] on 03/24/19 at  9:00 AM EDT by a video enabled telemedicine application and verified that I am speaking with the correct person using two identifiers.  Location: Patient: home Provider: home   I discussed the limitations of evaluation and management by telemedicine and the availability of in person appointments. The patient expressed understanding and agreed to proceed. [name] was able to get the patient set up on video visit.     Subjective:    Patient ID: Mandy [name], female    DOB: 02/26/1946, 73 y.o.   MRN: [id number]  No chief complaint on file.   HPI Patient is in today for evaluation of back pain. Has been struggling with low back pain that is worse than her baseline for roughly 2 months. No fall or trauma but noting significant stiffness and pain worse in the morning and present throughout the day. She has been to [facility] and had massage, PT and massage and she notes the pain is no longer radiating down her left thigh as it was but is still notable in her low back. She usually does Yoga and this is no help. She feels better when she is sitting and lying down. Denies CP/palp/SOB/HA/congestion/fevers/GI or GU c/o. Taking meds as prescribed  Past Medical History:  Diagnosis Date  . Atrophic vaginitis 11/18/2012  . Chicken pox as a child  . Cough 07/14/2016  . Dermatitis 04/26/2011  . GERD (gastroesophageal reflux disease) 03/20/2017  . HTN (hypertension) 04/13/2010   Qualifier: Diagnosis of  By: Charlett Blake MD, Erline Levine    . Measles as a child  . Miscarriage 1981  . Onychomycosis 04/26/2011  . OSA (obstructive sleep apnea) 10/30/2017  . Osteopenia 10/17/1999  . Osteopenia 04/13/2010   Qualifier: Diagnosis of  By: Charlett Blake MD, Erline Levine    . Preventative health care 07/14/2016  . Screening for malignant neoplasm of the cervix 04/26/2011  . Tinea pedis 05/10/2014  . Urinary frequency 03/20/2017    Past Surgical History:  Procedure  Laterality Date  . DILATION AND CURETTAGE OF UTERUS  1981   miscarriage    Family History  Problem Relation Age of Onset  . Osteoporosis Mother   . Coronary artery disease Mother   . Heart attack Mother 36  . Hypertension Mother   . Dementia Mother   . Diabetes Father        type 2  . Heart disease Father   . Stroke Father   . Dementia Maternal Grandmother   . Asthma Maternal Grandfather   . Cancer Paternal Grandmother        ovarian  . Dementia Paternal Grandmother   . Diabetes Paternal Grandfather        tyoe 2  . Hearing loss Paternal Grandfather   . Dementia Brother        Lewy Body  . Arthritis Other   . Hyperlipidemia Other   . Hypertension Other   . Other Other        Cardiovascular disorder    Social History   Socioeconomic History  . Marital status: Married    Spouse name: Not on file  . Number of children: Not on file  . Years of education: Not on file  . Highest education level: Not on file  Occupational History  . Occupation: bookkeeper  Scientific laboratory technician  . Financial resource strain: Not on file  . Food insecurity    Worry: Not on file  Inability: Not on file  . Transportation needs    Medical: Not on file    Non-medical: Not on file  Tobacco Use  . Smoking status: Former Smoker    Packs/day: 1.00    Years: 40.00    Pack years: 40.00    Types: Cigarettes    Quit date: 10/16/2000    Years since quitting: 18.4  . Smokeless tobacco: Never Used  Substance and Sexual Activity  . Alcohol use: No  . Drug use: No  . Sexual activity: Yes    Partners: Male  Lifestyle  . Physical activity    Days per week: Not on file    Minutes per session: Not on file  . Stress: Not on file  Relationships  . Social Herbalist on phone: Not on file    Gets together: Not on file    Attends religious service: Not on file    Active member of club or organization: Not on file    Attends meetings of clubs or organizations: Not on file    Relationship  status: Not on file  . Intimate partner violence    Fear of current or ex partner: Not on file    Emotionally abused: Not on file    Physically abused: Not on file    Forced sexual activity: Not on file  Other Topics Concern  . Not on file  Social History Narrative   Regular exercise- yes   Does Yoga 3-4 X week, personal trainer 1 X week, and walks regularly   Has lost 16 pounds by minimizing processed foods and meats    Outpatient Medications Prior to Visit  Medication Sig Dispense Refill  . aspirin 81 MG tablet Take 81 mg by mouth daily.    Marland Kitchen atorvastatin (LIPITOR) 10 MG tablet Take 1 tablet (10 mg total) by mouth daily. 30 tablet 5  . augmented betamethasone dipropionate (DIPROLENE-AF) 0.05 % ointment Apply topically 2 (two) times daily.    Marland Kitchen b complex vitamins tablet Take 1 tablet by mouth daily.    . calcium citrate-vitamin D (CITRACAL+D) 315-200 MG-UNIT per tablet Take 1 tablet by mouth 2 (two) times daily.      . famotidine (PEPCID) 20 MG tablet Take 1 tablet (20 mg total) by mouth 2 (two) times daily as needed for heartburn or indigestion. 60 tablet 5  . Loperamide HCl (IMODIUM PO) Take 1 tablet by mouth daily.     . Methylsulfonylmethane (MSM PO) Take 2 capsules by mouth daily.     . multivitamin (THERAGRAN) per tablet Take 1 tablet by mouth daily.      . Omega-3 Fatty Acids-Vitamin E (COROMEGA PO) Take by mouth.    Marland Kitchen PREMARIN vaginal cream APPLY A SMALL AMOUNT (APPROXIMATELY 0.25G)PER VAGINA 3 TIMES A WEEK AT BEDTIME. 30 g 0  . Probiotic Product (PROBIOTIC DAILY PO) Take by mouth daily.    Marland Kitchen triamterene-hydrochlorothiazide (DYAZIDE) 37.5-25 MG capsule Take 1 each (1 capsule total) by mouth daily. 30 capsule 5  . TURMERIC PO Take 1 capsule by mouth daily.     . vitamin C (ASCORBIC ACID) 500 MG tablet Take 500 mg by mouth daily.    . Zinc Acetate, Oral, (ZINC ACETATE PO) Take by mouth.     No facility-administered medications prior to visit.     Allergies  Allergen  Reactions  . Codeine   . Pneumococcal Vaccine Polyvalent     REACTION: rash at injection site and induration.  Review of Systems  Constitutional: Negative for fever and malaise/fatigue.  HENT: Negative for congestion.   Eyes: Negative for blurred vision.  Respiratory: Negative for shortness of breath.   Cardiovascular: Negative for chest pain, palpitations and leg swelling.  Gastrointestinal: Negative for abdominal pain, blood in stool and nausea.  Genitourinary: Negative for dysuria and frequency.  Musculoskeletal: Positive for back pain and myalgias. Negative for falls.  Skin: Negative for rash.  Neurological: Negative for dizziness, loss of consciousness and headaches.  Endo/Heme/Allergies: Negative for environmental allergies.  Psychiatric/Behavioral: Negative for depression. The patient is not nervous/anxious.        Objective:    Physical Exam Constitutional:      Appearance: Normal appearance.  HENT:     Head: Normocephalic and atraumatic.  Eyes:     General:        Right eye: No discharge.        Left eye: No discharge.  Pulmonary:     Effort: Pulmonary effort is normal.  Neurological:     Mental Status: She is alert and oriented to person, place, and time.  Psychiatric:        Mood and Affect: Mood normal.        Behavior: Behavior normal.     BP (!) 110/50   Pulse 60   SpO2 97%  Wt Readings from Last 3 Encounters:  02/07/19 159 lb (72.1 kg)  08/08/18 172 lb 12.8 oz (78.4 kg)  02/06/18 165 lb (74.8 kg)    Diabetic Foot Exam - Simple   No data filed     Lab Results  Component Value Date   WBC 4.0 08/08/2018   HGB 14.5 08/08/2018   HCT 43.0 08/08/2018   PLT 328.0 08/08/2018   GLUCOSE 102 (H) 09/10/2018   CHOL 187 09/10/2018   TRIG 63.0 09/10/2018   HDL 68.80 09/10/2018   LDLDIRECT 113.4 11/12/2012   LDLCALC 106 (H) 09/10/2018   ALT 11 09/10/2018   AST 17 09/10/2018   NA 135 09/10/2018   K 4.4 09/10/2018   CL 99 09/10/2018   CREATININE  0.90 09/10/2018   BUN 24 (H) 09/10/2018   CO2 30 09/10/2018   TSH 2.30 08/08/2018    Lab Results  Component Value Date   TSH 2.30 08/08/2018   Lab Results  Component Value Date   WBC 4.0 08/08/2018   HGB 14.5 08/08/2018   HCT 43.0 08/08/2018   MCV 97.9 08/08/2018   PLT 328.0 08/08/2018   Lab Results  Component Value Date   NA 135 09/10/2018   K 4.4 09/10/2018   CO2 30 09/10/2018   GLUCOSE 102 (H) 09/10/2018   BUN 24 (H) 09/10/2018   CREATININE 0.90 09/10/2018   BILITOT 0.6 09/10/2018   ALKPHOS 42 09/10/2018   AST 17 09/10/2018   ALT 11 09/10/2018   PROT 6.1 09/10/2018   ALBUMIN 4.0 09/10/2018   CALCIUM 9.4 09/10/2018   GFR 65.11 09/10/2018   Lab Results  Component Value Date   CHOL 187 09/10/2018   Lab Results  Component Value Date   HDL 68.80 09/10/2018   Lab Results  Component Value Date   LDLCALC 106 (H) 09/10/2018   Lab Results  Component Value Date   TRIG 63.0 09/10/2018   Lab Results  Component Value Date   CHOLHDL 3 09/10/2018   No results found for: HGBA1C     Assessment & Plan:   Problem List Items Addressed This Visit    Vitamin D deficiency  Supplement and monitor      HTN (hypertension)    Well controlled, no changes to meds. Encouraged heart healthy diet such as the DASH diet and exercise as tolerated.       Low back pain   Relevant Orders   DG Lumbar Spine Complete (Completed)    Other Visit Diagnoses    Left hip pain    -  Primary   Relevant Orders   DG HIPS BILAT W OR W/O PELVIS 2V (Completed)   DG Lumbar Spine Complete (Completed)      I am having Mandy [name] maintain her multivitamin, calcium citrate-vitamin D, vitamin C, b complex vitamins, aspirin, augmented betamethasone dipropionate, Omega-3 Fatty Acids-Vitamin E (COROMEGA PO), Probiotic Product (PROBIOTIC DAILY PO), TURMERIC PO, Loperamide HCl (IMODIUM PO), Methylsulfonylmethane (MSM PO), (Zinc Acetate, Oral, (ZINC ACETATE PO)), famotidine,  triamterene-hydrochlorothiazide, atorvastatin, and Premarin.  No orders of the defined types were placed in this encounter.  I discussed the assessment and treatment plan with the patient. The patient was provided an opportunity to ask questions and all were answered. The patient agreed with the plan and demonstrated an understanding of the instructions.   The patient was advised to call back or seek an in-person evaluation if the symptoms worsen or if the condition fails to improve as anticipated.  I provided 25 minutes of non-face-to-face time during this encounter.   Penni Homans, MD

## 2019-03-30 NOTE — Assessment & Plan Note (Signed)
Supplement and monitor 

## 2019-03-30 NOTE — Assessment & Plan Note (Signed)
Has been struggling with low back pain that is worse than her baseline for roughly 2 months. No fall or trauma but noting significant stiffness and pain worse in the morning and present throughout the day. She has been to [facility] PT and had massage, PT and massage and she notes the pain is no longer radiating down her left thigh as it was but is still notable in her low back. She usually does Yoga and this is no help. She feels better when she is sitting and lying down. Will proceed with imaging and referral as she has failed conservative measures for greater than 8 weeks now. Encouraged moist heat and gentle stretching as tolerated. May try NSAIDs and prescription meds as directed and report if symptoms worsen or seek immediate care

## 2019-03-30 NOTE — Assessment & Plan Note (Signed)
Well controlled, no changes to meds. Encouraged heart healthy diet such as the DASH diet and exercise as tolerated.  °

## 2019-04-15 ENCOUNTER — Other Ambulatory Visit: Payer: Medicare HMO

## 2019-04-21 DIAGNOSIS — M545 Low back pain: Secondary | ICD-10-CM | POA: Diagnosis not present

## 2019-04-21 DIAGNOSIS — M4180 Other forms of scoliosis, site unspecified: Secondary | ICD-10-CM | POA: Diagnosis not present

## 2019-04-21 DIAGNOSIS — M5136 Other intervertebral disc degeneration, lumbar region: Secondary | ICD-10-CM | POA: Diagnosis not present

## 2019-04-24 DIAGNOSIS — H5203 Hypermetropia, bilateral: Secondary | ICD-10-CM | POA: Diagnosis not present

## 2019-04-24 DIAGNOSIS — H2513 Age-related nuclear cataract, bilateral: Secondary | ICD-10-CM | POA: Diagnosis not present

## 2019-04-29 DIAGNOSIS — R69 Illness, unspecified: Secondary | ICD-10-CM | POA: Diagnosis not present

## 2019-05-01 DIAGNOSIS — M545 Low back pain: Secondary | ICD-10-CM | POA: Diagnosis not present

## 2019-05-07 ENCOUNTER — Other Ambulatory Visit (INDEPENDENT_AMBULATORY_CARE_PROVIDER_SITE_OTHER): Payer: Medicare HMO

## 2019-05-07 ENCOUNTER — Other Ambulatory Visit: Payer: Self-pay

## 2019-05-07 DIAGNOSIS — I10 Essential (primary) hypertension: Secondary | ICD-10-CM

## 2019-05-07 DIAGNOSIS — E782 Mixed hyperlipidemia: Secondary | ICD-10-CM | POA: Diagnosis not present

## 2019-05-07 DIAGNOSIS — E559 Vitamin D deficiency, unspecified: Secondary | ICD-10-CM

## 2019-05-07 LAB — COMPREHENSIVE METABOLIC PANEL
ALT: 9 U/L (ref 0–35)
AST: 17 U/L (ref 0–37)
Albumin: 4.1 g/dL (ref 3.5–5.2)
Alkaline Phosphatase: 45 U/L (ref 39–117)
BUN: 21 mg/dL (ref 6–23)
CO2: 30 mEq/L (ref 19–32)
Calcium: 9.5 mg/dL (ref 8.4–10.5)
Chloride: 101 mEq/L (ref 96–112)
Creatinine, Ser: 0.82 mg/dL (ref 0.40–1.20)
GFR: 68.08 mL/min (ref >60.00)
Glucose, Bld: 88 mg/dL (ref 70–99)
Potassium: 4.2 mEq/L (ref 3.5–5.1)
Sodium: 137 mEq/L (ref 135–145)
Total Bilirubin: 0.8 mg/dL (ref 0.2–1.2)
Total Protein: 6.1 g/dL (ref 6.0–8.3)

## 2019-05-07 LAB — CBC
HCT: 42.2 % (ref 36.0–46.0)
Hemoglobin: 14.4 g/dL (ref 12.0–15.0)
MCHC: 34 g/dL (ref 30.0–36.0)
MCV: 97.4 fl (ref 78.0–100.0)
Platelets: 306 10*3/uL (ref 150.0–400.0)
RBC: 4.34 Mil/uL (ref 3.87–5.11)
RDW: 13 % (ref 11.5–15.5)
WBC: 4.6 10*3/uL (ref 4.0–10.5)

## 2019-05-07 LAB — TSH: TSH: 2.29 u[IU]/mL (ref 0.35–4.50)

## 2019-05-07 LAB — VITAMIN D 25 HYDROXY (VIT D DEFICIENCY, FRACTURES): VITD: 58.51 ng/mL (ref 30.00–100.00)

## 2019-05-07 LAB — LIPID PANEL
Cholesterol: 180 mg/dL (ref 0–200)
HDL: 65.1 mg/dL (ref >39.00)
LDL Cholesterol: 102 mg/dL — ABNORMAL HIGH (ref 0–99)
NonHDL: 115.29
Total CHOL/HDL Ratio: 3
Triglycerides: 65 mg/dL (ref 0.0–149.0)
VLDL: 13 mg/dL (ref 0.0–40.0)

## 2019-05-07 NOTE — Progress Notes (Signed)
Patient came in today to have her labs done per Dr Charlett Blake. Orders placed

## 2019-05-09 ENCOUNTER — Ambulatory Visit: Payer: Medicare HMO | Admitting: Family Medicine

## 2019-05-09 ENCOUNTER — Other Ambulatory Visit: Payer: Self-pay

## 2019-05-14 DIAGNOSIS — M545 Low back pain: Secondary | ICD-10-CM | POA: Diagnosis not present

## 2019-05-16 ENCOUNTER — Other Ambulatory Visit: Payer: Self-pay

## 2019-05-16 ENCOUNTER — Ambulatory Visit: Payer: Medicare HMO | Admitting: Pulmonary Disease

## 2019-05-16 ENCOUNTER — Encounter: Payer: Self-pay | Admitting: Pulmonary Disease

## 2019-05-16 VITALS — BP 118/78 | HR 67 | Temp 97.3°F | Ht 63.0 in | Wt 172.2 lb

## 2019-05-16 DIAGNOSIS — G4733 Obstructive sleep apnea (adult) (pediatric): Secondary | ICD-10-CM | POA: Diagnosis not present

## 2019-05-16 NOTE — Progress Notes (Signed)
[facility]  Chief Complaint  Patient presents with  . Follow-up    F/U re: Cpap. She reports her cpap is working great and no new cocerns voiced.     Constitutional:  BP 118/78   Pulse 67   Temp (!) 97.3 F (36.3 C) (Oral)   Ht 5\' 3"  (1.6 m)   Wt 172 lb 3.2 oz (78.1 kg)   SpO2 99%   BMI 30.50 kg/m   Past Medical History:  Osteopenia, HTN, GERD  Brief Summary:  Mandy [name] is a 73 y.o. female with obstructive sleep apnea.  She uses CPAP regularly.  Uses nasal mask.  Fits okay, but she wishes there was another option.  Not having dry mouth, sore throat, sinus congestion, or aerophagia.  Sleeps about 6 hours per night. Feels rested during the day.   Physical Exam:   Appearance - well kempt   ENMT - clear nasal mucosa, midline nasal  septum, no oral exudates, no LAN, trachea midline  Respiratory - normal chest wall, normal respiratory effort, no accessory muscle use, no wheeze/rales  CV - s1s2 regular rate and rhythm, no murmurs, no peripheral edema, radial pulses symmetric  GI - soft, non tender, no masses  Lymph - no adenopathy noted in neck and axillary areas  MSK - normal gait  Ext - no cyanosis, clubbing, or joint inflammation noted  Skin - no rashes, lesions, or ulcers  Neuro - normal strength, oriented x 3  Psych - normal mood and affect   Assessment/Plan:   Obstructive sleep apnea. - she is compliant with CPAP and reports benefit - continue auto CPAP - she will look up CPAP mask options online and call if she finds one she would like to switch to   Patient Instructions  Follow up in 1 year    Mandy Mires, MD [place] Pager: [phone] 05/16/2019, 1:18 PM  Flow Sheet    Sleep tests:  HST 10/26/17 >> AHI 17.4, SaO2 low 79% Auto CPAP 04/15/19 to 05/14/19 >> used on 19 of 30 nights with average 5 hrs 33 min.  Average AHI 0.8 with median CPAP 7 and 95 th percentile CPAP 10 cm  H2O  Medications:   Allergies as of 05/16/2019      Reactions   Codeine    Pneumococcal Vaccine Polyvalent    REACTION: rash at injection site and induration.      Medication List       Accurate as of May 16, 2019  1:18 PM. If you have any questions, ask your nurse or doctor.        aspirin 81 MG tablet Take 81 mg by mouth daily.   atorvastatin 10 MG tablet Commonly known as: LIPITOR Take 1 tablet (10 mg total) by mouth daily.   augmented betamethasone dipropionate 0.05 % ointment Commonly known as: DIPROLENE-AF Apply topically 2 (two) times daily.   b complex vitamins tablet Take 1 tablet by mouth daily.   calcium citrate-vitamin D 315-200 MG-UNIT tablet Commonly known as: CITRACAL+D Take 1 tablet by mouth 2 (two) times daily.   COROMEGA PO Take by mouth.   famotidine 20 MG tablet Commonly known as: Pepcid Take 1 tablet (20 mg total) by mouth 2 (two) times daily as needed for heartburn or indigestion.   IMODIUM PO Take 1 tablet by mouth daily.   MSM PO Take 2 capsules by mouth daily.   multivitamin per tablet Take 1 tablet by mouth daily.  Premarin vaginal cream Generic drug: conjugated estrogens APPLY A SMALL AMOUNT (APPROXIMATELY 0.25G)PER VAGINA 3 TIMES A WEEK AT BEDTIME.   PROBIOTIC DAILY PO Take by mouth daily.   triamterene-hydrochlorothiazide 37.5-25 MG capsule Commonly known as: DYAZIDE Take 1 each (1 capsule total) by mouth daily.   TURMERIC PO Take 1 capsule by mouth daily.   vitamin C 500 MG tablet Commonly known as: ASCORBIC ACID Take 500 mg by mouth daily.   ZINC ACETATE PO Take by mouth.       Past Surgical History:  She  has a past surgical history that includes Dilation and curettage of uterus (1981).  Family History:  Her family history includes Arthritis in an other family member; Asthma in her maternal grandfather; Cancer in her paternal grandmother; Coronary artery disease in her mother; Dementia in her brother,  maternal grandmother, mother, and paternal grandmother; Diabetes in her father and paternal grandfather; Hearing loss in her paternal grandfather; Heart attack (age of onset: 27) in her mother; Heart disease in her father; Hyperlipidemia in an other family member; Hypertension in her mother and another family member; Osteoporosis in her mother; Other in an other family member; Stroke in her father.  Social History:  She  reports that she quit smoking about 18 years ago. Her smoking use included cigarettes. She has a 40.00 pack-year smoking history. She has never used smokeless tobacco. She reports that she does not drink alcohol or use drugs.

## 2019-05-16 NOTE — Patient Instructions (Signed)
Follow up in 1 year.

## 2019-05-19 ENCOUNTER — Other Ambulatory Visit: Payer: Self-pay | Admitting: Family Medicine

## 2019-05-22 DIAGNOSIS — M545 Low back pain: Secondary | ICD-10-CM | POA: Diagnosis not present

## 2019-05-29 DIAGNOSIS — M545 Low back pain: Secondary | ICD-10-CM | POA: Diagnosis not present

## 2019-06-20 ENCOUNTER — Telehealth: Payer: Self-pay | Admitting: Family Medicine

## 2019-06-20 MED ORDER — SHINGRIX 50 MCG/0.5ML IM SUSR: 0.5000 mL | Freq: Once | INTRAMUSCULAR | 1 refills | Status: AC

## 2019-06-20 NOTE — Telephone Encounter (Signed)
Rx sent 

## 2019-06-20 NOTE — Telephone Encounter (Signed)
Medication Refill - Medication: shingles vaccine  Has the patient contacted their pharmacy? Yes - they need RX for this before they will administer (Agent: If no, request that the patient contact the pharmacy for the refill.) (Agent: If yes, when and what did the pharmacy advise?)  Preferred Pharmacy (with phone number or street name):  [place], [place]. [phone] (Phone) [phone] (Fax)     Agent: Please be advised that RX refills may take up to 3 business days. We ask that you follow-up with your pharmacy.

## 2019-06-27 DIAGNOSIS — G4733 Obstructive sleep apnea (adult) (pediatric): Secondary | ICD-10-CM | POA: Diagnosis not present

## 2019-07-01 ENCOUNTER — Ambulatory Visit: Payer: Medicare HMO

## 2019-07-12 ENCOUNTER — Ambulatory Visit (INDEPENDENT_AMBULATORY_CARE_PROVIDER_SITE_OTHER): Payer: Medicare HMO

## 2019-07-12 ENCOUNTER — Other Ambulatory Visit: Payer: Self-pay

## 2019-07-12 DIAGNOSIS — Z23 Encounter for immunization: Secondary | ICD-10-CM

## 2019-07-15 ENCOUNTER — Encounter: Payer: Self-pay | Admitting: Family Medicine

## 2019-07-24 ENCOUNTER — Telehealth: Payer: Self-pay | Admitting: Family Medicine

## 2019-07-24 ENCOUNTER — Other Ambulatory Visit: Payer: Self-pay | Admitting: Family Medicine

## 2019-07-24 ENCOUNTER — Encounter: Payer: Self-pay | Admitting: Family Medicine

## 2019-07-24 MED ORDER — MUPIROCIN 2 % EX OINT: 1.0000 "application " | TOPICAL_OINTMENT | Freq: Every evening | CUTANEOUS | 1 refills | Status: DC | PRN

## 2019-07-24 NOTE — Progress Notes (Unsigned)
mupi

## 2019-07-24 NOTE — Telephone Encounter (Signed)
Patient declined to schedule at this time. SF °

## 2019-08-13 ENCOUNTER — Other Ambulatory Visit: Payer: Self-pay

## 2019-08-14 ENCOUNTER — Ambulatory Visit (INDEPENDENT_AMBULATORY_CARE_PROVIDER_SITE_OTHER): Payer: Medicare HMO | Admitting: Family Medicine

## 2019-08-14 VITALS — BP 132/84 | HR 61 | Temp 97.1°F | Resp 18 | Ht 62.0 in | Wt 164.0 lb

## 2019-08-14 DIAGNOSIS — M8580 Other specified disorders of bone density and structure, unspecified site: Secondary | ICD-10-CM | POA: Diagnosis not present

## 2019-08-14 DIAGNOSIS — E782 Mixed hyperlipidemia: Secondary | ICD-10-CM | POA: Diagnosis not present

## 2019-08-14 DIAGNOSIS — I10 Essential (primary) hypertension: Secondary | ICD-10-CM

## 2019-08-14 DIAGNOSIS — E559 Vitamin D deficiency, unspecified: Secondary | ICD-10-CM

## 2019-08-14 DIAGNOSIS — Z0000 Encounter for general adult medical examination without abnormal findings: Secondary | ICD-10-CM

## 2019-08-14 DIAGNOSIS — Z1239 Encounter for other screening for malignant neoplasm of breast: Secondary | ICD-10-CM | POA: Diagnosis not present

## 2019-08-14 LAB — COMPREHENSIVE METABOLIC PANEL
ALT: 9 U/L (ref 0–35)
AST: 18 U/L (ref 0–37)
Albumin: 4.1 g/dL (ref 3.5–5.2)
Alkaline Phosphatase: 51 U/L (ref 39–117)
BUN: 23 mg/dL (ref 6–23)
CO2: 32 mEq/L (ref 19–32)
Calcium: 9.3 mg/dL (ref 8.4–10.5)
Chloride: 100 mEq/L (ref 96–112)
Creatinine, Ser: 0.82 mg/dL (ref 0.40–1.20)
GFR: 68.03 mL/min (ref >60.00)
Glucose, Bld: 95 mg/dL (ref 70–99)
Potassium: 4.4 mEq/L (ref 3.5–5.1)
Sodium: 136 mEq/L (ref 135–145)
Total Bilirubin: 0.6 mg/dL (ref 0.2–1.2)
Total Protein: 6.2 g/dL (ref 6.0–8.3)

## 2019-08-14 LAB — LIPID PANEL
Cholesterol: 173 mg/dL (ref 0–200)
HDL: 59.9 mg/dL (ref >39.00)
LDL Cholesterol: 101 mg/dL — ABNORMAL HIGH (ref 0–99)
NonHDL: 113.22
Total CHOL/HDL Ratio: 3
Triglycerides: 61 mg/dL (ref 0.0–149.0)
VLDL: 12.2 mg/dL (ref 0.0–40.0)

## 2019-08-14 LAB — CBC
HCT: 39.5 % (ref 36.0–46.0)
Hemoglobin: 13.6 g/dL (ref 12.0–15.0)
MCHC: 34.4 g/dL (ref 30.0–36.0)
MCV: 98.4 fl (ref 78.0–100.0)
Platelets: 327 10*3/uL (ref 150.0–400.0)
RBC: 4.01 Mil/uL (ref 3.87–5.11)
RDW: 13.4 % (ref 11.5–15.5)
WBC: 5.1 10*3/uL (ref 4.0–10.5)

## 2019-08-14 NOTE — Progress Notes (Signed)
Subjective:    Patient ID: Mandy [name], female    DOB: 1946-02-03, 73 y.o.   MRN: [id number]  No chief complaint on file.   HPI Patient is in today for annual preventative exam and follow up on chronic medical concerns including Hypertension, vitamin D deficiency and osteopenia. She is doing well. No recent febrile illness or hospitalizations. She is maintaining quarantine well. Is trying to maintain a heart healthy diet and to stay active. She has been struggling with low back pain and waas working with PT at [facility]. This has been helpful. She still has some back pain but it is manageable. No fall or trauma. Denies CP/palp/SOB/HA/congestion/fevers/GI or GU c/o. Taking meds as prescribed  Past Medical History:  Diagnosis Date  . Atrophic vaginitis 11/18/2012  . Chicken pox as a child  . Cough 07/14/2016  . Dermatitis 04/26/2011  . GERD (gastroesophageal reflux disease) 03/20/2017  . HTN (hypertension) 04/13/2010   Qualifier: Diagnosis of  By: Charlett Blake MD, Erline Levine    . Measles as a child  . Miscarriage 1981  . Onychomycosis 04/26/2011  . OSA (obstructive sleep apnea) 10/30/2017  . Osteopenia 10/17/1999  . Osteopenia 04/13/2010   Qualifier: Diagnosis of  By: Charlett Blake MD, Erline Levine    . Preventative health care 07/14/2016  . Screening for malignant neoplasm of the cervix 04/26/2011  . Tinea pedis 05/10/2014  . Urinary frequency 03/20/2017    Past Surgical History:  Procedure Laterality Date  . DILATION AND CURETTAGE OF UTERUS  1981   miscarriage    Family History  Problem Relation Age of Onset  . Osteoporosis Mother   . Coronary artery disease Mother   . Heart attack Mother 93  . Hypertension Mother   . Dementia Mother   . Diabetes Father        type 2  . Heart disease Father   . Stroke Father   . Dementia Maternal Grandmother   . Asthma Maternal Grandfather   . Cancer Paternal Grandmother        ovarian  . Dementia Paternal Grandmother   . Diabetes Paternal Grandfather        tyoe  2  . Hearing loss Paternal Grandfather   . Dementia Brother        Lewy Body  . Arthritis Other   . Hyperlipidemia Other   . Hypertension Other   . Other Other        Cardiovascular disorder    Social History   Socioeconomic History  . Marital status: Married    Spouse name: Not on file  . Number of children: Not on file  . Years of education: Not on file  . Highest education level: Not on file  Occupational History  . Occupation: bookkeeper  Scientific laboratory technician  . Financial resource strain: Not on file  . Food insecurity    Worry: Not on file    Inability: Not on file  . Transportation needs    Medical: Not on file    Non-medical: Not on file  Tobacco Use  . Smoking status: Former Smoker    Packs/day: 1.00    Years: 40.00    Pack years: 40.00    Types: Cigarettes    Quit date: 10/16/2000    Years since quitting: 18.8  . Smokeless tobacco: Never Used  Substance and Sexual Activity  . Alcohol use: No  . Drug use: No  . Sexual activity: Yes    Partners: Male  Lifestyle  . Physical activity  Days per week: Not on file    Minutes per session: Not on file  . Stress: Not on file  Relationships  . Social Herbalist on phone: Not on file    Gets together: Not on file    Attends religious service: Not on file    Active member of club or organization: Not on file    Attends meetings of clubs or organizations: Not on file    Relationship status: Not on file  . Intimate partner violence    Fear of current or ex partner: Not on file    Emotionally abused: Not on file    Physically abused: Not on file    Forced sexual activity: Not on file  Other Topics Concern  . Not on file  Social History Narrative   Regular exercise- yes   Does Yoga 3-4 X week, personal trainer 1 X week, and walks regularly   Has lost 16 pounds by minimizing processed foods and meats    Outpatient Medications Prior to Visit  Medication Sig Dispense Refill  . aspirin 81 MG tablet Take 81  mg by mouth daily.    Marland Kitchen atorvastatin (LIPITOR) 10 MG tablet TAKE 1 TABLET ONCE DAILY. 90 tablet 0  . augmented betamethasone dipropionate (DIPROLENE-AF) 0.05 % ointment Apply topically 2 (two) times daily.    Marland Kitchen b complex vitamins tablet Take 1 tablet by mouth daily.    . calcium citrate-vitamin D (CITRACAL+D) 315-200 MG-UNIT per tablet Take 1 tablet by mouth 2 (two) times daily.      . famotidine (PEPCID) 20 MG tablet Take 1 tablet (20 mg total) by mouth 2 (two) times daily as needed for heartburn or indigestion. 60 tablet 5  . Loperamide HCl (IMODIUM PO) Take 1 tablet by mouth daily.     . Methylsulfonylmethane (MSM PO) Take 2 capsules by mouth daily.     . multivitamin (THERAGRAN) per tablet Take 1 tablet by mouth daily.      . mupirocin ointment (BACTROBAN) 2 % Place 1 application into the nose at bedtime as needed. 22 g 1  . Omega-3 Fatty Acids-Vitamin E (COROMEGA PO) Take by mouth.    Marland Kitchen PREMARIN vaginal cream APPLY A SMALL AMOUNT (APPROXIMATELY 0.25G)PER VAGINA 3 TIMES A WEEK AT BEDTIME. 30 g 0  . Probiotic Product (PROBIOTIC DAILY PO) Take by mouth daily.    Marland Kitchen triamterene-hydrochlorothiazide (DYAZIDE) 37.5-25 MG capsule TAKE (1) CAPSULE DAILY. 90 capsule 0  . TURMERIC PO Take 1 capsule by mouth daily.     . vitamin C (ASCORBIC ACID) 500 MG tablet Take 500 mg by mouth daily.    . Zinc Acetate, Oral, (ZINC ACETATE PO) Take by mouth.     No facility-administered medications prior to visit.     Allergies  Allergen Reactions  . Codeine   . Pneumococcal Vaccine Polyvalent     REACTION: rash at injection site and induration.    Review of Systems  Constitutional: Negative for chills, fever and malaise/fatigue.  HENT: Negative for congestion and hearing loss.   Eyes: Negative for discharge.  Respiratory: Negative for cough, sputum production and shortness of breath.   Cardiovascular: Negative for chest pain, palpitations and leg swelling.  Gastrointestinal: Negative for abdominal pain,  blood in stool, constipation, diarrhea, heartburn, nausea and vomiting.  Genitourinary: Negative for dysuria, frequency, hematuria and urgency.  Musculoskeletal: Negative for back pain, falls and myalgias.  Skin: Negative for rash.  Neurological: Negative for dizziness, sensory change, loss of  consciousness, weakness and headaches.  Endo/Heme/Allergies: Negative for environmental allergies. Does not bruise/bleed easily.  Psychiatric/Behavioral: Negative for depression and suicidal ideas. The patient is not nervous/anxious and does not have insomnia.        Objective:    Physical Exam Constitutional:      General: She is not in acute distress.    Appearance: She is well-developed.  HENT:     Head: Normocephalic and atraumatic.  Eyes:     Conjunctiva/sclera: Conjunctivae normal.  Neck:     Musculoskeletal: Neck supple.     Thyroid: No thyromegaly.  Cardiovascular:     Rate and Rhythm: Normal rate and regular rhythm.     Heart sounds: Normal heart sounds. No murmur.  Pulmonary:     Effort: Pulmonary effort is normal. No respiratory distress.     Breath sounds: Normal breath sounds.  Abdominal:     General: Bowel sounds are normal. There is no distension.     Palpations: Abdomen is soft. There is no mass.     Tenderness: There is no abdominal tenderness.  Lymphadenopathy:     Cervical: No cervical adenopathy.  Skin:    General: Skin is warm and dry.  Neurological:     Mental Status: She is alert and oriented to person, place, and time.  Psychiatric:        Behavior: Behavior normal.     BP 132/84 (BP Location: Left Arm, Patient Position: Sitting, Cuff Size: Normal)   Pulse 61   Temp (!) 97.1 F (36.2 C) (Temporal)   Resp 18   Ht 5\' 2"  (1.575 m)   Wt 164 lb (74.4 kg)   SpO2 98%   BMI 30.00 kg/m  Wt Readings from Last 3 Encounters:  08/14/19 164 lb (74.4 kg)  05/16/19 172 lb 3.2 oz (78.1 kg)  02/07/19 159 lb (72.1 kg)    Diabetic Foot Exam - Simple   No data  filed     Lab Results  Component Value Date   WBC 5.1 08/14/2019   HGB 13.6 08/14/2019   HCT 39.5 08/14/2019   PLT 327.0 08/14/2019   GLUCOSE 95 08/14/2019   CHOL 173 08/14/2019   TRIG 61.0 08/14/2019   HDL 59.90 08/14/2019   LDLDIRECT 113.4 11/12/2012   LDLCALC 101 (H) 08/14/2019   ALT 9 08/14/2019   AST 18 08/14/2019   NA 136 08/14/2019   K 4.4 08/14/2019   CL 100 08/14/2019   CREATININE 0.82 08/14/2019   BUN 23 08/14/2019   CO2 32 08/14/2019   TSH 2.05 08/14/2019    Lab Results  Component Value Date   TSH 2.05 08/14/2019   Lab Results  Component Value Date   WBC 5.1 08/14/2019   HGB 13.6 08/14/2019   HCT 39.5 08/14/2019   MCV 98.4 08/14/2019   PLT 327.0 08/14/2019   Lab Results  Component Value Date   NA 136 08/14/2019   K 4.4 08/14/2019   CO2 32 08/14/2019   GLUCOSE 95 08/14/2019   BUN 23 08/14/2019   CREATININE 0.82 08/14/2019   BILITOT 0.6 08/14/2019   ALKPHOS 51 08/14/2019   AST 18 08/14/2019   ALT 9 08/14/2019   PROT 6.2 08/14/2019   ALBUMIN 4.1 08/14/2019   CALCIUM 9.3 08/14/2019   GFR 68.03 08/14/2019   Lab Results  Component Value Date   CHOL 173 08/14/2019   Lab Results  Component Value Date   HDL 59.90 08/14/2019   Lab Results  Component Value Date  [place] 101 (H) 08/14/2019   Lab Results  Component Value Date   TRIG 61.0 08/14/2019   Lab Results  Component Value Date   CHOLHDL 3 08/14/2019   No results found for: HGBA1C     Assessment & Plan:   Problem List Items Addressed This Visit    Vitamin D deficiency - Primary    Supplement and monitor      Relevant Orders   VITAMIN D 25 Hydroxy (Vit-D Deficiency, Fractures) (Completed)   Mixed hyperlipidemia    Encouraged heart healthy diet, increase exercise, avoid trans fats, consider a krill oil cap daily      Relevant Orders   Lipid panel (Completed)   Osteopenia    Encouraged to get adequate exercise, calcium and vitamin d intake. Will proceed with Dexa scan in  the spring      Relevant Orders   DG Bone Density   HTN (hypertension)    Well controlled, no changes to meds. Encouraged heart healthy diet such as the DASH diet and exercise as tolerated.       Relevant Orders   CBC (Completed)   Comprehensive metabolic panel (Completed)   TSH (Completed)   Preventative health care    Patient encouraged to maintain heart healthy diet, regular exercise, adequate sleep. Consider daily probiotics. Take medications as prescribed       Other Visit Diagnoses    Encounter for screening for malignant neoplasm of breast, unspecified screening modality       Relevant Orders   MM DIGITAL SCREENING BILATERAL      I am having Mandy [name] maintain her multivitamin, calcium citrate-vitamin D, vitamin C, b complex vitamins, aspirin, augmented betamethasone dipropionate, Omega-3 Fatty Acids-Vitamin E (COROMEGA PO), Probiotic Product (PROBIOTIC DAILY PO), TURMERIC PO, Loperamide HCl (IMODIUM PO), Methylsulfonylmethane (MSM PO), (Zinc Acetate, Oral, (ZINC ACETATE PO)), famotidine, Premarin, atorvastatin, triamterene-hydrochlorothiazide, and mupirocin ointment.  No orders of the defined types were placed in this encounter.    Penni Homans, MD

## 2019-08-14 NOTE — Assessment & Plan Note (Signed)
Patient encouraged to maintain heart healthy diet, regular exercise, adequate sleep. Consider daily probiotics. Take medications as prescribed 

## 2019-08-14 NOTE — Patient Instructions (Addendum)
Omron blood pressure cuff, upper arm   Pulse oximeter want oxygen in 90s   Multivitamins with minerals, zinc, vit c and vit d, selenium  Preventive Care 65 Years and Older, Female Preventive care refers to lifestyle choices and visits with your health care provider that can promote health and wellness. This includes:  A yearly physical exam. This is also called an annual well check.  Regular dental and eye exams.  Immunizations.  Screening for certain conditions.  Healthy lifestyle choices, such as diet and exercise. What can I expect for my preventive care visit? Physical exam Your health care provider will check:  Height and weight. These may be used to calculate body mass index (BMI), which is a measurement that tells if you are at a healthy weight.  Heart rate and blood pressure.  Your skin for abnormal spots. Counseling Your health care provider may ask you questions about:  Alcohol, tobacco, and drug use.  Emotional well-being.  Home and relationship well-being.  Sexual activity.  Eating habits.  History of falls.  Memory and ability to understand (cognition).  Work and work Statistician.  Pregnancy and menstrual history. What immunizations do I need?  Influenza (flu) vaccine  This is recommended every year. Tetanus, diphtheria, and pertussis (Tdap) vaccine  You may need a Td booster every 10 years. Varicella (chickenpox) vaccine  You may need this vaccine if you have not already been vaccinated. Zoster (shingles) vaccine  You may need this after age 74. Pneumococcal conjugate (PCV13) vaccine  One dose is recommended after age 65. Pneumococcal polysaccharide (PPSV23) vaccine  One dose is recommended after age 74. Measles, mumps, and rubella (MMR) vaccine  You may need at least one dose of MMR if you were born in 1957 or later. You may also need a second dose. Meningococcal conjugate (MenACWY) vaccine  You may need this if you have certain  conditions. Hepatitis A vaccine  You may need this if you have certain conditions or if you travel or work in places where you may be exposed to hepatitis A. Hepatitis B vaccine  You may need this if you have certain conditions or if you travel or work in places where you may be exposed to hepatitis B. Haemophilus influenzae type b (Hib) vaccine  You may need this if you have certain conditions. You may receive vaccines as individual doses or as more than one vaccine together in one shot (combination vaccines). Talk with your health care provider about the risks and benefits of combination vaccines. What tests do I need? Blood tests  Lipid and cholesterol levels. These may be checked every 5 years, or more frequently depending on your overall health.  Hepatitis C test.  Hepatitis B test. Screening  Lung cancer screening. You may have this screening every year starting at age 74 if you have a 30-pack-year history of smoking and currently smoke or have quit within the past 15 years.  Colorectal cancer screening. All adults should have this screening starting at age 74 and continuing until age 48. Your health care provider may recommend screening at age 74 if you are at increased risk. You will have tests every 1-10 years, depending on your results and the type of screening test.  Diabetes screening. This is done by checking your blood sugar (glucose) after you have not eaten for a while (fasting). You may have this done every 1-3 years.  Mammogram. This may be done every 1-2 years. Talk with your health care provider about how  often you should have regular mammograms.  BRCA-related cancer screening. This may be done if you have a family history of breast, ovarian, tubal, or peritoneal cancers. Other tests  Sexually transmitted disease (STD) testing.  Bone density scan. This is done to screen for osteoporosis. You may have this done starting at age 74. Follow these instructions at  home: Eating and drinking  Eat a diet that includes fresh fruits and vegetables, whole grains, lean protein, and low-fat dairy products. Limit your intake of foods with high amounts of sugar, saturated fats, and salt.  Take vitamin and mineral supplements as recommended by your health care provider.  Do not drink alcohol if your health care provider tells you not to drink.  If you drink alcohol: ? Limit how much you have to 0-1 drink a day. ? Be aware of how much alcohol is in your drink. In the [place] one drink equals one 12 oz bottle of beer (355 mL), one 5 oz glass of wine (148 mL), or one 1 oz glass of hard liquor (44 mL). Lifestyle  Take daily care of your teeth and gums.  Stay active. Exercise for at least 30 minutes on 5 or more days each week.  Do not use any products that contain nicotine or tobacco, such as cigarettes, e-cigarettes, and chewing tobacco. If you need help quitting, ask your health care provider.  If you are sexually active, practice safe sex. Use a condom or other form of protection in order to prevent STIs (sexually transmitted infections).  Talk with your health care provider about taking a low-dose aspirin or statin. What's next?  Go to your health care provider once a year for a well check visit.  Ask your health care provider how often you should have your eyes and teeth checked.  Stay up to date on all vaccines. This information is not intended to replace advice given to you by your health care provider. Make sure you discuss any questions you have with your health care provider. Document Released: 10/29/2015 Document Revised: 09/26/2018 Document Reviewed: 09/26/2018 Elsevier Patient Education  2020 [employer].

## 2019-08-14 NOTE — Assessment & Plan Note (Signed)
Encouraged heart healthy diet, increase exercise, avoid trans fats, consider a krill oil cap daily 

## 2019-08-14 NOTE — Assessment & Plan Note (Addendum)
Encouraged to get adequate exercise, calcium and vitamin d intake. Will proceed with Dexa scan in the spring

## 2019-08-14 NOTE — Assessment & Plan Note (Signed)
Well controlled, no changes to meds. Encouraged heart healthy diet such as the DASH diet and exercise as tolerated.  °

## 2019-08-15 LAB — TSH: TSH: 2.05 u[IU]/mL (ref 0.35–4.50)

## 2019-08-15 LAB — VITAMIN D 25 HYDROXY (VIT D DEFICIENCY, FRACTURES): VITD: 64.08 ng/mL (ref 30.00–100.00)

## 2019-08-17 NOTE — Assessment & Plan Note (Signed)
Supplement and monitor 

## 2019-08-24 ENCOUNTER — Other Ambulatory Visit: Payer: Self-pay | Admitting: Family Medicine

## 2019-09-29 DIAGNOSIS — G4733 Obstructive sleep apnea (adult) (pediatric): Secondary | ICD-10-CM | POA: Diagnosis not present

## 2019-10-21 ENCOUNTER — Encounter: Payer: Self-pay | Admitting: Family Medicine

## 2019-10-23 ENCOUNTER — Other Ambulatory Visit: Payer: Self-pay | Admitting: Family Medicine

## 2019-10-23 MED ORDER — TIZANIDINE HCL 2 MG PO TABS: 1.0000 mg | ORAL_TABLET | Freq: Every evening | ORAL | 1 refills | Status: DC | PRN

## 2019-11-16 ENCOUNTER — Ambulatory Visit: Payer: Medicare HMO

## 2019-11-17 ENCOUNTER — Telehealth: Payer: Self-pay

## 2019-11-17 NOTE — Telephone Encounter (Signed)
Pt called to see if she got left out of the rescheduling process. Her covid vaccine was canceled for 2/5. Her husband was rescheduled but she has not yet received any updates on hers. She received an email on Friday stating that she should hear back in about 48 hours to reschedule. I informed her that she should hear something today and if not to call back.   [place]

## 2019-11-21 ENCOUNTER — Ambulatory Visit: Payer: Medicare HMO

## 2019-11-21 ENCOUNTER — Ambulatory Visit: Payer: Medicare HMO | Attending: Internal Medicine

## 2019-11-21 DIAGNOSIS — Z23 Encounter for immunization: Secondary | ICD-10-CM

## 2019-11-21 NOTE — Progress Notes (Signed)
   Covid-19 Vaccination Clinic  Name:  Mandy [name]    MRN: [id number] DOB: 08/02/1946  11/21/2019  Mandy [name] was observed post Covid-19 immunization for 15 minutes without incidence. She was provided with Vaccine Information Sheet and instruction to access the V-Safe system.   Mandy [name] was instructed to call 911 with any severe reactions post vaccine: Marland Kitchen Difficulty breathing  . Swelling of your face and throat  . A fast heartbeat  . A bad rash all over your body  . Dizziness and weakness    Immunizations Administered    Name Date Dose VIS Date Route   Pfizer COVID-19 Vaccine 11/21/2019  9:29 AM 0.3 mL 09/26/2019 Intramuscular   Manufacturer: [place]   Lot: [id number]   [place]: [id number]

## 2019-12-15 ENCOUNTER — Ambulatory Visit: Payer: Medicare HMO | Attending: Internal Medicine

## 2019-12-15 DIAGNOSIS — Z23 Encounter for immunization: Secondary | ICD-10-CM | POA: Insufficient documentation

## 2019-12-15 NOTE — Progress Notes (Signed)
   Covid-19 Vaccination Clinic  Name:  Mandy [name]    MRN: [id number] DOB: 01-07-1946  12/15/2019  [name] was observed post Covid-19 immunization for 15 minutes without incidence. She was provided with Vaccine Information Sheet and instruction to access the V-Safe system.   [name] was instructed to call 911 with any severe reactions post vaccine: Marland Kitchen Difficulty breathing  . Swelling of your face and throat  . A fast heartbeat  . A bad rash all over your body  . Dizziness and weakness    Immunizations Administered    Name Date Dose VIS Date Route   Pfizer COVID-19 Vaccine 12/15/2019  4:37 PM 0.3 mL 09/26/2019 Intramuscular   Manufacturer: [place]   Lot: [id number]   [place]: [id number]

## 2019-12-16 ENCOUNTER — Ambulatory Visit: Payer: Medicare HMO

## 2019-12-26 ENCOUNTER — Encounter: Payer: Self-pay | Admitting: Family Medicine

## 2019-12-29 DIAGNOSIS — R69 Illness, unspecified: Secondary | ICD-10-CM | POA: Diagnosis not present

## 2019-12-30 DIAGNOSIS — G4733 Obstructive sleep apnea (adult) (pediatric): Secondary | ICD-10-CM | POA: Diagnosis not present

## 2020-01-22 ENCOUNTER — Encounter: Payer: Self-pay | Admitting: Family Medicine

## 2020-01-28 ENCOUNTER — Other Ambulatory Visit: Payer: Self-pay | Admitting: *Deleted

## 2020-01-28 DIAGNOSIS — E782 Mixed hyperlipidemia: Secondary | ICD-10-CM

## 2020-02-05 ENCOUNTER — Telehealth: Payer: Self-pay

## 2020-02-05 ENCOUNTER — Other Ambulatory Visit: Payer: Self-pay

## 2020-02-05 ENCOUNTER — Other Ambulatory Visit (INDEPENDENT_AMBULATORY_CARE_PROVIDER_SITE_OTHER): Payer: Medicare HMO

## 2020-02-05 DIAGNOSIS — E782 Mixed hyperlipidemia: Secondary | ICD-10-CM

## 2020-02-05 LAB — COMPREHENSIVE METABOLIC PANEL
ALT: 11 U/L (ref 0–35)
AST: 17 U/L (ref 0–37)
Albumin: 3.8 g/dL (ref 3.5–5.2)
Alkaline Phosphatase: 46 U/L (ref 39–117)
BUN: 29 mg/dL — ABNORMAL HIGH (ref 6–23)
CO2: 32 mEq/L (ref 19–32)
Calcium: 9.1 mg/dL (ref 8.4–10.5)
Chloride: 103 mEq/L (ref 96–112)
Creatinine, Ser: 0.89 mg/dL (ref 0.40–1.20)
GFR: 61.82 mL/min (ref >60.00)
Glucose, Bld: 93 mg/dL (ref 70–99)
Potassium: 4.5 mEq/L (ref 3.5–5.1)
Sodium: 139 mEq/L (ref 135–145)
Total Bilirubin: 0.5 mg/dL (ref 0.2–1.2)
Total Protein: 6 g/dL (ref 6.0–8.3)

## 2020-02-05 LAB — LIPID PANEL
Cholesterol: 246 mg/dL — ABNORMAL HIGH (ref 0–200)
HDL: 64.6 mg/dL (ref >39.00)
LDL Cholesterol: 168 mg/dL — ABNORMAL HIGH (ref 0–99)
NonHDL: 181.47
Total CHOL/HDL Ratio: 4
Triglycerides: 67 mg/dL (ref 0.0–149.0)
VLDL: 13.4 mg/dL (ref 0.0–40.0)

## 2020-02-05 MED ORDER — ROSUVASTATIN CALCIUM 5 MG PO TABS: ORAL_TABLET | ORAL | 1 refills | Status: DC

## 2020-02-05 NOTE — Telephone Encounter (Signed)
Patient called in to see if Dr. Charlett Blake could send in a prescription for  (ROSUVASTATIN mg)    Please send it to [facility], [address] [place] [address]  Phone:  [phone] Fax:  [phone]  DEA #:  --

## 2020-02-05 NOTE — Telephone Encounter (Signed)
OK start her on Rosuvastatin 5 mg with same sig as the Atorvastatin and remove Atorvastatin from chart

## 2020-02-05 NOTE — Telephone Encounter (Signed)
Looks like patient would like to take rosuvastatin instead of the atorvastatin.  Atorvastatin was noted on the lab note.

## 2020-02-05 NOTE — Telephone Encounter (Signed)
Patient notified and medication sent in.

## 2020-02-20 DIAGNOSIS — L814 Other melanin hyperpigmentation: Secondary | ICD-10-CM | POA: Diagnosis not present

## 2020-02-20 DIAGNOSIS — Z85828 Personal history of other malignant neoplasm of skin: Secondary | ICD-10-CM | POA: Diagnosis not present

## 2020-02-20 DIAGNOSIS — D225 Melanocytic nevi of trunk: Secondary | ICD-10-CM | POA: Diagnosis not present

## 2020-02-20 DIAGNOSIS — D1801 Hemangioma of skin and subcutaneous tissue: Secondary | ICD-10-CM | POA: Diagnosis not present

## 2020-02-20 DIAGNOSIS — D692 Other nonthrombocytopenic purpura: Secondary | ICD-10-CM | POA: Diagnosis not present

## 2020-02-20 DIAGNOSIS — L570 Actinic keratosis: Secondary | ICD-10-CM | POA: Diagnosis not present

## 2020-02-20 DIAGNOSIS — L821 Other seborrheic keratosis: Secondary | ICD-10-CM | POA: Diagnosis not present

## 2020-02-28 ENCOUNTER — Other Ambulatory Visit: Payer: Self-pay | Admitting: Family Medicine

## 2020-03-09 ENCOUNTER — Encounter: Payer: Self-pay | Admitting: Family Medicine

## 2020-03-09 ENCOUNTER — Other Ambulatory Visit: Payer: Self-pay | Admitting: Family Medicine

## 2020-03-09 DIAGNOSIS — Z780 Asymptomatic menopausal state: Secondary | ICD-10-CM

## 2020-03-09 DIAGNOSIS — Z789 Other specified health status: Secondary | ICD-10-CM

## 2020-03-09 DIAGNOSIS — Z1231 Encounter for screening mammogram for malignant neoplasm of breast: Secondary | ICD-10-CM

## 2020-03-09 DIAGNOSIS — M8580 Other specified disorders of bone density and structure, unspecified site: Secondary | ICD-10-CM

## 2020-03-09 DIAGNOSIS — E785 Hyperlipidemia, unspecified: Secondary | ICD-10-CM

## 2020-03-17 ENCOUNTER — Other Ambulatory Visit: Payer: Self-pay

## 2020-03-17 ENCOUNTER — Ambulatory Visit (HOSPITAL_BASED_OUTPATIENT_CLINIC_OR_DEPARTMENT_OTHER)
Admission: RE | Admit: 2020-03-17 | Discharge: 2020-03-17 | Disposition: A | Discharge status: Home / Self Care | Payer: Medicare HMO | Source: Ambulatory Visit | Attending: Family Medicine | Admitting: Family Medicine

## 2020-03-17 DIAGNOSIS — M8580 Other specified disorders of bone density and structure, unspecified site: Secondary | ICD-10-CM | POA: Diagnosis present

## 2020-03-17 DIAGNOSIS — Z1231 Encounter for screening mammogram for malignant neoplasm of breast: Secondary | ICD-10-CM | POA: Diagnosis not present

## 2020-03-17 DIAGNOSIS — M85851 Other specified disorders of bone density and structure, right thigh: Secondary | ICD-10-CM | POA: Insufficient documentation

## 2020-03-17 DIAGNOSIS — Z1239 Encounter for other screening for malignant neoplasm of breast: Secondary | ICD-10-CM

## 2020-03-25 ENCOUNTER — Ambulatory Visit: Payer: Medicare HMO | Admitting: Internal Medicine

## 2020-03-25 ENCOUNTER — Encounter: Payer: Self-pay | Admitting: Internal Medicine

## 2020-03-25 ENCOUNTER — Other Ambulatory Visit: Payer: Self-pay

## 2020-03-25 VITALS — BP 142/80 | HR 73 | Ht 62.0 in | Wt 178.0 lb

## 2020-03-25 DIAGNOSIS — I10 Essential (primary) hypertension: Secondary | ICD-10-CM

## 2020-03-25 DIAGNOSIS — T466X5A Adverse effect of antihyperlipidemic and antiarteriosclerotic drugs, initial encounter: Secondary | ICD-10-CM | POA: Diagnosis not present

## 2020-03-25 DIAGNOSIS — E782 Mixed hyperlipidemia: Secondary | ICD-10-CM

## 2020-03-25 DIAGNOSIS — M791 Myalgia, unspecified site: Secondary | ICD-10-CM

## 2020-03-25 NOTE — Patient Instructions (Signed)
Medication Instructions:  Your physician recommends that you continue on your current medications as directed. Please refer to the Current Medication list given to you today.  *If you need a refill on your cardiac medications before your next appointment, please call your pharmacy*   Lab Work: FASTING lipid panel in 3 months  If you have labs (blood work) drawn today and your tests are completely normal, you will receive your results only by: Marland Kitchen MyChart Message (if you have MyChart) OR . A paper copy in the mail If you have any lab test that is abnormal or we need to change your treatment, we will call you to review the results.   Testing/Procedures: Dr. Debara Pickett has ordered a CT coronary calcium score. This test is done at [address]. This is $150 out of pocket.   Coronary CalciumScan A coronary calcium scan is an imaging test used to look for deposits of calcium and other fatty materials (plaques) in the inner lining of the blood vessels of the heart (coronary arteries). These deposits of calcium and plaques can partly clog and narrow the coronary arteries without producing any symptoms or warning signs. This puts a person at risk for a heart attack. This test can detect these deposits before symptoms develop. Tell a health care provider about:  Any allergies you have.  All medicines you are taking, including vitamins, herbs, eye drops, creams, and over-the-counter medicines.  Any problems you or family members have had with anesthetic medicines.  Any blood disorders you have.  Any surgeries you have had.  Any medical conditions you have.  Whether you are pregnant or may be pregnant. What are the risks? Generally, this is a safe procedure. However, problems may occur, including:  Harm to a pregnant woman and her unborn baby. This test involves the use of radiation. Radiation exposure can be dangerous to a pregnant woman and her unborn baby. If you are  pregnant, you generally should not have this procedure done.  Slight increase in the risk of cancer. This is because of the radiation involved in the test. What happens before the procedure? No preparation is needed for this procedure. What happens during the procedure?  You will undress and remove any jewelry around your neck or chest.  You will put on a hospital gown.  Sticky electrodes will be placed on your chest. The electrodes will be connected to an electrocardiogram (ECG) machine to record a tracing of the electrical activity of your heart.  A CT scanner will take pictures of your heart. During this time, you will be asked to lie still and hold your breath for 2-3 seconds while a picture of your heart is being taken. The procedure may vary among health care providers and hospitals. What happens after the procedure?  You can get dressed.  You can return to your normal activities.  It is up to you to get the results of your test. Ask your health care provider, or the department that is doing the test, when your results will be ready. Summary  A coronary calcium scan is an imaging test used to look for deposits of calcium and other fatty materials (plaques) in the inner lining of the blood vessels of the heart (coronary arteries).  Generally, this is a safe procedure. Tell your health care provider if you are pregnant or may be pregnant.  No preparation is needed for this procedure.  A CT scanner will take pictures of your heart.  You can return to your normal activities after the scan is done. This information is not intended to replace advice given to you by your health care provider. Make sure you discuss any questions you have with your health care provider. Document Released: 03/30/2008 Document Revised: 08/21/2016 Document Reviewed: 08/21/2016 Elsevier Interactive Patient Education  2017 [place]: At [facility], you and your health needs are  our priority.  As part of our continuing mission to provide you with exceptional heart care, we have created designated Provider Care Teams.  These Care Teams include your primary Cardiologist (physician) and Advanced Practice Providers (APPs -  Physician Assistants and Nurse Practitioners) who all work together to provide you with the care you need, when you need it.  We recommend signing up for the patient portal called "MyChart".  Sign up information is provided on this After Visit Summary.  MyChart is used to connect with patients for Virtual Visits (Telemedicine).  Patients are able to view lab/test results, encounter notes, upcoming appointments, etc.  Non-urgent messages can be sent to your provider as well.   To learn more about what you can do with MyChart, go to [email or web address].    Your next appointment:   3 month(s) - lipid clinic  The format for your next appointment:   Either In Person or Virtual  Provider:   K. Mali Hilty, MD   Other Instructions

## 2020-03-25 NOTE — Progress Notes (Signed)
LIPID CLINIC CONSULT NOTE  Chief Complaint:  Statin intolerance  Primary Care Physician: Mandy Lukes, MD  Primary Cardiologist:  No primary care provider on file.  HPI:  Mandy [name] is a 74 y.o. female who is being seen today for the evaluation of an intolerance at the request of Mandy Lukes, MD.  A pleasant 74 year old female who was kindly referred for statin intolerance.  She has a longstanding history of high cholesterol reported her mother had high cholesterol in her father died in an MI at age 46 however he was diabetic.  She had previously had reasonable cholesterol control with a total of 173 and LDL 101 on atorvastatin however then developed severe myalgias.  She had taken it for many years prior to that.  She stopped the atorvastatin and her symptoms completely resolved.  She was then advised to go on to Crestor 5 mg twice weekly.  She had very similar symptoms on the Crestor and discontinue that.  She ultimately went to a health food store and was started on red yeast rice and coenzyme Q10.  She reports healthy diet and exercises regularly getting more than 10,000 steps every day.  Other comorbidities include hypertension which is controlled.  She does not smoke.  PMHx:  Past Medical History:  Diagnosis Date  . Atrophic vaginitis 11/18/2012  . Chicken pox as a child  . Cough 07/14/2016  . Dermatitis 04/26/2011  . GERD (gastroesophageal reflux disease) 03/20/2017  . HTN (hypertension) 04/13/2010   Qualifier: Diagnosis of  By: Mandy Blake MD, Mandy [name]    . Measles as a child  . Miscarriage 1981  . Onychomycosis 04/26/2011  . OSA (obstructive sleep apnea) 10/30/2017  . Osteopenia 10/17/1999  . Osteopenia 04/13/2010   Qualifier: Diagnosis of  By: Mandy Blake MD, Mandy [name]    . Preventative health care 07/14/2016  . Screening for malignant neoplasm of the cervix 04/26/2011  . Tinea pedis 05/10/2014  . Urinary frequency 03/20/2017    Past Surgical History:  Procedure Laterality Date  .  DILATION AND CURETTAGE OF UTERUS  1981   miscarriage    FAMHx:  Family History  Problem Relation Age of Onset  . Osteoporosis Mother   . Coronary artery disease Mother   . Heart attack Mother 69  . Hypertension Mother   . Dementia Mother   . Diabetes Father        type 2  . Heart disease Father   . Stroke Father   . Dementia Maternal Grandmother   . Asthma Maternal Grandfather   . Cancer Paternal Grandmother        ovarian  . Dementia Paternal Grandmother   . Diabetes Paternal Grandfather        tyoe 2  . Hearing loss Paternal Grandfather   . Dementia Brother        Lewy Body  . Arthritis Other   . Hyperlipidemia Other   . Hypertension Other   . Other Other        Cardiovascular disorder    SOCHx:   reports that she quit smoking about 19 years ago. Her smoking use included cigarettes. She has a 40.00 pack-year smoking history. She has never used smokeless tobacco. She reports that she does not drink alcohol and does not use drugs.  ALLERGIES:  Allergies  Allergen Reactions  . Codeine   . Pneumococcal Vaccine Polyvalent     REACTION: rash at injection site and induration.    ROS: Pertinent items noted in  HPI and remainder of comprehensive ROS otherwise negative.  HOME MEDS: Current Outpatient Medications on File Prior to Visit  Medication Sig Dispense Refill  . aspirin 81 MG tablet Take 81 mg by mouth daily.    Marland Kitchen b complex vitamins tablet Take 1 tablet by mouth daily.    . calcium citrate-vitamin D (CITRACAL+D) 315-200 MG-UNIT per tablet Take 1 tablet by mouth 2 (two) times daily.      Marland Kitchen co-enzyme Q-10 30 MG capsule Take 30 mg by mouth 3 (three) times daily.    . Loperamide HCl (IMODIUM PO) Take 1 tablet by mouth daily.     . Methylsulfonylmethane (MSM PO) Take 2 capsules by mouth daily.     . multivitamin (THERAGRAN) per tablet Take 1 tablet by mouth daily.      . mupirocin ointment (BACTROBAN) 2 % Place 1 application into the nose at bedtime as needed. 22 g 1   . Nutritional Supplements (VITAMIN D BOOSTER PO) Take by mouth.    . Omega-3 Fatty Acids-Vitamin E (COROMEGA PO) Take by mouth.    Marland Kitchen PREMARIN vaginal cream APPLY A SMALL AMOUNT (APPROXIMATELY 0.25G)PER VAGINA 3 TIMES A WEEK AT BEDTIME. 30 g 0  . Probiotic Product (PROBIOTIC DAILY PO) Take by mouth daily.    . Red Yeast Rice Extract (RED YEAST RICE PO) Take by mouth.    . triamterene-hydrochlorothiazide (DYAZIDE) 37.5-25 MG capsule TAKE (1) CAPSULE DAILY. 90 capsule 0  . TURMERIC PO Take 1 capsule by mouth daily.     . vitamin C (ASCORBIC ACID) 500 MG tablet Take 500 mg by mouth daily.    . Zinc Acetate, Oral, (ZINC ACETATE PO) Take by mouth.     No current facility-administered medications on file prior to visit.    LABS/IMAGING: No results found for this or any previous visit (from the past 48 hour(s)). No results found.  LIPID PANEL:    Component Value Date/Time   CHOL 246 (H) 02/05/2020 0713   CHOL 205 (H) 11/12/2012 0808   TRIG 67.0 02/05/2020 0713   TRIG 90 11/12/2012 0808   HDL 64.60 02/05/2020 0713   HDL 55 11/12/2012 0808   CHOLHDL 4 02/05/2020 0713   VLDL 13.4 02/05/2020 0713   LDLCALC 168 (H) 02/05/2020 0713   LDLCALC 132 (H) 11/12/2012 0808   LDLDIRECT 113.4 11/12/2012 0808    WEIGHTS: Wt Readings from Last 3 Encounters:  03/25/20 178 lb (80.7 kg)  08/14/19 164 lb (74.4 kg)  05/16/19 172 lb 3.2 oz (78.1 kg)    VITALS: BP (!) 142/80   Pulse 73   Ht 5\' 2"  (1.575 m)   Wt 178 lb (80.7 kg)   SpO2 97%   BMI 32.56 kg/m   EXAM: General appearance: alert and no distress Lungs: clear to auscultation bilaterally Heart: regular rate and rhythm Extremities: extremities normal, atraumatic, no cyanosis or edema Neurologic: Grossly normal  EKG: Deferred  ASSESSMENT: 1. Mixed dyslipidemia, low to intermediate 10-year risk by pooled cohort equation 2. Statin intolerant, myalgias  PLAN: 1.   Ms. [name] has a mixed dyslipidemia with what I believe is a low to  intermediate 10-year risk however we could better restratify her with calcium scoring.  Based on this result we can assign her to an LDL target that may not necessitate high potency statin since she has been intolerant to this.  For now I would continue the red yeast rice.  There is no evidence by recent large meta-analysis of coenzyme q10 has any  significant benefits with reduction in myalgia.  I would recommend discontinuing it.  Ultimately her target LDL may be 100 or less if her calcium score is low risk.  We may consider adding ezetimibe.  Thanks again for the kind referral.  Follow-up with me after her lipids in about 3 to 4 months.  Pixie Casino, MD, [facility] [place] Director of the Advanced Lipid Disorders &  Cardiovascular Risk Reduction Clinic Diplomate of the American Board of Clinical Lipidology Attending Cardiologist  Direct Dial: [phone]  Fax: [phone]  Website:  [email or web address] 03/25/2020, 9:13 AM

## 2020-03-26 ENCOUNTER — Encounter: Payer: Self-pay | Admitting: Adult Health

## 2020-03-26 ENCOUNTER — Ambulatory Visit: Payer: Medicare HMO | Admitting: Adult Health

## 2020-03-26 DIAGNOSIS — G4733 Obstructive sleep apnea (adult) (pediatric): Secondary | ICD-10-CM

## 2020-03-26 NOTE — Patient Instructions (Addendum)
Continue on CPAP At bedtime   Try to wear each night for at least 6hrs each night  Can try Dream wear nasal pillows  Stay active , healthy diet  Do not drive if sleepy  Follow up with Dr. Halford Chessman or Kowen Kluth NP  In 1 year and As needed

## 2020-03-26 NOTE — Assessment & Plan Note (Signed)
Moderate obstructive sleep apnea.  Encouraged patient on increased usage.  Try to get in at least 6 hours.  Patient will try the DreamWear nasal pillows.  To see if this is more comfortable for her.  Plan  Patient Instructions  Continue on CPAP At bedtime   Try to wear each night for at least 6hrs each night  Can try Dream wear nasal pillows  Stay active , healthy diet  Do not drive if sleepy  Follow up with Dr. Halford Chessman or Leiloni Smithers NP  In 1 year and As needed

## 2020-03-26 NOTE — Progress Notes (Signed)
@Patient  ID: Mandy [name], female    DOB: 1945/12/20, 74 y.o.   MRN: [id number]  Chief Complaint  Patient presents with  . Follow-up    OSA     Referring provider: Mosie Lukes, MD  HPI: 74 year old female followed for obstructive sleep apnea on nocturnal CPAP  TEST/EVENTS :  HST 10/26/17 >> AHI 17.4, SaO2 low 79% Auto CPAP 04/15/19 to 05/14/19 >> used on 19 of 30 nights with average 5 hrs 33 min.  Average AHI 0.8 with median CPAP 7 and 95 th percentile CPAP 10 cm H2O  03/26/2020 Follow up : OSA  Patient presents for a 1 year follow-up. Patient is on CPAP at bedtime for moderate obstructive sleep apnea. Patient says she is doing well with CPAP. She wears her CPAP most nights. Getting about 5 to 6 hours. Patient is on auto CPAP 5 to 15 cm H2O. Download shows 67% compliance. Daily average usage at 5.5 hours. AHI 1.3. Minimum leaks. We discussed increased usage.  Mask issues at times. Showed her different mask options, She wanted to try the dream wear nasal pillows.    Allergies  Allergen Reactions  . Codeine   . Pneumococcal Vaccine Polyvalent     REACTION: rash at injection site and induration.    Immunization History  Administered Date(s) Administered  . Fluad Quad(high Dose 65+) 07/12/2019  . Hepatitis B 04/13/2008, 06/15/2008, 12/17/2008  . Influenza Split 08/03/2011, 07/31/2012  . Influenza, High Dose Seasonal PF 07/23/2013, 07/30/2015, 07/14/2016, 07/26/2017, 07/19/2018  . Influenza,inj,Quad PF,6+ Mos 07/08/2014  . PFIZER SARS-COV-2 Vaccination 11/21/2019, 12/15/2019  . Pneumococcal Conjugate-13 10/30/2013  . Pneumococcal Polysaccharide-23 12/23/2009, 12/21/2015  . Td 05/11/2010  . Zoster 09/23/2008  . Zoster Recombinat (Shingrix) 06/27/2019    Past Medical History:  Diagnosis Date  . Atrophic vaginitis 11/18/2012  . Chicken pox as a child  . Cough 07/14/2016  . Dermatitis 04/26/2011  . GERD (gastroesophageal reflux disease) 03/20/2017  . HTN (hypertension)  04/13/2010   Qualifier: Diagnosis of  By: Charlett Blake MD, Erline Levine    . Measles as a child  . Miscarriage 1981  . Onychomycosis 04/26/2011  . OSA (obstructive sleep apnea) 10/30/2017  . Osteopenia 10/17/1999  . Osteopenia 04/13/2010   Qualifier: Diagnosis of  By: Charlett Blake MD, Erline Levine    . Preventative health care 07/14/2016  . Screening for malignant neoplasm of the cervix 04/26/2011  . Tinea pedis 05/10/2014  . Urinary frequency 03/20/2017    Tobacco History: Social History   Tobacco Use  Smoking Status Former Smoker  . Packs/day: 1.00  . Years: 40.00  . Pack years: 40.00  . Types: Cigarettes  . Quit date: 10/16/2000  . Years since quitting: 19.4  Smokeless Tobacco Never Used   Counseling given: Not Answered   Outpatient Medications Prior to Visit  Medication Sig Dispense Refill  . aspirin 81 MG tablet Take 81 mg by mouth daily.    Marland Kitchen b complex vitamins tablet Take 1 tablet by mouth daily.    . calcium citrate-vitamin D (CITRACAL+D) 315-200 MG-UNIT per tablet Take 1 tablet by mouth 2 (two) times daily.      Marland Kitchen co-enzyme Q-10 30 MG capsule Take 30 mg by mouth 3 (three) times daily.    . Loperamide HCl (IMODIUM PO) Take 1 tablet by mouth daily.     . Methylsulfonylmethane (MSM PO) Take 2 capsules by mouth daily.     . multivitamin (THERAGRAN) per tablet Take 1 tablet by mouth daily.      Marland Kitchen  mupirocin ointment (BACTROBAN) 2 % Place 1 application into the nose at bedtime as needed. 22 g 1  . Nutritional Supplements (VITAMIN D BOOSTER PO) Take by mouth.    . Omega-3 Fatty Acids-Vitamin E (COROMEGA PO) Take by mouth.    Marland Kitchen PREMARIN vaginal cream APPLY A SMALL AMOUNT (APPROXIMATELY 0.25G)PER VAGINA 3 TIMES A WEEK AT BEDTIME. 30 g 0  . Probiotic Product (PROBIOTIC DAILY PO) Take by mouth daily.    . Red Yeast Rice Extract (RED YEAST RICE PO) Take by mouth.    . triamterene-hydrochlorothiazide (DYAZIDE) 37.5-25 MG capsule TAKE (1) CAPSULE DAILY. 90 capsule 0  . TURMERIC PO Take 1 capsule by mouth daily.      . vitamin C (ASCORBIC ACID) 500 MG tablet Take 500 mg by mouth daily.    . Zinc Acetate, Oral, (ZINC ACETATE PO) Take by mouth.     No facility-administered medications prior to visit.     Review of Systems:   Constitutional:   No  weight loss, night sweats,  Fevers, chills, fatigue, or  lassitude.  HEENT:   No headaches,  Difficulty swallowing,  Tooth/dental problems, or  Sore throat,                No sneezing, itching, ear ache, nasal congestion, post nasal drip,   CV:  No chest pain,  Orthopnea, PND, swelling in lower extremities, anasarca, dizziness, palpitations, syncope.   GI  No heartburn, indigestion, abdominal pain, nausea, vomiting, diarrhea, change in bowel habits, loss of appetite, bloody stools.   Resp: No shortness of breath with exertion or at rest.  No excess mucus, no productive cough,  No non-productive cough,  No coughing up of blood.  No change in color of mucus.  No wheezing.  No chest wall deformity  Skin: no rash or lesions.  GU: no dysuria, change in color of urine, no urgency or frequency.  No flank pain, no hematuria   MS:  No joint pain or swelling.  No decreased range of motion.  No back pain.    Physical Exam  BP (!) 142/88 (BP Location: Left Arm, Cuff Size: Normal)   Pulse 67   Ht 5\' 2"  (1.575 m)   Wt 178 lb 3.2 oz (80.8 kg)   SpO2 97%   BMI 32.59 kg/m   GEN: A/Ox3; pleasant , NAD, well nourished    HEENT:  Assumption/AT,    NOSE-clear, THROAT-clear, no lesions, no postnasal drip or exudate noted. Class 2-3 MP airway .   NECK:  Supple w/ fair ROM; no JVD; normal carotid impulses w/o bruits; no thyromegaly or nodules palpated; no lymphadenopathy.    RESP  Clear  P & A; w/o, wheezes/ rales/ or rhonchi. no accessory muscle use, no dullness to percussion  CARD:  RRR, no m/r/g, no peripheral edema, pulses intact, no cyanosis or clubbing.  GI:   Soft & nt; nml bowel sounds; no organomegaly or masses detected.   Musco: Warm bil, no deformities or  joint swelling noted.   Neuro: alert, no focal deficits noted.    Skin: Warm, no lesions or rashes    Lab Results:  CBC  BNP No results found for: BNP  ProBNP No results found for: PROBNP  Imaging: DG Bone Density  Result Date: 03/17/2020 EXAM: DUAL X-RAY ABSORPTIOMETRY (DXA) FOR BONE MINERAL DENSITY IMPRESSION: STACEY A BLYTH Your patient Bridgid Printz completed a BMD test on 03/17/2020 using the Cherry Log (analysis version: 16.SP2) manufactured by [employer].  The following summarizes the results of our evaluation. [facility] PATIENT: Name: Tacia, Hindley Patient ID: [id number] Birth Date: Feb 17, 1946 Height: 62.0 in. Gender: Female Measured: 03/17/2020 Weight: 175.8 lbs. Indications: Advanced Age, Caucasian, Family Hx of Osteoporosis, History of Osteopenia, Parent hip Fx, Post Menopausal, Previous Tobacco User Fractures: Treatments: Calcium, Multivitamin, Premarin, Vitamin D ASSESSMENT: The BMD measured at Femur Neck Right is 0.806 g/cm2 with a T-score of -1.7. This patient is considered osteopenic according to [facility]) criteria. Scan quality was good. Lumbar spine was not utilized due to advanced degenerative changes. Compared with the prior study on,4/25/2018the BMD of the total mean shows no statistically signficant change. Site Region Measured Date Measured Age WHO YA BMD Classification T-score DualFemur Neck Right 03/17/2020 [age] Osteopenia -1.7 0.806 g/cm2 DualFemur Neck Right 02/07/2017 72.1 Osteopenia -1.4 0.845 g/cm2 DualFemur Total Mean 03/17/2020 75.2 Osteopenia -1.4 0.828 g/cm2 DualFemur Total Mean 02/07/2017 72.1 Osteopenia -1.3 0.838 g/cm2 Left Forearm Radius 33% 03/17/2020 75.2 Normal -1.0 0.791 g/cm2 World Health Organization [facility]) criteria for post-menopausal, Caucasian Women: Normal       T-score at or above -1 SD Osteopenia   T-score between -1 and -2.5 SD Osteoporosis T-score at or below -2.5 SD RECOMMENDATION: 1. All patients should optimize calcium  and vitamin D intake. 2. Consider FDA-approved medical therapies in postmenopausal women and men aged 107 years and older, based on the following: a. A hip or vertebral(clinical or morphometric) fracture. b. T-Score < -2.5 at the femoral neck or spine after appropriate evaluation to exclude secondary causes c. Low bone mass (T-score between -1.0 and -2.5 at the femoral neck or spine) and a 10 year probability of a hip fracture >3% or a 10 year probability of major osteoporosis-related fracture > 20% based on the [place]-adapted WHO algorithm d. Clinical judgement and/or patient preferences may indicate treatment for people with 10-year fracture probabilities above or below these levels FOLLOW-UP: Patients with diagnosis of osteoporosis or at high risk for fracture should have regular bone mineral density tests. For patients eligible for Medicare, routine testing is allowed once every 2 years. The testing frequency can be increased to one year for patients who have rapidly progressing disease, those who are receiving or discontinuing medical therapy to restore bone mass, or have additional risk factors. I have reviewed this report, anf agree with the above findings. [facility] Patient: Mandy [name] Referring Physician: Mosie Lukes Birth Date: 1946/01/12 [age] Patient ID: [id number] Height: 62.0 in. Weight: 175.8 lbs. Measured: 03/17/2020 8:48:39 AM (16 SP 4) Gender: Female Ethnicity: White Analyzed: 03/17/2020 9:12:52 AM (16 SP 4) FRAX* 10-year Probability of Fracture Based on femoral neck BMD: DualFemur (Right) Major Osteoporotic Fracture: 11.3% Hip Fracture:                2.4% Population:                  [place] (Caucasian) Risk Factors:                None *FRAX is a Materials engineer of the [employer] for Metabolic Bone Disease, a World Pharmacologist (WHO) [facility]. ASSESSMENT: The probability of a major osteoporotic fracture is 11.3% within the  next ten years. The probability of a hip fracture is 2.4% within the next ten years. Electronically Signed   By: Rolm Baptise M.D.   On: 03/17/2020 15:48   MM 3D SCREEN BREAST BILATERAL  Result  Date: 03/17/2020 CLINICAL DATA:  Screening. EXAM: DIGITAL SCREENING BILATERAL MAMMOGRAM WITH TOMO AND CAD COMPARISON:  Previous exam(s). ACR Breast Density Category a: The breast tissue is almost entirely fatty. FINDINGS: There are no findings suspicious for malignancy. Images were processed with CAD. IMPRESSION: No mammographic evidence of malignancy. A result letter of this screening mammogram will be mailed directly to the patient. RECOMMENDATION: Screening mammogram in one year. (Code:SM-B-01Y) BI-RADS CATEGORY  1: Negative. Electronically Signed   By: Margarette [place] M.D.   On: 03/17/2020 16:37      No flowsheet data found.  No results found for: NITRICOXIDE      Assessment & Plan:   OSA (obstructive sleep apnea) Moderate obstructive sleep apnea.  Encouraged patient on increased usage.  Try to get in at least 6 hours.  Patient will try the DreamWear nasal pillows.  To see if this is more comfortable for her.  Plan  Patient Instructions  Continue on CPAP At bedtime   Try to wear each night for at least 6hrs each night  Can try Dream wear nasal pillows  Stay active , healthy diet  Do not drive if sleepy  Follow up with Dr. Halford Chessman or Parveen Freehling NP  In 1 year and As needed          Rexene Edison, NP 03/26/2020

## 2020-03-26 NOTE — Progress Notes (Signed)
Reviewed and agree with assessment/plan.   Chesley Mires, MD [facility] Pulmonary/Critical Care 03/26/2020, 11:16 AM Pager:  [phone]

## 2020-03-31 ENCOUNTER — Other Ambulatory Visit: Payer: Self-pay

## 2020-03-31 ENCOUNTER — Ambulatory Visit (INDEPENDENT_AMBULATORY_CARE_PROVIDER_SITE_OTHER)
Admission: RE | Admit: 2020-03-31 | Discharge: 2020-03-31 | Disposition: A | Discharge status: Home / Self Care | Payer: Self-pay | Source: Ambulatory Visit | Attending: Internal Medicine | Admitting: Internal Medicine

## 2020-03-31 DIAGNOSIS — E782 Mixed hyperlipidemia: Secondary | ICD-10-CM

## 2020-04-01 DIAGNOSIS — G4733 Obstructive sleep apnea (adult) (pediatric): Secondary | ICD-10-CM | POA: Diagnosis not present

## 2020-04-02 ENCOUNTER — Telehealth: Payer: Self-pay | Admitting: Internal Medicine

## 2020-04-02 NOTE — Telephone Encounter (Signed)
Patient is calling a wants an explanation on what the results of her CT Scan means. She received results via MyChart but wants someone to explain what it means. Please call back

## 2020-04-02 NOTE — Telephone Encounter (Signed)
Spoke to patient advised coronary calcium score results not available yet.Advised Dr.Hilty's RN will call with results when available.

## 2020-04-06 ENCOUNTER — Other Ambulatory Visit: Payer: Self-pay | Admitting: *Deleted

## 2020-04-06 MED ORDER — EZETIMIBE 10 MG PO TABS
10.0000 mg | ORAL_TABLET | Freq: Every day | ORAL | 3 refills | Status: DC
Start: 2020-04-06 — End: 2021-03-08

## 2020-04-07 NOTE — Telephone Encounter (Signed)
Returned call to pt she states that Zetia was sent to the pharmacy yesterday with no explanation why this was sent. Informed pt that this is a cholesterol medication and went over her last lipid panel in the system and explained that her cholesterol is very high and that is why this medication was sent to the pharmacy. She was just concerned that no one told/explained this to her. She would like to know why from Dr Josepha Pigg. Please advise.

## 2020-05-05 DIAGNOSIS — H2513 Age-related nuclear cataract, bilateral: Secondary | ICD-10-CM | POA: Diagnosis not present

## 2020-05-05 DIAGNOSIS — H5203 Hypermetropia, bilateral: Secondary | ICD-10-CM | POA: Diagnosis not present

## 2020-05-07 DIAGNOSIS — Z0100 Encounter for examination of eyes and vision without abnormal findings: Secondary | ICD-10-CM | POA: Diagnosis not present

## 2020-05-18 ENCOUNTER — Other Ambulatory Visit: Payer: Self-pay | Admitting: Family Medicine

## 2020-05-23 IMAGING — DX DG CHEST 2V
2 series · 2 of 2 positions shown · non-contrast
Comparison: 09/13/2016 CXR

CLINICAL DATA: Chronic cough for a year

EXAM:
CHEST - 2 VIEW

[chest pa]
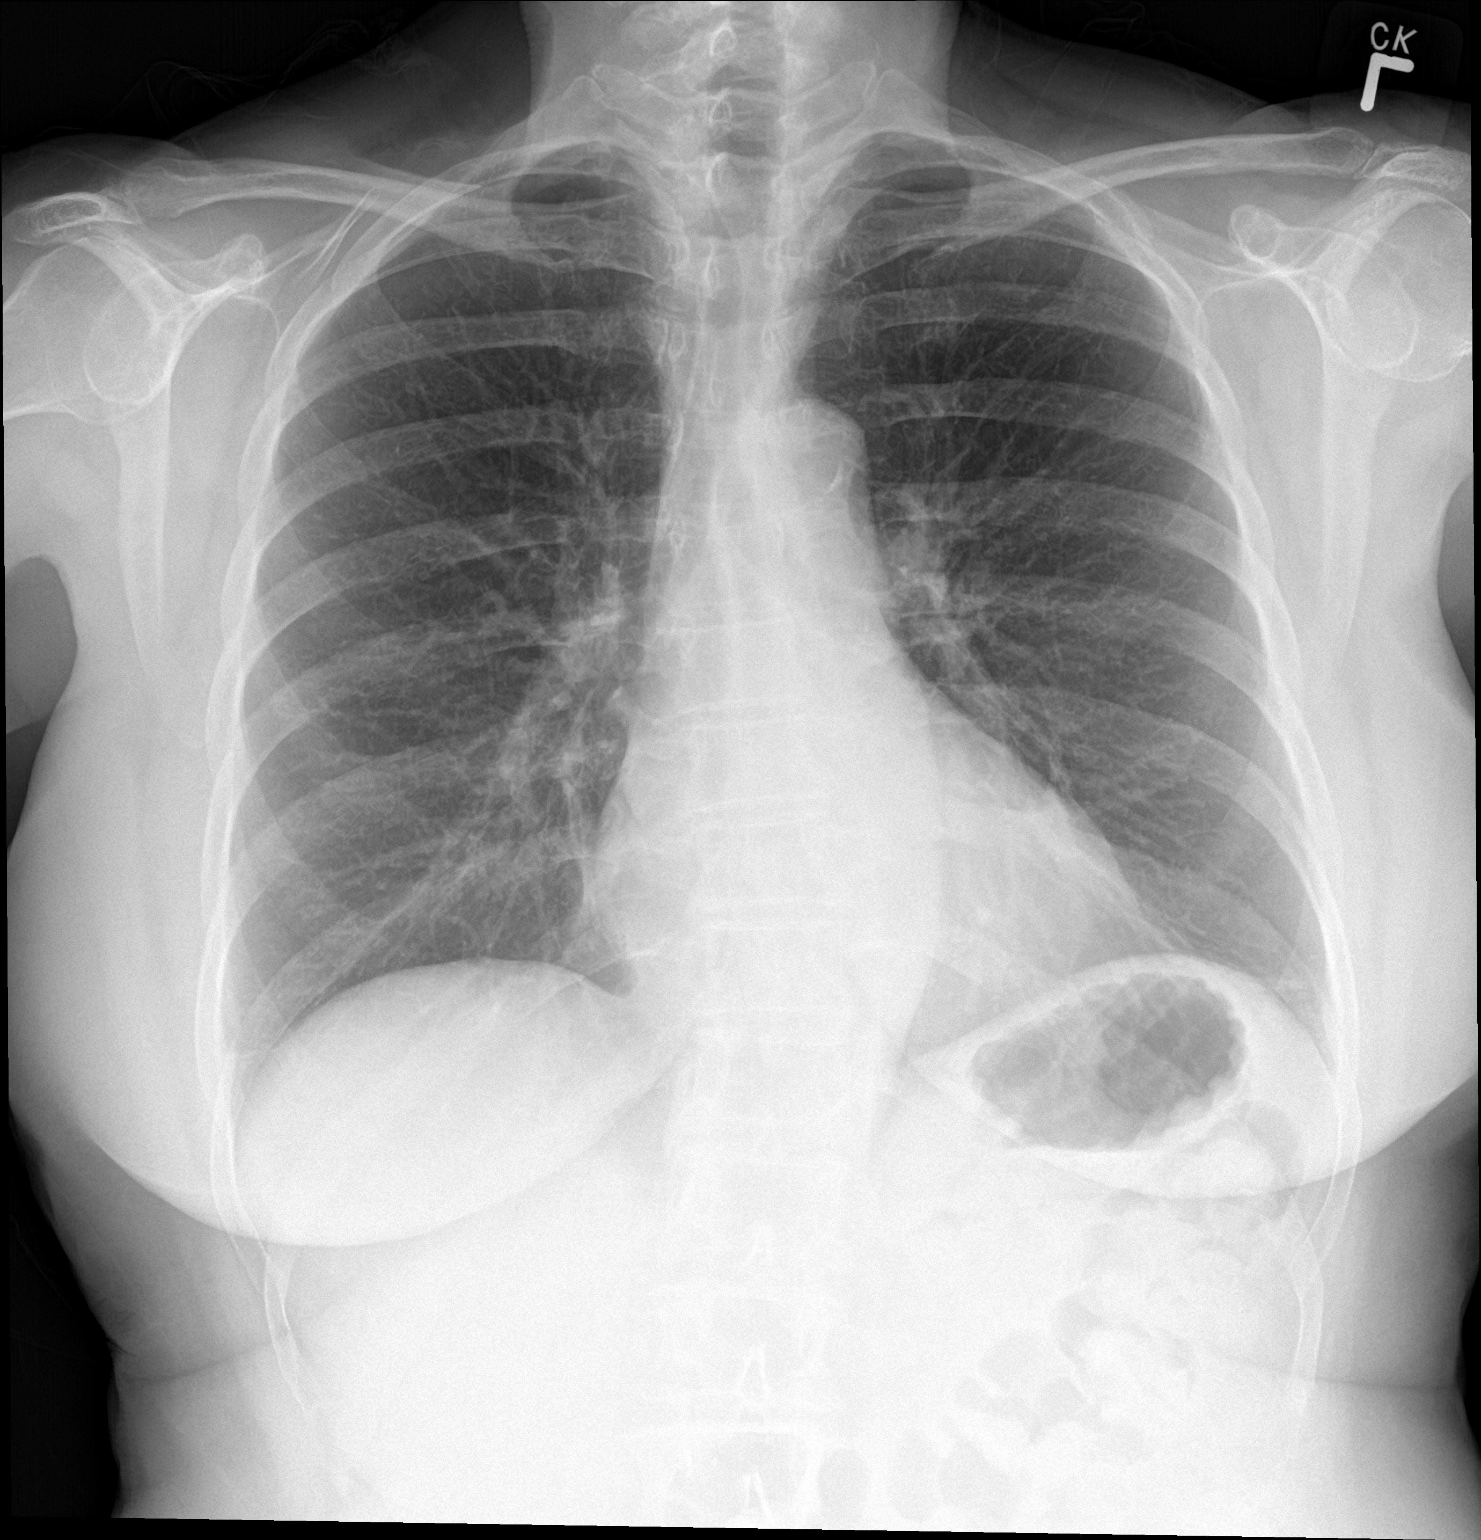

[chest lat]
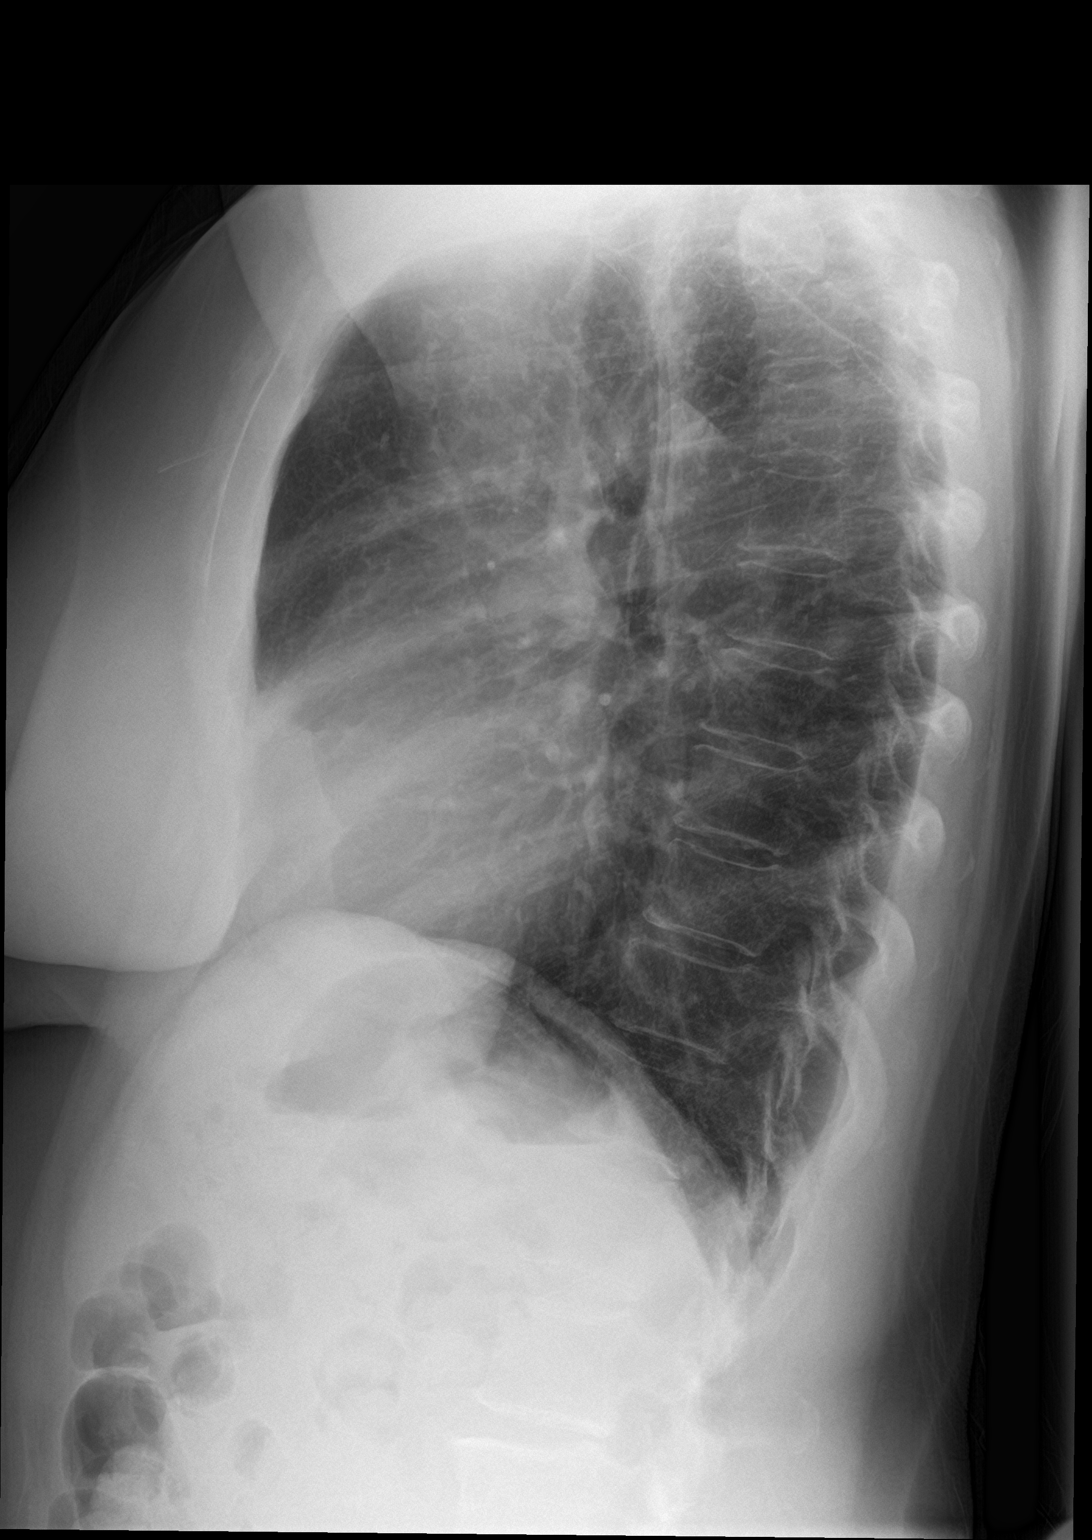

[2 of 2 positions shown; findings below may reference images not displayed]

FINDINGS: The heart size and mediastinal contours are within normal limits.
Mild aortic atherosclerosis without aneurysmal dilatation. Both
lungs are clear. The visualized skeletal structures are
unremarkable.
IMPRESSION: 1. No active cardiopulmonary disease.
2. Aortic atherosclerosis.

## 2020-06-01 DIAGNOSIS — R635 Abnormal weight gain: Secondary | ICD-10-CM

## 2020-06-14 ENCOUNTER — Encounter: Payer: Self-pay | Admitting: Family Medicine

## 2020-06-17 ENCOUNTER — Other Ambulatory Visit: Payer: Self-pay

## 2020-06-17 ENCOUNTER — Encounter (INDEPENDENT_AMBULATORY_CARE_PROVIDER_SITE_OTHER): Payer: Self-pay | Admitting: Family Medicine

## 2020-06-17 ENCOUNTER — Ambulatory Visit (INDEPENDENT_AMBULATORY_CARE_PROVIDER_SITE_OTHER): Payer: Medicare HMO | Admitting: Family Medicine

## 2020-06-17 VITALS — BP 133/82 | HR 68 | Temp 98.0°F | Ht 62.0 in | Wt 179.0 lb

## 2020-06-17 DIAGNOSIS — Z1331 Encounter for screening for depression: Secondary | ICD-10-CM | POA: Diagnosis not present

## 2020-06-17 DIAGNOSIS — E7849 Other hyperlipidemia: Secondary | ICD-10-CM | POA: Diagnosis not present

## 2020-06-17 DIAGNOSIS — R5383 Other fatigue: Secondary | ICD-10-CM | POA: Insufficient documentation

## 2020-06-17 DIAGNOSIS — Z6832 Body mass index (BMI) 32.0-32.9, adult: Secondary | ICD-10-CM | POA: Diagnosis not present

## 2020-06-17 DIAGNOSIS — R739 Hyperglycemia, unspecified: Secondary | ICD-10-CM

## 2020-06-17 DIAGNOSIS — E669 Obesity, unspecified: Secondary | ICD-10-CM | POA: Diagnosis not present

## 2020-06-17 DIAGNOSIS — I10 Essential (primary) hypertension: Secondary | ICD-10-CM

## 2020-06-17 DIAGNOSIS — G4733 Obstructive sleep apnea (adult) (pediatric): Secondary | ICD-10-CM

## 2020-06-17 DIAGNOSIS — Z0289 Encounter for other administrative examinations: Secondary | ICD-10-CM

## 2020-06-17 DIAGNOSIS — E559 Vitamin D deficiency, unspecified: Secondary | ICD-10-CM | POA: Diagnosis not present

## 2020-06-18 LAB — CBC WITH DIFFERENTIAL/PLATELET
Basophils Absolute: 0.1 10*3/uL (ref 0.0–0.2)
Basos: 2 %
EOS (ABSOLUTE): 0.1 10*3/uL (ref 0.0–0.4)
Eos: 2 %
Hematocrit: 44 % (ref 34.0–46.6)
Hemoglobin: 14.3 g/dL (ref 11.1–15.9)
Immature Grans (Abs): 0 10*3/uL (ref 0.0–0.1)
Immature Granulocytes: 0 %
Lymphocytes Absolute: 1.3 10*3/uL (ref 0.7–3.1)
Lymphs: 25 %
MCH: 32.6 pg (ref 26.6–33.0)
MCHC: 32.5 g/dL (ref 31.5–35.7)
MCV: 100 fL — ABNORMAL HIGH (ref 79–97)
Monocytes Absolute: 0.4 10*3/uL (ref 0.1–0.9)
Monocytes: 8 %
Neutrophils Absolute: 3.2 10*3/uL (ref 1.4–7.0)
Neutrophils: 63 %
Platelets: 329 10*3/uL (ref 150–450)
RBC: 4.39 x10E6/uL (ref 3.77–5.28)
RDW: 12.5 % (ref 11.7–15.4)
WBC: 5 10*3/uL (ref 3.4–10.8)

## 2020-06-18 LAB — COMPREHENSIVE METABOLIC PANEL
ALT: 13 IU/L (ref 0–32)
AST: 19 IU/L (ref 0–40)
Albumin/Globulin Ratio: 1.8 (ref 1.2–2.2)
Albumin: 4.3 g/dL (ref 3.7–4.7)
Alkaline Phosphatase: 61 IU/L (ref 48–121)
BUN/Creatinine Ratio: 25 (ref 12–28)
BUN: 22 mg/dL (ref 8–27)
Bilirubin Total: 0.4 mg/dL (ref 0.0–1.2)
CO2: 26 mmol/L (ref 20–29)
Calcium: 9.7 mg/dL (ref 8.7–10.3)
Chloride: 100 mmol/L (ref 96–106)
Creatinine, Ser: 0.88 mg/dL (ref 0.57–1.00)
GFR calc Af Amer: 74 mL/min/{1.73_m2} (ref >59)
GFR calc non Af Amer: 64 mL/min/{1.73_m2} (ref >59)
Globulin, Total: 2.4 g/dL (ref 1.5–4.5)
Glucose: 89 mg/dL (ref 65–99)
Potassium: 5.1 mmol/L (ref 3.5–5.2)
Sodium: 139 mmol/L (ref 134–144)
Total Protein: 6.7 g/dL (ref 6.0–8.5)

## 2020-06-18 LAB — T3: T3, Total: 94 ng/dL (ref 71–180)

## 2020-06-18 LAB — LIPID PANEL
Chol/HDL Ratio: 3.1 ratio (ref 0.0–4.4)
Cholesterol, Total: 192 mg/dL (ref 100–199)
HDL: 61 mg/dL (ref >39)
LDL Chol Calc (NIH): 106 mg/dL — ABNORMAL HIGH (ref 0–99)
Triglycerides: 144 mg/dL (ref 0–149)
VLDL Cholesterol Cal: 25 mg/dL (ref 5–40)

## 2020-06-18 LAB — HEMOGLOBIN A1C
Est. average glucose Bld gHb Est-mCnc: 114 mg/dL
Hgb A1c MFr Bld: 5.6 % (ref 4.8–5.6)

## 2020-06-18 LAB — VITAMIN D 25 HYDROXY (VIT D DEFICIENCY, FRACTURES): Vit D, 25-Hydroxy: 46.4 ng/mL (ref 30.0–100.0)

## 2020-06-18 LAB — T4: T4, Total: 6.1 ug/dL (ref 4.5–12.0)

## 2020-06-18 LAB — INSULIN, RANDOM: INSULIN: 8.6 u[IU]/mL (ref 2.6–24.9)

## 2020-06-18 LAB — TSH: TSH: 2.37 u[IU]/mL (ref 0.450–4.500)

## 2020-06-22 ENCOUNTER — Ambulatory Visit: Payer: Medicare HMO | Admitting: Internal Medicine

## 2020-06-24 NOTE — Progress Notes (Signed)
Chief Complaint:   OBESITY Mandy [name] (MR# [id number]) is a 74 y.o. female who presents for evaluation and treatment of obesity and related comorbidities. Current BMI is Body mass index is 32.74 kg/m. Mandy [name] has been struggling with her weight for many years and has been unsuccessful in either losing weight, maintaining weight loss, or reaching her healthy weight goal.  Mandy [name] is currently in the action stage of change and ready to dedicate time achieving and maintaining a healthier weight. Mandy [name] is interested in becoming our patient and working on intensive lifestyle modifications including (but not limited to) diet and exercise for weight loss.  Mandy [name]'s habits were reviewed today and are as follows: Her family eats meals together, she thinks her family will eat healthier with her, her desired weight loss is 34 lbs, she started gaining weight the last several years, her heaviest weight ever was 180 pounds, she has significant food cravings issues, she snacks frequently in the evenings, she frequently eats larger portions than normal and she struggles with emotional eating.  Depression Screen Mandy [name]'s Food and Mood (modified PHQ-9) score was 3.  Depression screen PHQ 2/9 06/17/2020  Decreased Interest 1  Down, Depressed, Hopeless 0  PHQ - 2 Score 1  Altered sleeping 0  Tired, decreased energy 1  Change in appetite 1  Feeling bad or failure about yourself  0  Trouble concentrating 0  Moving slowly or fidgety/restless 0  Suicidal thoughts 0  PHQ-9 Score 3  Difficult doing work/chores Not difficult at all   Subjective:   1. Other fatigue Mandy [name] admits to daytime somnolence and admits to waking up still tired. Patent has a history of symptoms of daytime fatigue. Mandy [name] generally gets 5 or 6 hours of sleep per night, and states that she has nightime awakenings. Snoring is not present. Apneic episodes are present. Epworth Sleepiness Score is 7.  2. OSA (obstructive sleep apnea) Mandy [name] uses CPAP most of  the evenings, but she still notes fatigue.  3. Vitamin D deficiency Mandy [name] is on Vit D, but she still notes fatigue.  4. Essential hypertension Mandy [name]'s blood pressure is well controlled, and she denies chest pain.  5. Other hyperlipidemia Mandy [name] is seeing Mandy [name] for statin intolerant hyperlipidemia. She is working on diet, exercise, and weight loss to improve cholesterol.   6. Hyperglycemia Mandy [name] has a history of occasional slightly increased glucose readings. She notes polyphagia.  Assessment/Plan:   1. Other fatigue Mandy [name] does feel that her weight is causing her energy to be lower than it should be. Fatigue may be related to obesity, depression or many other causes. Labs will be ordered, and in the meanwhile, Mandy [name] will focus on self care including making healthy food choices, increasing physical activity and focusing on stress reduction.  - EKG 12-Lead - CBC with Differential/Platelet - T3 - T4 - TSH  2. OSA (obstructive sleep apnea) Intensive lifestyle modifications are the first line treatment for this issue. We discussed several lifestyle modifications today. Mandy [name] will continue to work on diet, exercise and weight loss efforts to improve obstructive sleep apnea. We will continue to monitor. Orders and follow up as documented in patient record.   3. Vitamin D deficiency Low Vitamin D level contributes to fatigue and are associated with obesity, breast, and colon cancer. We will check labs today, and Mandy [name] will follow-up for routine testing of Vitamin D, at least 2-3 times per year to avoid over-replacement.  - VITAMIN D 25 Hydroxy (Vit-D Deficiency, Fractures)  4. Essential hypertension Mandy [name] will start her Category 2 plan, and will continue working on healthy weight loss and exercise to improve blood pressure control. We will watch for signs of hypotension as she continues her lifestyle modifications. We will check labs today.  - Comprehensive metabolic panel  5. Other  hyperlipidemia Cardiovascular risk and specific lipid/LDL goals reviewed. We discussed several lifestyle modifications today and Mandy [name] will start her Category 2 plan, and will continue to work on exercise and weight loss efforts. We will check labs today. Orders and follow up as documented in patient record.   - Lipid panel  6. Hyperglycemia Fasting labs will be obtained today and results with be discussed with Mandy [name] in 2 weeks at her follow up visit. In the meanwhile Mandy [name] will start her Category 2 plan and will work on weight loss efforts.  - Hemoglobin A1c - Insulin, random  7. Depression screening Mandy [name] had a negative depression screening. Depression is commonly associated with obesity and often results in emotional eating behaviors. We will monitor this closely and work on CBT to help improve the non-hunger eating patterns. Referral to Psychology may be required if no improvement is seen as she continues in our clinic.  8. Class 1 obesity with serious comorbidity and body mass index (BMI) of 32.0 to 32.9 in adult, unspecified obesity type Mandy [name] is currently in the action stage of change and her goal is to continue with weight loss efforts. I recommend Mandy [name] begin the structured treatment plan as follows:  She has agreed to the Category 2 Plan.  Exercise goals: No exercise has been prescribed for now, while we concentrate on nutritional changes.  Behavioral modification strategies: increasing lean protein intake.  She was informed of the importance of frequent follow-up visits to maximize her success with intensive lifestyle modifications for her multiple health conditions. She was informed we would discuss her lab results at her next visit unless there is a critical issue that needs to be addressed sooner. Mandy [name] agreed to keep her next visit at the agreed upon time to discuss these results.  Objective:   Blood pressure 133/82, pulse 68, temperature 98 F (36.7 C), height 5\' 2"  (1.575 m), weight  179 lb (81.2 kg), SpO2 98 %. Body mass index is 32.74 kg/m.  EKG: Normal sinus rhythm, rate 67 BPM.  Indirect Calorimeter completed today shows a VO2 of 233 and a REE of 1621.  Her calculated basal metabolic rate is 8676 thus her basal metabolic rate is better than expected.  General: Cooperative, alert, well developed, in no acute distress. HEENT: Conjunctivae and lids unremarkable. Cardiovascular: Regular rhythm.  Lungs: Normal work of breathing. Neurologic: No focal deficits.   Lab Results  Component Value Date   CREATININE 0.88 06/17/2020   BUN 22 06/17/2020   NA 139 06/17/2020   K 5.1 06/17/2020   CL 100 06/17/2020   CO2 26 06/17/2020   Lab Results  Component Value Date   ALT 13 06/17/2020   AST 19 06/17/2020   ALKPHOS 61 06/17/2020   BILITOT 0.4 06/17/2020   Lab Results  Component Value Date   HGBA1C 5.6 06/17/2020   Lab Results  Component Value Date   INSULIN 8.6 06/17/2020   Lab Results  Component Value Date   TSH 2.370 06/17/2020   Lab Results  Component Value Date   CHOL 192 06/17/2020   HDL 61 06/17/2020   LDLCALC 106 (H) 06/17/2020   LDLDIRECT 113.4 11/12/2012   TRIG 144 06/17/2020  CHOLHDL 3.1 06/17/2020   Lab Results  Component Value Date   WBC 5.0 06/17/2020   HGB 14.3 06/17/2020   HCT 44.0 06/17/2020   MCV 100 (H) 06/17/2020   PLT 329 06/17/2020   No results found for: IRON, TIBC, FERRITIN Obesity Behavioral Intervention:   Approximately 15 minutes were spent on the discussion below.  ASK: We discussed the diagnosis of obesity with Mandy [name] today and Mandy [name] agreed to give [place] permission to discuss obesity behavioral modification therapy today.  ASSESS: Mandy [name] has the diagnosis of obesity and her BMI today is 32.73. Mandy [name] is in the action stage of change.   ADVISE: Mandy [name] was educated on the multiple health risks of obesity as well as the benefit of weight loss to improve her health. She was advised of the need for long term treatment and the  importance of lifestyle modifications to improve her current health and to decrease her risk of future health problems.  AGREE: Multiple dietary modification options and treatment options were discussed and [place] agreed to follow the recommendations documented in the above note.  ARRANGE: Mandy [name] was educated on the importance of frequent visits to treat obesity as outlined per CMS and USPSTF guidelines and agreed to schedule her next follow up appointment today.  Attestation Statements:   Reviewed by clinician on day of visit: allergies, medications, problem list, medical history, surgical history, family history, social history, and previous encounter notes.   I, Trixie Dredge, am acting as transcriptionist for Dennard Nip, MD.  I have reviewed the above documentation for accuracy and completeness, and I agree with the above. - Dennard Nip, MD

## 2020-06-28 DIAGNOSIS — Z6832 Body mass index (BMI) 32.0-32.9, adult: Secondary | ICD-10-CM | POA: Insufficient documentation

## 2020-06-28 DIAGNOSIS — E669 Obesity, unspecified: Secondary | ICD-10-CM | POA: Insufficient documentation

## 2020-06-28 DIAGNOSIS — R739 Hyperglycemia, unspecified: Secondary | ICD-10-CM | POA: Insufficient documentation

## 2020-07-01 ENCOUNTER — Ambulatory Visit (INDEPENDENT_AMBULATORY_CARE_PROVIDER_SITE_OTHER): Payer: Medicare HMO | Admitting: Family Medicine

## 2020-07-01 ENCOUNTER — Other Ambulatory Visit: Payer: Self-pay

## 2020-07-01 ENCOUNTER — Encounter (INDEPENDENT_AMBULATORY_CARE_PROVIDER_SITE_OTHER): Payer: Self-pay | Admitting: Family Medicine

## 2020-07-01 VITALS — BP 111/73 | HR 60 | Temp 98.1°F | Ht 62.0 in | Wt 174.0 lb

## 2020-07-01 DIAGNOSIS — E782 Mixed hyperlipidemia: Secondary | ICD-10-CM

## 2020-07-01 DIAGNOSIS — I10 Essential (primary) hypertension: Secondary | ICD-10-CM | POA: Diagnosis not present

## 2020-07-01 DIAGNOSIS — E669 Obesity, unspecified: Secondary | ICD-10-CM

## 2020-07-01 DIAGNOSIS — Z6831 Body mass index (BMI) 31.0-31.9, adult: Secondary | ICD-10-CM

## 2020-07-04 NOTE — Progress Notes (Signed)
Chief Complaint:   OBESITY Mandy [name] is here to discuss her progress with her obesity treatment plan along with follow-up of her obesity related diagnoses. Mandy [name] is on the Category 2 Plan and states she is following her eating plan approximately 100% of the time. Mandy [name] states she is walking for 40 minutes 7 times per week, and doing physical therapy for 30 minutes 2 times per week.  Today's visit was #: 2 Starting weight: 179 lbs Starting date: 06/17/2020 Today's weight: 174 lbs Today's date: 07/01/2020 Total lbs lost to date: 5 Total lbs lost since last in-office visit: 5  Interim History: Mandy [name] has done very well with weight loss on her Category 2 plan. She skipped the cheese but otherwise followed it closely.  Subjective:   1. Mixed hyperlipidemia Mandy [name] is on Zetia and she is working on diet and exercise. She denies chest pain. She was started on a PSK-9 by Dr, Debara Pickett. I discussed labs with the patient today.  2. Essential hypertension Mandy [name]'s blood pressure is well controlled on Dyazide. She is doing well with diet and weight loss efforts.  Assessment/Plan:   1. Mixed hyperlipidemia Cardiovascular risk and specific lipid/LDL goals reviewed. We discussed several lifestyle modifications today and [name] will continue to work on diet, exercise and weight loss efforts, and will continue to follow closely. Orders and follow up as documented in patient record.   2. Essential hypertension Mandy [name] will continue working on healthy weight loss, diet, and exercise to improve blood pressure control. We will watch for signs of hypotension as she continues her lifestyle modifications.  3. Class 1 obesity with serious comorbidity and body mass index (BMI) of 31.0 to 31.9 in adult, unspecified obesity type Mandy [name] is currently in the action stage of change. As such, her goal is to continue with weight loss efforts. She has agreed to the Category 2 Plan with lunch options.   Mandy [name] is ok to use [place] yogurt instead of  cheese.  Exercise goals: As is.  Behavioral modification strategies: increasing lean protein intake.  Mandy [name] has agreed to follow-up with our clinic in 2 weeks. She was informed of the importance of frequent follow-up visits to maximize her success with intensive lifestyle modifications for her multiple health conditions.   Objective:   Blood pressure 111/73, pulse 60, temperature 98.1 F (36.7 C), height 5\' 2"  (1.575 m), weight 174 lb (78.9 kg), SpO2 98 %. Body mass index is 31.83 kg/m.  General: Cooperative, alert, well developed, in no acute distress. HEENT: Conjunctivae and lids unremarkable. Cardiovascular: Regular rhythm.  Lungs: Normal work of breathing. Neurologic: No focal deficits.   Lab Results  Component Value Date   CREATININE 0.88 06/17/2020   BUN 22 06/17/2020   NA 139 06/17/2020   K 5.1 06/17/2020   CL 100 06/17/2020   CO2 26 06/17/2020   Lab Results  Component Value Date   ALT 13 06/17/2020   AST 19 06/17/2020   ALKPHOS 61 06/17/2020   BILITOT 0.4 06/17/2020   Lab Results  Component Value Date   HGBA1C 5.6 06/17/2020   Lab Results  Component Value Date   INSULIN 8.6 06/17/2020   Lab Results  Component Value Date   TSH 2.370 06/17/2020   Lab Results  Component Value Date   CHOL 192 06/17/2020   HDL 61 06/17/2020   LDLCALC 106 (H) 06/17/2020   LDLDIRECT 113.4 11/12/2012   TRIG 144 06/17/2020   CHOLHDL 3.1 06/17/2020   Lab Results  Component Value  Date   WBC 5.0 06/17/2020   HGB 14.3 06/17/2020   HCT 44.0 06/17/2020   MCV 100 (H) 06/17/2020   PLT 329 06/17/2020   No results found for: IRON, TIBC, FERRITIN  Attestation Statements:   Reviewed by clinician on day of visit: allergies, medications, problem list, medical history, surgical history, family history, social history, and previous encounter notes.  Time spent on visit including pre-visit chart review and post-visit care and charting was 34 minutes.    I, Trixie Dredge, am  acting as transcriptionist for Dennard Nip, MD.  I have reviewed the above documentation for accuracy and completeness, and I agree with the above. -  Dennard Nip, MD

## 2020-07-05 DIAGNOSIS — R69 Illness, unspecified: Secondary | ICD-10-CM | POA: Diagnosis not present

## 2020-07-05 DIAGNOSIS — G4733 Obstructive sleep apnea (adult) (pediatric): Secondary | ICD-10-CM | POA: Diagnosis not present

## 2020-07-12 ENCOUNTER — Telehealth: Payer: Self-pay | Admitting: Internal Medicine

## 2020-07-12 NOTE — Telephone Encounter (Signed)
Patient states she had lab work done by Dr. Migdalia Dk office a few weeks ago and would like to know if she still needs to have it done prior to her appointment with Dr. Debara Pickett.

## 2020-07-12 NOTE — Telephone Encounter (Signed)
MyChart message sent No additional lab work needed

## 2020-07-13 ENCOUNTER — Other Ambulatory Visit: Payer: Self-pay

## 2020-07-13 ENCOUNTER — Ambulatory Visit (INDEPENDENT_AMBULATORY_CARE_PROVIDER_SITE_OTHER): Payer: Medicare HMO | Admitting: *Deleted

## 2020-07-13 DIAGNOSIS — Z23 Encounter for immunization: Secondary | ICD-10-CM

## 2020-07-15 ENCOUNTER — Ambulatory Visit (INDEPENDENT_AMBULATORY_CARE_PROVIDER_SITE_OTHER): Payer: Medicare HMO | Admitting: Family Medicine

## 2020-07-15 ENCOUNTER — Encounter (INDEPENDENT_AMBULATORY_CARE_PROVIDER_SITE_OTHER): Payer: Self-pay | Admitting: Family Medicine

## 2020-07-15 ENCOUNTER — Other Ambulatory Visit: Payer: Self-pay

## 2020-07-15 VITALS — BP 131/81 | HR 67 | Temp 97.8°F | Ht 62.0 in | Wt 171.8 lb

## 2020-07-15 DIAGNOSIS — Z6831 Body mass index (BMI) 31.0-31.9, adult: Secondary | ICD-10-CM

## 2020-07-15 DIAGNOSIS — I10 Essential (primary) hypertension: Secondary | ICD-10-CM | POA: Diagnosis not present

## 2020-07-15 DIAGNOSIS — E669 Obesity, unspecified: Secondary | ICD-10-CM

## 2020-07-15 NOTE — Progress Notes (Signed)
Chief Complaint:   OBESITY Federica is here to discuss her progress with her obesity treatment plan along with follow-up of her obesity related diagnoses. Amberly is on the Category 2 Plan and states she is following her eating plan approximately 100% of the time. 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  Today's visit was #: 3 Starting weight: 179 lbs Starting date: 06/17/2020 Today's weight: 171 lbs Today's date: 07/19/2020 Total lbs lost to date: 8 lbs Total lbs lost since last in-office visit: 3 lbs  Interim History:  This is my first time seeing the pt.   Previously seen by Dr Leafy Ro.  Austyn says that the plan is great.  "I am never physically hungry."   No cravings.  Denies sweet cravings.   Snack calories - [place] bar or fruit.   "I eat 6 ounces every evening for dinner.  Weighing foods and measuring.  Feeling great and happy with her progress.   Subjective:    1. Hypertension, unspecified type Review: taking medications as instructed, no medication side effects noted, no chest pain on exertion, no dyspnea on exertion, no swelling of ankles.  She is on Dyazide without side effects or concerns.  BP Readings from Last 3 Encounters:  07/16/20 120/68  07/15/20 131/81  07/01/20 111/73     Assessment/Plan:   1. Hypertension, unspecified type Amariyana is working on healthy weight loss and exercise to improve blood pressure control. We will watch for signs of hypotension as she continues her lifestyle modifications.  Blood pressure at goal.  Continue home blood pressure monitoring and will follow closely as she loses weight.  2. Class 1 obesity with serious comorbidity and body mass index (BMI) of 31.0 to 31.9 in adult, unspecified obesity type  Kaydence is currently in the action stage of change. As such, her goal is to continue with weight loss efforts. She has agreed to the Category 2 Plan.   Exercise goals: As is.  Behavioral  modification strategies: increasing lean protein intake, increasing water intake, meal planning and cooking strategies, keeping healthy foods in the home and planning for success.  Iveth has agreed to follow-up with our clinic in 2 weeks. She was informed of the importance of frequent follow-up visits to maximize her success with intensive lifestyle modifications for her multiple health conditions.   Objective:   Blood pressure 131/81, pulse 67, temperature 97.8 F (36.6 C), height 5\' 2"  (1.575 m), weight 171 lb 12.8 oz (77.9 kg), SpO2 97 %. Body mass index is 31.42 kg/m.  General: Cooperative, alert, well developed, in no acute distress. HEENT: Conjunctivae and lids unremarkable. Cardiovascular: Regular rhythm.  Lungs: Normal work of breathing. Neurologic: No focal deficits.   Lab Results  Component Value Date   CREATININE 0.88 06/17/2020   BUN 22 06/17/2020   NA 139 06/17/2020   K 5.1 06/17/2020   CL 100 06/17/2020   CO2 26 06/17/2020   Lab Results  Component Value Date   ALT 13 06/17/2020   AST 19 06/17/2020   ALKPHOS 61 06/17/2020   BILITOT 0.4 06/17/2020   Lab Results  Component Value Date   HGBA1C 5.6 06/17/2020   Lab Results  Component Value Date   INSULIN 8.6 06/17/2020   Lab Results  Component Value Date   TSH 2.370 06/17/2020   Lab Results  Component Value Date   CHOL 192 06/17/2020   HDL 61 06/17/2020   LDLCALC 106 (  H) 06/17/2020   LDLDIRECT 113.4 11/12/2012   TRIG 144 06/17/2020   CHOLHDL 3.1 06/17/2020   Lab Results  Component Value Date   WBC 5.0 06/17/2020   HGB 14.3 06/17/2020   HCT 44.0 06/17/2020   MCV 100 (H) 06/17/2020   PLT 329 06/17/2020   Obesity Behavioral Intervention:   Approximately 15 minutes were spent on the discussion below.  ASK: We discussed the diagnosis of obesity with Lelon Frohlich today and Allana agreed to give [place] permission to discuss obesity behavioral modification therapy today.  ASSESS: Trenae has the diagnosis of obesity  and her BMI today is 31.4. Leia is in the action stage of change.   ADVISE: Kirstyn was educated on the multiple health risks of obesity as well as the benefit of weight loss to improve her health. She was advised of the need for long term treatment and the importance of lifestyle modifications to improve her current health and to decrease her risk of future health problems.  AGREE: Multiple dietary modification options and treatment options were discussed and Merranda agreed to follow the recommendations documented in the above note.  ARRANGE: Kamoria was educated on the importance of frequent visits to treat obesity as outlined per CMS and USPSTF guidelines and agreed to schedule her next follow up appointment today.  Attestation Statements:   Reviewed by clinician on day of visit: allergies, medications, problem list, medical history, surgical history, family history, social history, and previous encounter notes.  I, Water quality scientist, CMA, am acting as Location manager for [employer], DO.  I have reviewed the above documentation for accuracy and completeness, and I agree with the above. Mellody Dance, DO

## 2020-07-16 ENCOUNTER — Ambulatory Visit (INDEPENDENT_AMBULATORY_CARE_PROVIDER_SITE_OTHER): Payer: Medicare HMO | Admitting: Internal Medicine

## 2020-07-16 ENCOUNTER — Encounter: Payer: Self-pay | Admitting: Internal Medicine

## 2020-07-16 VITALS — BP 120/68 | HR 73 | Ht 62.0 in | Wt 177.0 lb

## 2020-07-16 DIAGNOSIS — M791 Myalgia, unspecified site: Secondary | ICD-10-CM | POA: Diagnosis not present

## 2020-07-16 DIAGNOSIS — T466X5A Adverse effect of antihyperlipidemic and antiarteriosclerotic drugs, initial encounter: Secondary | ICD-10-CM

## 2020-07-16 DIAGNOSIS — E782 Mixed hyperlipidemia: Secondary | ICD-10-CM | POA: Diagnosis not present

## 2020-07-16 DIAGNOSIS — E66811 Obesity, class 1: Secondary | ICD-10-CM

## 2020-07-16 DIAGNOSIS — E669 Obesity, unspecified: Secondary | ICD-10-CM | POA: Diagnosis not present

## 2020-07-16 NOTE — Patient Instructions (Signed)
Medication Instructions:  Your physician recommends that you continue on your current medications as directed. Please refer to the Current Medication list given to you today.  *If you need a refill on your cardiac medications before your next appointment, please call your pharmacy*   Lab Work: FASTING lipid panel in 1 year - complete before your next visit with Dr. Debara Pickett   If you have labs (blood work) drawn today and your tests are completely normal, you will receive your results only by: Marland Kitchen MyChart Message (if you have MyChart) OR . A paper copy in the mail If you have any lab test that is abnormal or we need to change your treatment, we will call you to review the results.   Testing/Procedures: NONE   Follow-Up: At [facility], you and your health needs are our priority.  As part of our continuing mission to provide you with exceptional heart care, we have created designated Provider Care Teams.  These Care Teams include your primary Cardiologist (physician) and Advanced Practice Providers (APPs -  Physician Assistants and Nurse Practitioners) who all work together to provide you with the care you need, when you need it.  We recommend signing up for the patient portal called "MyChart".  Sign up information is provided on this After Visit Summary.  MyChart is used to connect with patients for Virtual Visits (Telemedicine).  Patients are able to view lab/test results, encounter notes, upcoming appointments, etc.  Non-urgent messages can be sent to your provider as well.   To learn more about what you can do with MyChart, go to [email or web address].    Your next appointment:   12 month(s) - lipid clinic  The format for your next appointment:   In Person  Provider:   K. Mali Hilty MD   Other Instructions

## 2020-07-16 NOTE — Progress Notes (Signed)
LIPID CLINIC CONSULT NOTE  Chief Complaint:  Statin intolerance  Primary Care Physician: Mosie Lukes, MD  Primary Cardiologist:  No primary care provider on file.  HPI:  Mandy [name] is a 74 y.o. female who is being seen today for the evaluation of an intolerance at the request of Mosie Lukes, MD.  A pleasant 74 year old female who was kindly referred for statin intolerance.  She has a longstanding history of high cholesterol reported her mother had high cholesterol in her father died in an MI at age 46 however he was diabetic.  She had previously had reasonable cholesterol control with a total of 173 and LDL 101 on atorvastatin however then developed severe myalgias.  She had taken it for many years prior to that.  She stopped the atorvastatin and her symptoms completely resolved.  She was then advised to go on to Crestor 5 mg twice weekly.  She had very similar symptoms on the Crestor and discontinue that.  She ultimately went to a health food store and was started on red yeast rice and coenzyme Q10.  She reports healthy diet and exercises regularly getting more than 10,000 steps every day.  Other comorbidities include hypertension which is controlled.  She does not smoke.  07/16/2020  Mandy [name] is seen today in follow-up.  Overall she continues to do well.  She is managed to lose weight recently has been working with the weight management clinic at home.  She is very pleased with this.  Lipids have improved nicely on combination of over-the-counter red yeast rice which is tolerable and ezetimibe.  Most recently total cholesterol was 192, HDL 61, LDL 106 and triglycerides 144.  She had a low risk calcium score of 74, 55th percentile for age and sex matched control.  PMHx:  Past Medical History:  Diagnosis Date  . Atrophic vaginitis 11/18/2012  . Back pain   . Chicken pox as a child  . Cough 07/14/2016  . Dermatitis 04/26/2011  . GERD (gastroesophageal reflux disease) 03/20/2017    . High cholesterol   . HTN (hypertension) 04/13/2010   Qualifier: Diagnosis of  By: Charlett Blake MD, Erline Levine    . Hypertension   . Measles as a child  . Miscarriage 1981  . Onychomycosis 04/26/2011  . OSA (obstructive sleep apnea) 10/30/2017  . Osteopenia 10/17/1999  . Osteopenia 04/13/2010   Qualifier: Diagnosis of  By: Charlett Blake MD, Erline Levine    . Preventative health care 07/14/2016  . Screening for malignant neoplasm of the cervix 04/26/2011  . Sleep apnea   . Tinea pedis 05/10/2014  . Urinary frequency 03/20/2017    Past Surgical History:  Procedure Laterality Date  . DILATION AND CURETTAGE OF UTERUS  1981   miscarriage    FAMHx:  Family History  Problem Relation Age of Onset  . Osteoporosis Mother   . Coronary artery disease Mother   . Heart attack Mother 37  . Hypertension Mother   . Dementia Mother   . Hyperlipidemia Mother   . Stroke Mother   . Obesity Mother   . Diabetes Father        type 2  . Heart disease Father   . Stroke Father   . Obesity Father   . Dementia Maternal Grandmother   . Asthma Maternal Grandfather   . Cancer Paternal Grandmother        ovarian  . Dementia Paternal Grandmother   . Diabetes Paternal Grandfather  tyoe 2  . Hearing loss Paternal Grandfather   . Dementia Brother        Lewy Body  . Arthritis Other   . Hyperlipidemia Other   . Hypertension Other   . Other Other        Cardiovascular disorder    SOCHx:   reports that she quit smoking about 19 years ago. Her smoking use included cigarettes. She has a 40.00 pack-year smoking history. She has never used smokeless tobacco. She reports that she does not drink alcohol and does not use drugs.  ALLERGIES:  Allergies  Allergen Reactions  . Codeine   . Pneumococcal Vaccine Polyvalent     REACTION: rash at injection site and induration.    ROS: Pertinent items noted in HPI and remainder of comprehensive ROS otherwise negative.  HOME MEDS: Current Outpatient Medications on File Prior to  Visit  Medication Sig Dispense Refill  . aspirin 81 MG tablet Take 81 mg by mouth daily.    Marland Kitchen b complex vitamins tablet Take 1 tablet by mouth daily.    . calcium citrate-vitamin D (CITRACAL+D) 315-200 MG-UNIT per tablet Take 1 tablet by mouth 2 (two) times daily.      . Loperamide HCl (IMODIUM PO) Take 1 tablet by mouth daily.     . Methylsulfonylmethane (MSM PO) Take 2 capsules by mouth daily. 1,000 mg 3 x daily    . multivitamin (THERAGRAN) per tablet Take 1 tablet by mouth daily.      . mupirocin ointment (BACTROBAN) 2 % Place 1 application into the nose at bedtime as needed. 22 g 1  . Nutritional Supplements (VITAMIN D BOOSTER PO) Take by mouth.    . Omega-3 Fatty Acids-Vitamin E (COROMEGA PO) Take by mouth.    Marland Kitchen PREMARIN vaginal cream APPLY A SMALL AMOUNT (APPROXIMATELY 0.25G)PER VAGINA 3 TIMES A WEEK AT BEDTIME. 30 g 0  . Probiotic Product (PROBIOTIC DAILY PO) Take by mouth daily.    . Red Yeast Rice Extract (RED YEAST RICE PO) Take by mouth.    . triamterene-hydrochlorothiazide (DYAZIDE) 37.5-25 MG capsule TAKE (1) CAPSULE DAILY. 90 capsule 0  . TURMERIC PO Take 1 capsule by mouth daily.     . vitamin C (ASCORBIC ACID) 500 MG tablet Take 500 mg by mouth daily.    . Zinc Acetate, Oral, (ZINC ACETATE PO) Take by mouth.    . ezetimibe (ZETIA) 10 MG tablet Take 1 tablet (10 mg total) by mouth daily. 90 tablet 3   No current facility-administered medications on file prior to visit.    LABS/IMAGING: No results found for this or any previous visit (from the past 48 hour(s)). No results found.  LIPID PANEL:    Component Value Date/Time   CHOL 192 06/17/2020 0917   CHOL 205 (H) 11/12/2012 0808   TRIG 144 06/17/2020 0917   TRIG 90 11/12/2012 0808   HDL 61 06/17/2020 0917   HDL 55 11/12/2012 0808   CHOLHDL 3.1 06/17/2020 0917   CHOLHDL 4 02/05/2020 0713   VLDL 13.4 02/05/2020 0713   LDLCALC 106 (H) 06/17/2020 0917   LDLCALC 132 (H) 11/12/2012 0808   LDLDIRECT 113.4 11/12/2012  0808    WEIGHTS: Wt Readings from Last 3 Encounters:  07/16/20 177 lb (80.3 kg)  07/15/20 171 lb 12.8 oz (77.9 kg)  07/01/20 174 lb (78.9 kg)    VITALS: BP 120/68   Pulse 73   Ht 5\' 2"  (1.575 m)   Wt 177 lb (80.3 kg)  SpO2 98%   BMI 32.37 kg/m   EXAM: General appearance: alert and no distress Lungs: clear to auscultation bilaterally Heart: regular rate and rhythm Extremities: extremities normal, atraumatic, no cyanosis or edema Neurologic: Grossly normal  EKG: Deferred  ASSESSMENT: 1. Mixed dyslipidemia, low to intermediate 10-year risk by pooled cohort equation 2. Low risk calcium score at 74, 55th percentile (03/2020) 3. Statin intolerant, myalgias  PLAN: 1.   Mandy [name] has had some good improvement in her dyslipidemia with weight loss and the addition of ezetimibe.  LDL is now just over 100.  I suspect this will improve further with additional weight loss.  Overall she is pleased with how she feels and is having no side effects with her current combination of treatments.  Plan follow-up with me annually or sooner as necessary.  Pixie Casino, MD, [facility], [place] Director of the Advanced Lipid Disorders &  Cardiovascular Risk Reduction Clinic Diplomate of the American Board of Clinical Lipidology Attending Cardiologist  Direct Dial: [phone]  Fax: [phone]  Website:  [email or web address] 07/16/2020, 9:10 AM

## 2020-07-28 ENCOUNTER — Ambulatory Visit (INDEPENDENT_AMBULATORY_CARE_PROVIDER_SITE_OTHER): Payer: Medicare HMO | Admitting: Family Medicine

## 2020-08-03 ENCOUNTER — Other Ambulatory Visit: Payer: Self-pay

## 2020-08-03 ENCOUNTER — Encounter (INDEPENDENT_AMBULATORY_CARE_PROVIDER_SITE_OTHER): Payer: Self-pay | Admitting: Family Medicine

## 2020-08-03 ENCOUNTER — Ambulatory Visit (INDEPENDENT_AMBULATORY_CARE_PROVIDER_SITE_OTHER): Payer: Medicare HMO | Admitting: Family Medicine

## 2020-08-03 VITALS — BP 130/72 | HR 58 | Temp 97.6°F | Ht 62.0 in | Wt 167.0 lb

## 2020-08-03 DIAGNOSIS — E669 Obesity, unspecified: Secondary | ICD-10-CM

## 2020-08-03 DIAGNOSIS — E860 Dehydration: Secondary | ICD-10-CM

## 2020-08-03 DIAGNOSIS — Z6830 Body mass index (BMI) 30.0-30.9, adult: Secondary | ICD-10-CM

## 2020-08-10 NOTE — Progress Notes (Signed)
Chief Complaint:   OBESITY Mandy [name] is here to discuss her progress with her obesity treatment plan along with follow-up of her obesity related diagnoses. Mandy [name] is on the Category 2 Plan and states she is following her eating plan approximately 95% of the time. Mandy [name] states she is exercising with a personal trainer 2 times per week, and walking for 40 minutes 7 times per week.  Today's visit was #: 4 Starting weight: 179 lbs Starting date: 06/17/2020 Today's weight: 167 lbs Today's date: 08/03/2020 Total lbs lost to date: 12 Total lbs lost since last in-office visit: 4  Interim History: Mandy [name] continues to do well with weight loss. Her hunger is controlled but she is getting a bit bored with some of her Mandy choices.  Subjective:   1. Dehydration Mandy [name] notes feeling some episodes of lightheadedness, often in the morning after exercising. She does not drink much water and her blood pressure is stable today and not too low.  Assessment/Plan:   1. Dehydration Mandy [name] is to increase her water intake to 80 oz per day, and will follow up in 2-3 weeks.  2. Class 1 obesity with serious comorbidity and body mass index (BMI) of 30.0 to 30.9 in adult, unspecified obesity type Mandy [name] is currently in the action stage of change. As such, her goal is to continue with weight loss efforts. She has agreed to the Category 2 Plan.   Mandy recipes and low calorie desert recipes were given today.  Exercise goals: As is.  Behavioral modification strategies: increasing lean protein intake, increasing water intake and holiday eating strategies .  Mandy [name] has agreed to follow-up with our clinic in 2 weeks. She was informed of the importance of frequent follow-up visits to maximize her success with intensive lifestyle modifications for her multiple health conditions.   Objective:   Blood pressure 130/72, pulse (!) 58, temperature 97.6 F (36.4 C), height 5\' 2"  (1.575 m), weight 167 lb (75.8 kg), SpO2 99 %. Body mass  index is 30.54 kg/m.  General: Cooperative, alert, well developed, in no acute distress. HEENT: Conjunctivae and lids unremarkable. Cardiovascular: Regular rhythm.  Lungs: Normal work of breathing. Neurologic: No focal deficits.   Lab Results  Component Value Date   CREATININE 0.88 06/17/2020   BUN 22 06/17/2020   NA 139 06/17/2020   K 5.1 06/17/2020   CL 100 06/17/2020   CO2 26 06/17/2020   Lab Results  Component Value Date   ALT 13 06/17/2020   AST 19 06/17/2020   ALKPHOS 61 06/17/2020   BILITOT 0.4 06/17/2020   Lab Results  Component Value Date   HGBA1C 5.6 06/17/2020   Lab Results  Component Value Date   INSULIN 8.6 06/17/2020   Lab Results  Component Value Date   TSH 2.370 06/17/2020   Lab Results  Component Value Date   CHOL 192 06/17/2020   HDL 61 06/17/2020   LDLCALC 106 (H) 06/17/2020   LDLDIRECT 113.4 11/12/2012   TRIG 144 06/17/2020   CHOLHDL 3.1 06/17/2020   Lab Results  Component Value Date   WBC 5.0 06/17/2020   HGB 14.3 06/17/2020   HCT 44.0 06/17/2020   MCV 100 (H) 06/17/2020   PLT 329 06/17/2020   No results found for: IRON, TIBC, FERRITIN  Attestation Statements:   Reviewed by clinician on day of visit: allergies, medications, problem list, medical history, surgical history, family history, social history, and previous encounter notes.  Time spent on visit including pre-visit chart review and  post-visit care and charting was 30 minutes.    I, Trixie Dredge, am acting as transcriptionist for Dennard Nip, MD.  I have reviewed the above documentation for accuracy and completeness, and I agree with the above. -  Dennard Nip, MD

## 2020-08-12 ENCOUNTER — Other Ambulatory Visit: Payer: Self-pay | Admitting: Family Medicine

## 2020-08-17 ENCOUNTER — Other Ambulatory Visit: Payer: Self-pay

## 2020-08-17 ENCOUNTER — Encounter (INDEPENDENT_AMBULATORY_CARE_PROVIDER_SITE_OTHER): Payer: Self-pay | Admitting: Family Medicine

## 2020-08-17 ENCOUNTER — Ambulatory Visit (INDEPENDENT_AMBULATORY_CARE_PROVIDER_SITE_OTHER): Payer: Medicare HMO | Admitting: Family Medicine

## 2020-08-17 VITALS — BP 118/76 | HR 61 | Temp 97.5°F | Ht 62.0 in | Wt 165.0 lb

## 2020-08-17 DIAGNOSIS — Z6830 Body mass index (BMI) 30.0-30.9, adult: Secondary | ICD-10-CM | POA: Diagnosis not present

## 2020-08-17 DIAGNOSIS — E669 Obesity, unspecified: Secondary | ICD-10-CM

## 2020-08-17 DIAGNOSIS — I10 Essential (primary) hypertension: Secondary | ICD-10-CM

## 2020-08-18 NOTE — Progress Notes (Signed)
Chief Complaint:   OBESITY Mandy [name] is here to discuss her progress with her obesity treatment plan along with follow-up of her obesity related diagnoses. Mandy [name] is on the Category 2 Plan and states she is following her eating plan approximately 95% of the time. Mandy [name] states she is exercising with a trainer and walking for 60 minutes 2-7 times per week.  Today's visit was #: 5 Starting weight: 179 lbs Starting date: 06/17/2020 Today's weight: 165 lbs Today's date: 08/17/2020 Total lbs lost to date: 14 Total lbs lost since last in-office visit: 2  Interim History: Mandy [name] continues to do well with weight loss. She is exercising most days of the week. She still notes cravings at times but these appear more controlled than they used to.  Subjective:   1. Essential hypertension Mandy [name] is stable on Dyazide, she has worked on increasing her water, and denies any further feelings of lightheadedness.  Assessment/Plan:   1. Essential hypertension Mandy [name] will continue her medications, healthy weight loss, diet, and exercise to improve blood pressure control. We will watch for signs of hypotension as she continues her lifestyle modifications.  2. Class 1 obesity with serious comorbidity and body mass index (BMI) of 30.0 to 30.9 in adult, unspecified obesity type Mandy [name] is currently in the action stage of change. As such, her goal is to continue with weight loss efforts. She has agreed to the Category 2 Plan.   Exercise goals: As is.  Behavioral modification strategies: increasing lean protein intake, decreasing simple carbohydrates and holiday eating strategies .  Mandy [name] has agreed to follow-up with our clinic in 2 weeks. She was informed of the importance of frequent follow-up visits to maximize her success with intensive lifestyle modifications for her multiple health conditions.   Objective:   Blood pressure 118/76, pulse 61, temperature (!) 97.5 F (36.4 C), height 5\' 2"  (1.575 m), weight 165 lb (74.8 kg),  SpO2 97 %. Body mass index is 30.18 kg/m.  General: Cooperative, alert, well developed, in no acute distress. HEENT: Conjunctivae and lids unremarkable. Cardiovascular: Regular rhythm.  Lungs: Normal work of breathing. Neurologic: No focal deficits.   Lab Results  Component Value Date   CREATININE 0.88 06/17/2020   BUN 22 06/17/2020   NA 139 06/17/2020   K 5.1 06/17/2020   CL 100 06/17/2020   CO2 26 06/17/2020   Lab Results  Component Value Date   ALT 13 06/17/2020   AST 19 06/17/2020   ALKPHOS 61 06/17/2020   BILITOT 0.4 06/17/2020   Lab Results  Component Value Date   HGBA1C 5.6 06/17/2020   Lab Results  Component Value Date   INSULIN 8.6 06/17/2020   Lab Results  Component Value Date   TSH 2.370 06/17/2020   Lab Results  Component Value Date   CHOL 192 06/17/2020   HDL 61 06/17/2020   LDLCALC 106 (H) 06/17/2020   LDLDIRECT 113.4 11/12/2012   TRIG 144 06/17/2020   CHOLHDL 3.1 06/17/2020   Lab Results  Component Value Date   WBC 5.0 06/17/2020   HGB 14.3 06/17/2020   HCT 44.0 06/17/2020   MCV 100 (H) 06/17/2020   PLT 329 06/17/2020   No results found for: IRON, TIBC, FERRITIN  Attestation Statements:   Reviewed by clinician on day of visit: allergies, medications, problem list, medical history, surgical history, family history, social history, and previous encounter notes.  Time spent on visit including pre-visit chart review and post-visit care and charting was 32 minutes.  I, Trixie Dredge, am acting as transcriptionist for Dennard Nip, MD.  I have reviewed the above documentation for accuracy and completeness, and I agree with the above. -  Dennard Nip, MD

## 2020-08-25 DIAGNOSIS — G4733 Obstructive sleep apnea (adult) (pediatric): Secondary | ICD-10-CM | POA: Diagnosis not present

## 2020-08-31 ENCOUNTER — Encounter (INDEPENDENT_AMBULATORY_CARE_PROVIDER_SITE_OTHER): Payer: Self-pay | Admitting: Family Medicine

## 2020-08-31 ENCOUNTER — Ambulatory Visit (INDEPENDENT_AMBULATORY_CARE_PROVIDER_SITE_OTHER): Payer: Medicare HMO | Admitting: Family Medicine

## 2020-08-31 ENCOUNTER — Other Ambulatory Visit: Payer: Self-pay

## 2020-08-31 VITALS — BP 117/72 | HR 57 | Temp 97.6°F | Ht 62.0 in | Wt 164.0 lb

## 2020-08-31 DIAGNOSIS — I10 Essential (primary) hypertension: Secondary | ICD-10-CM

## 2020-08-31 DIAGNOSIS — Z6830 Body mass index (BMI) 30.0-30.9, adult: Secondary | ICD-10-CM

## 2020-08-31 DIAGNOSIS — E669 Obesity, unspecified: Secondary | ICD-10-CM

## 2020-09-01 NOTE — Progress Notes (Signed)
Chief Complaint:   OBESITY Mandy [name] is here to discuss her progress with her obesity treatment plan along with follow-up of her obesity related diagnoses. Mandy [name] is on the Category 2 Plan and states she is following her eating plan approximately 95% of the time. Mandy [name] states she is walking and exercising with a personal trainer for 30-40 minutes 4 times per week.  Today's visit was #: 6 Starting weight: 179 lbs Starting date: 06/17/2020 Today's weight: 164 lbs Today's date: 08/31/2020 Total lbs lost to date: 15 Total lbs lost since last in-office visit: 1  Interim History: Mandy [name] continues to do well with weight loss and exercise. She will be doing sitting for her daughter over Thanksgiving and she doesn't feel this will be a big challenge for her. Her hunger is mostly controlled.  Subjective:   1. Essential hypertension Mandy [name]'s blood pressure is well controlled on her medications and with weight loss. She shows no signs of hypotension currently.  Assessment/Plan:   1. Essential hypertension Mandy [name] is working on healthy weight loss, diet, and exercise to improve blood pressure control. We will watch for signs of hypotension as she continues her lifestyle modifications. Mandy [name] was educated on signs of hypotension.  2. Class 1 obesity with serious comorbidity and body mass index (BMI) of 30.0 to 30.9 in adult, unspecified obesity type Mandy [name] is currently in the action stage of change. As such, her goal is to continue with weight loss efforts. She has agreed to the Category 2 Plan.   Exercise goals: As is.  Behavioral modification strategies: increasing lean protein intake, increasing water intake and holiday eating strategies .  Mandy [name] has agreed to follow-up with our clinic in 2 to 3 weeks. She was informed of the importance of frequent follow-up visits to maximize her success with intensive lifestyle modifications for her multiple health conditions.   Objective:   Blood pressure 117/72, pulse (!) 57,  temperature 97.6 F (36.4 C), height 5\' 2"  (1.575 m), weight 164 lb (74.4 kg), SpO2 96 %. Body mass index is 30 kg/m.  General: Cooperative, alert, well developed, in no acute distress. HEENT: Conjunctivae and lids unremarkable. Cardiovascular: Regular rhythm.  Lungs: Normal work of breathing. Neurologic: No focal deficits.   Lab Results  Component Value Date   CREATININE 0.88 06/17/2020   BUN 22 06/17/2020   NA 139 06/17/2020   K 5.1 06/17/2020   CL 100 06/17/2020   CO2 26 06/17/2020   Lab Results  Component Value Date   ALT 13 06/17/2020   AST 19 06/17/2020   ALKPHOS 61 06/17/2020   BILITOT 0.4 06/17/2020   Lab Results  Component Value Date   HGBA1C 5.6 06/17/2020   Lab Results  Component Value Date   INSULIN 8.6 06/17/2020   Lab Results  Component Value Date   TSH 2.370 06/17/2020   Lab Results  Component Value Date   CHOL 192 06/17/2020   HDL 61 06/17/2020   LDLCALC 106 (H) 06/17/2020   LDLDIRECT 113.4 11/12/2012   TRIG 144 06/17/2020   CHOLHDL 3.1 06/17/2020   Lab Results  Component Value Date   WBC 5.0 06/17/2020   HGB 14.3 06/17/2020   HCT 44.0 06/17/2020   MCV 100 (H) 06/17/2020   PLT 329 06/17/2020   No results found for: IRON, TIBC, FERRITIN  Attestation Statements:   Reviewed by clinician on day of visit: allergies, medications, problem list, medical history, surgical history, family history, social history, and previous encounter notes.  Time spent  on visit including pre-visit chart review and post-visit care and charting was 20 minutes.    I, Trixie Dredge, am acting as transcriptionist for Dennard Nip, MD.  I have reviewed the above documentation for accuracy and completeness, and I agree with the above. -  Dennard Nip, MD

## 2020-09-14 ENCOUNTER — Ambulatory Visit (INDEPENDENT_AMBULATORY_CARE_PROVIDER_SITE_OTHER): Payer: Medicare HMO | Admitting: Family Medicine

## 2020-09-14 ENCOUNTER — Other Ambulatory Visit: Payer: Self-pay

## 2020-09-14 ENCOUNTER — Encounter (INDEPENDENT_AMBULATORY_CARE_PROVIDER_SITE_OTHER): Payer: Self-pay | Admitting: Family Medicine

## 2020-09-14 VITALS — BP 123/75 | HR 66 | Temp 97.6°F | Ht 62.0 in | Wt 162.0 lb

## 2020-09-14 DIAGNOSIS — E669 Obesity, unspecified: Secondary | ICD-10-CM | POA: Diagnosis not present

## 2020-09-14 DIAGNOSIS — Z6830 Body mass index (BMI) 30.0-30.9, adult: Secondary | ICD-10-CM

## 2020-09-14 DIAGNOSIS — I10 Essential (primary) hypertension: Secondary | ICD-10-CM

## 2020-09-20 NOTE — Progress Notes (Signed)
Chief Complaint:   OBESITY Maleeka is here to discuss her progress with her obesity treatment plan along with follow-up of her obesity related diagnoses. Annaston is on the Category 2 Plan and states she is following her eating plan approximately 90% of the time. Naileah states she is walking and exercising with a personal trainer for 60-90 minutes 7 times per week.  Today's visit was #: 7 Starting weight: 179 lbs Starting date: 06/17/2020 Today's weight: 162 lbs Today's date: 09/14/2020 Total lbs lost to date: 17 Total lbs lost since last in-office visit: 2  Interim History: Bedelia has done well avoiding weight gain over Thanksgiving. She is exercising regularly with a Physiological scientist.  Subjective:   1. Essential hypertension Minervia's blood pressure is well controlled on her medications and with diet, exercise, and weight loss. She denies signs of hypotension.  Assessment/Plan:   1. Essential hypertension Jasie is working on healthy weight loss, diet, and exercise to improve blood pressure control. We will watch for signs of hypotension as she continues her lifestyle modifications.  2. Class 1 obesity with serious comorbidity and body mass index (BMI) of 30.0 to 30.9 in adult, unspecified obesity type Hazelene is currently in the action stage of change. As such, her goal is to continue with weight loss efforts. She has agreed to the Category 2 Plan with breakfast options.   Exercise goals: As is.  Behavioral modification strategies: no skipping meals.  Bionca has agreed to follow-up with our clinic in 3 weeks. She was informed of the importance of frequent follow-up visits to maximize her success with intensive lifestyle modifications for her multiple health conditions.   Objective:   Blood pressure 123/75, pulse 66, temperature 97.6 F (36.4 C), height 5\' 2"  (1.575 m), weight 162 lb (73.5 kg), SpO2 96 %. Body mass index is 29.63 kg/m.  General: Cooperative, alert, well developed, in no acute  distress. HEENT: Conjunctivae and lids unremarkable. Cardiovascular: Regular rhythm.  Lungs: Normal work of breathing. Neurologic: No focal deficits.   Lab Results  Component Value Date   CREATININE 0.88 06/17/2020   BUN 22 06/17/2020   NA 139 06/17/2020   K 5.1 06/17/2020   CL 100 06/17/2020   CO2 26 06/17/2020   Lab Results  Component Value Date   ALT 13 06/17/2020   AST 19 06/17/2020   ALKPHOS 61 06/17/2020   BILITOT 0.4 06/17/2020   Lab Results  Component Value Date   HGBA1C 5.6 06/17/2020   Lab Results  Component Value Date   INSULIN 8.6 06/17/2020   Lab Results  Component Value Date   TSH 2.370 06/17/2020   Lab Results  Component Value Date   CHOL 192 06/17/2020   HDL 61 06/17/2020   LDLCALC 106 (H) 06/17/2020   LDLDIRECT 113.4 11/12/2012   TRIG 144 06/17/2020   CHOLHDL 3.1 06/17/2020   Lab Results  Component Value Date   WBC 5.0 06/17/2020   HGB 14.3 06/17/2020   HCT 44.0 06/17/2020   MCV 100 (H) 06/17/2020   PLT 329 06/17/2020   No results found for: IRON, TIBC, FERRITIN  Attestation Statements:   Reviewed by clinician on day of visit: allergies, medications, problem list, medical history, surgical history, family history, social history, and previous encounter notes.  Time spent on visit including pre-visit chart review and post-visit care and charting was 32 minutes.    Wilhemena Durie, am acting as transcriptionist for Dennard Nip, MD.  I have reviewed the above documentation  for accuracy and completeness, and I agree with the above. -  Dennard Nip, MD

## 2020-10-05 ENCOUNTER — Other Ambulatory Visit: Payer: Self-pay

## 2020-10-05 ENCOUNTER — Ambulatory Visit (INDEPENDENT_AMBULATORY_CARE_PROVIDER_SITE_OTHER): Payer: Medicare HMO | Admitting: Family Medicine

## 2020-10-05 ENCOUNTER — Encounter (INDEPENDENT_AMBULATORY_CARE_PROVIDER_SITE_OTHER): Payer: Self-pay | Admitting: Family Medicine

## 2020-10-05 VITALS — BP 127/77 | HR 60 | Temp 98.3°F | Ht 62.0 in | Wt 161.0 lb

## 2020-10-05 DIAGNOSIS — E559 Vitamin D deficiency, unspecified: Secondary | ICD-10-CM

## 2020-10-05 DIAGNOSIS — Z6830 Body mass index (BMI) 30.0-30.9, adult: Secondary | ICD-10-CM | POA: Diagnosis not present

## 2020-10-05 DIAGNOSIS — G4733 Obstructive sleep apnea (adult) (pediatric): Secondary | ICD-10-CM | POA: Diagnosis not present

## 2020-10-05 DIAGNOSIS — E669 Obesity, unspecified: Secondary | ICD-10-CM | POA: Diagnosis not present

## 2020-10-06 NOTE — Progress Notes (Signed)
Chief Complaint:   OBESITY Mandy [name] is here to discuss her progress with her obesity treatment plan along with follow-up of her obesity related diagnoses. Mandy [name] is on the Category 2 Plan with breakfast options and states she is following her eating plan approximately 95% of the time. Mandy [name] states she is walking and exercising with a personal trainer for 60 minutes 2-7 times per week.  Today's visit was #: 8 Starting weight: 179 lbs Starting date: 06/17/2020 Today's weight: 161 lbs Today's date: 10/05/2020 Total lbs lost to date: 18 Total lbs lost since last in-office visit: 1  Interim History: Aspynn continues to do well with weight loss. She will be spending time with family over Christmas, but she feels she will be able to eat reasonably healthy even then.  Subjective:   1. Vitamin D deficiency Mandy [name] is on Ca+ plus Vit D OTC, and she is working on increasing strengthening exercise to help build muscle and prevent osteoporosis.  Assessment/Plan:   1. Vitamin D deficiency Low Vitamin D level contributes to fatigue and are associated with obesity, breast, and colon cancer. Cannon agreed to continue taking Vitamin D plus Ca+ OTC and will follow-up for routine testing of Vitamin D, at least 2-3 times per year to avoid over-replacement.  2. Class 1 obesity with serious comorbidity and body mass index (BMI) of 30.0 to 30.9 in adult, unspecified obesity type Mandy [name] is currently in the action stage of change. As such, her goal is to continue with weight loss efforts. She has agreed to the Category 2 Plan.   Exercise goals: As is.  Behavioral modification strategies: meal planning and cooking strategies.  Mandy [name] has agreed to follow-up with our clinic in 4 weeks. She was informed of the importance of frequent follow-up visits to maximize her success with intensive lifestyle modifications for her multiple health conditions.   Objective:   Blood pressure 127/77, pulse 60, temperature 98.3 F (36.8 C),  height 5\' 2"  (1.575 m), weight 161 lb (73 kg), SpO2 98 %. Body mass index is 29.45 kg/m.  General: Cooperative, alert, well developed, in no acute distress. HEENT: Conjunctivae and lids unremarkable. Cardiovascular: Regular rhythm.  Lungs: Normal work of breathing. Neurologic: No focal deficits.   Lab Results  Component Value Date   CREATININE 0.88 06/17/2020   BUN 22 06/17/2020   NA 139 06/17/2020   K 5.1 06/17/2020   CL 100 06/17/2020   CO2 26 06/17/2020   Lab Results  Component Value Date   ALT 13 06/17/2020   AST 19 06/17/2020   ALKPHOS 61 06/17/2020   BILITOT 0.4 06/17/2020   Lab Results  Component Value Date   HGBA1C 5.6 06/17/2020   Lab Results  Component Value Date   INSULIN 8.6 06/17/2020   Lab Results  Component Value Date   TSH 2.370 06/17/2020   Lab Results  Component Value Date   CHOL 192 06/17/2020   HDL 61 06/17/2020   LDLCALC 106 (H) 06/17/2020   LDLDIRECT 113.4 11/12/2012   TRIG 144 06/17/2020   CHOLHDL 3.1 06/17/2020   Lab Results  Component Value Date   WBC 5.0 06/17/2020   HGB 14.3 06/17/2020   HCT 44.0 06/17/2020   MCV 100 (H) 06/17/2020   PLT 329 06/17/2020   No results found for: IRON, TIBC, FERRITIN  Attestation Statements:   Reviewed by clinician on day of visit: allergies, medications, problem list, medical history, surgical history, family history, social history, and previous encounter notes.  Time spent  on visit including pre-visit chart review and post-visit care and charting was 32 minutes.    I, Trixie Dredge, am acting as transcriptionist for Dennard Nip, MD.  I have reviewed the above documentation for accuracy and completeness, and I agree with the above. -  Dennard Nip, MD

## 2020-10-26 ENCOUNTER — Encounter (INDEPENDENT_AMBULATORY_CARE_PROVIDER_SITE_OTHER): Payer: Self-pay | Admitting: Family Medicine

## 2020-10-26 ENCOUNTER — Other Ambulatory Visit: Payer: Self-pay

## 2020-10-26 ENCOUNTER — Ambulatory Visit (INDEPENDENT_AMBULATORY_CARE_PROVIDER_SITE_OTHER): Payer: Medicare HMO | Admitting: Family Medicine

## 2020-10-26 VITALS — BP 134/76 | HR 59 | Temp 97.8°F | Ht 62.0 in | Wt 158.0 lb

## 2020-10-26 DIAGNOSIS — E669 Obesity, unspecified: Secondary | ICD-10-CM | POA: Diagnosis not present

## 2020-10-26 DIAGNOSIS — Z6830 Body mass index (BMI) 30.0-30.9, adult: Secondary | ICD-10-CM

## 2020-10-26 DIAGNOSIS — I10 Essential (primary) hypertension: Secondary | ICD-10-CM | POA: Diagnosis not present

## 2020-10-27 NOTE — Progress Notes (Signed)
Chief Complaint:   OBESITY Delmi is here to discuss her progress with her obesity treatment plan along with follow-up of her obesity related diagnoses. Malvika is on the Category 2 Plan and states she is following her eating plan approximately 90% of the time. Inas states she is doing strengthening for 30 minutes 2 times per week, and walking for 60-70 minutes 7 times per week.  Today's visit was #: 9 Starting weight: 161 lbs Starting date: 06/17/2020 Today's weight: 158 lbs Today's date: 10/26/2020 Total lbs lost to date: 3 Total lbs lost since last in-office visit: 3  Interim History: Mylena continues to well with weight loss even over the holidays. She feels her hunger is mostly controlled. She is exercising regularly and planning on starting yoga soon.  Subjective:   1. Essential hypertension Alyssabeth's blood pressure is well controlled on her medications, and she is doing very well on her diet and with weight loss. She denies symptoms of hypotension.  Assessment/Plan:   1. Essential hypertension Dannie will continue diet, and healthy weight loss to improve blood pressure control. She will watch for signs of hypotension as she continues her lifestyle modifications. We will continue to monitor closely.  2. Class 1 obesity with serious comorbidity and body mass index (BMI) of 30.0 to 30.9 in adult, unspecified obesity type Doloras is currently in the action stage of change. As such, her goal is to continue with weight loss efforts. She has agreed to the Category 2 Plan.   Exercise goals: As is.  Behavioral modification strategies: increasing lean protein intake and increasing water intake.  Eulla has agreed to follow-up with our clinic in 3 weeks. She was informed of the importance of frequent follow-up visits to maximize her success with intensive lifestyle modifications for her multiple health conditions.   Objective:   Blood pressure 134/76, pulse (!) 59, temperature 97.8 F (36.6 C),  temperature source Oral, height 5\' 2"  (1.575 m), weight 158 lb (71.7 kg), SpO2 97 %. Body mass index is 28.9 kg/m.  General: Cooperative, alert, well developed, in no acute distress. HEENT: Conjunctivae and lids unremarkable. Cardiovascular: Regular rhythm.  Lungs: Normal work of breathing. Neurologic: No focal deficits.   Lab Results  Component Value Date   CREATININE 0.88 06/17/2020   BUN 22 06/17/2020   NA 139 06/17/2020   K 5.1 06/17/2020   CL 100 06/17/2020   CO2 26 06/17/2020   Lab Results  Component Value Date   ALT 13 06/17/2020   AST 19 06/17/2020   ALKPHOS 61 06/17/2020   BILITOT 0.4 06/17/2020   Lab Results  Component Value Date   HGBA1C 5.6 06/17/2020   Lab Results  Component Value Date   INSULIN 8.6 06/17/2020   Lab Results  Component Value Date   TSH 2.370 06/17/2020   Lab Results  Component Value Date   CHOL 192 06/17/2020   HDL 61 06/17/2020   LDLCALC 106 (H) 06/17/2020   LDLDIRECT 113.4 11/12/2012   TRIG 144 06/17/2020   CHOLHDL 3.1 06/17/2020   Lab Results  Component Value Date   WBC 5.0 06/17/2020   HGB 14.3 06/17/2020   HCT 44.0 06/17/2020   MCV 100 (H) 06/17/2020   PLT 329 06/17/2020   No results found for: IRON, TIBC, FERRITIN  Attestation Statements:   Reviewed by clinician on day of visit: allergies, medications, problem list, medical history, surgical history, family history, social history, and previous encounter notes.  Time spent on visit including pre-visit  chart review and post-visit care and charting was 22 minutes.    I, Trixie Dredge, am acting as transcriptionist for Dennard Nip, MD.  I have reviewed the above documentation for accuracy and completeness, and I agree with the above. -  Dennard Nip, MD

## 2020-11-09 ENCOUNTER — Ambulatory Visit (INDEPENDENT_AMBULATORY_CARE_PROVIDER_SITE_OTHER): Payer: Medicare HMO | Admitting: Family Medicine

## 2020-11-09 ENCOUNTER — Other Ambulatory Visit: Payer: Self-pay

## 2020-11-09 ENCOUNTER — Encounter (INDEPENDENT_AMBULATORY_CARE_PROVIDER_SITE_OTHER): Payer: Self-pay | Admitting: Family Medicine

## 2020-11-09 VITALS — BP 124/79 | HR 67 | Temp 97.7°F | Ht 62.0 in | Wt 159.0 lb

## 2020-11-09 DIAGNOSIS — Z6830 Body mass index (BMI) 30.0-30.9, adult: Secondary | ICD-10-CM

## 2020-11-09 DIAGNOSIS — E7849 Other hyperlipidemia: Secondary | ICD-10-CM

## 2020-11-09 DIAGNOSIS — E669 Obesity, unspecified: Secondary | ICD-10-CM

## 2020-11-09 NOTE — Progress Notes (Signed)
Chief Complaint:   OBESITY Mandy [name] is here to discuss her progress with her obesity treatment plan along with follow-up of her obesity related diagnoses. Mandy [name] is on the Category 2 Plan and states she is following her eating plan approximately 85-90% of the time. Mandy [name] states she is exercising with a personal trainer 2 times per week, and walking for 60 minutes 5 times per week.  Today's visit was #: 10 Starting weight: 161 lbs Starting date: 06/17/2020 Today's weight: 159 lbs Today's date: 11/09/2020 Total lbs lost to date: 2 Total lbs lost since last in-office visit: 0  Interim History: Mandy [name] has done well maintaining her weight even while dog sitting her granddog in the snow storm. She continues to exercise well and she feels she will be able to be more structured in the next 3 weeks.  Subjective:   1. Other hyperlipidemia Mandy [name] continues to do well with diet and exercise, and she is still stable on Zetia.  Assessment/Plan:   1. Other hyperlipidemia Cardiovascular risk and specific lipid/LDL goals reviewed. We discussed several lifestyle modifications today. Mandy [name] will continue to work on diet, exercise and weight loss efforts. We will recheck labs in 1-2 months. Orders and follow up as documented in patient record.   2. Class 1 obesity with serious comorbidity and body mass index (BMI) of 30.0 to 30.9 in adult, unspecified obesity type Mandy [name] is currently in the action stage of change. As such, her goal is to continue with weight loss efforts. She has agreed to the Category 2 Plan.   Exercise goals: As is.  Behavioral modification strategies: meal planning and cooking strategies.  Mandy [name] has agreed to follow-up with our clinic in 3 weeks. She was informed of the importance of frequent follow-up visits to maximize her success with intensive lifestyle modifications for her multiple health conditions.   Objective:   Blood pressure 124/79, pulse 67, temperature 97.7 F (36.5 C), height 5\' 2"   (1.575 m), weight 159 lb (72.1 kg), SpO2 94 %. Body mass index is 29.08 kg/m.  General: Cooperative, alert, well developed, in no acute distress. HEENT: Conjunctivae and lids unremarkable. Cardiovascular: Regular rhythm.  Lungs: Normal work of breathing. Neurologic: No focal deficits.   Lab Results  Component Value Date   CREATININE 0.88 06/17/2020   BUN 22 06/17/2020   NA 139 06/17/2020   K 5.1 06/17/2020   CL 100 06/17/2020   CO2 26 06/17/2020   Lab Results  Component Value Date   ALT 13 06/17/2020   AST 19 06/17/2020   ALKPHOS 61 06/17/2020   BILITOT 0.4 06/17/2020   Lab Results  Component Value Date   HGBA1C 5.6 06/17/2020   Lab Results  Component Value Date   INSULIN 8.6 06/17/2020   Lab Results  Component Value Date   TSH 2.370 06/17/2020   Lab Results  Component Value Date   CHOL 192 06/17/2020   HDL 61 06/17/2020   LDLCALC 106 (H) 06/17/2020   LDLDIRECT 113.4 11/12/2012   TRIG 144 06/17/2020   CHOLHDL 3.1 06/17/2020   Lab Results  Component Value Date   WBC 5.0 06/17/2020   HGB 14.3 06/17/2020   HCT 44.0 06/17/2020   MCV 100 (H) 06/17/2020   PLT 329 06/17/2020   No results found for: IRON, TIBC, FERRITIN  Attestation Statements:   Reviewed by clinician on day of visit: allergies, medications, problem list, medical history, surgical history, family history, social history, and previous encounter notes.  Time spent on visit  including pre-visit chart review and post-visit care and charting was 20 minutes.    I, Trixie Dredge, am acting as transcriptionist for Dennard Nip, MD.  I have reviewed the above documentation for accuracy and completeness, and I agree with the above. -  Dennard Nip, MD

## 2020-11-30 ENCOUNTER — Other Ambulatory Visit: Payer: Self-pay | Admitting: Family Medicine

## 2020-11-30 ENCOUNTER — Encounter (INDEPENDENT_AMBULATORY_CARE_PROVIDER_SITE_OTHER): Payer: Self-pay | Admitting: Family Medicine

## 2020-11-30 ENCOUNTER — Ambulatory Visit (INDEPENDENT_AMBULATORY_CARE_PROVIDER_SITE_OTHER): Payer: Medicare HMO | Admitting: Family Medicine

## 2020-11-30 ENCOUNTER — Other Ambulatory Visit: Payer: Self-pay

## 2020-11-30 VITALS — BP 147/83 | HR 52 | Temp 97.5°F | Ht 62.0 in | Wt 161.0 lb

## 2020-11-30 DIAGNOSIS — E669 Obesity, unspecified: Secondary | ICD-10-CM | POA: Diagnosis not present

## 2020-11-30 DIAGNOSIS — I10 Essential (primary) hypertension: Secondary | ICD-10-CM

## 2020-11-30 DIAGNOSIS — Z6830 Body mass index (BMI) 30.0-30.9, adult: Secondary | ICD-10-CM | POA: Diagnosis not present

## 2020-12-01 NOTE — Progress Notes (Signed)
Chief Complaint:   OBESITY Mandy [name] is here to discuss her progress with her obesity treatment plan along with follow-up of her obesity related diagnoses. Mandy [name] is on the Category 2 Plan and states she is following her eating plan approximately 85% of the time. Mandy [name] states she is walking BID 7 times per week, and exercising with a personal trainer for 30 minutes 2 times per week.  Today's visit was #: 11 Starting weight: 161 lbs Starting date: 06/17/2020 Today's weight: 161 lbs Today's date: 11/30/2020 Total lbs lost to date: 0 Total lbs lost since last in-office visit: 0  Interim History: Mandy [name] retaining a bit of fluid today after she did some celebration eating this weekend. She has done well overall with her plan and exercise is wonderful.  Subjective:   1. Essential hypertension Mandy [name]'s blood pressure is elevated today, normally very well controlled. She did some celebration eating with likely increased sodium over the weekend which may have contributed.  Assessment/Plan:   1. Essential hypertension Mandy [name] will continue diet and exercise to improve blood pressure control. We will recheck her blood pressure in 3 weeks. We will watch for signs of hypotension as she continues her lifestyle modifications.  2. Class 1 obesity with serious comorbidity and body mass index (BMI) of 30.0 to 30.9 in adult, unspecified obesity type Mandy [name] is currently in the action stage of change. As such, her goal is to continue with weight loss efforts. She has agreed to the Category 2 Plan.   Exercise goals: As is.  Behavioral modification strategies: increasing lean protein intake and meal planning and cooking strategies.  Mandy [name] has agreed to follow-up with our clinic in 3 to 4 weeks. She was informed of the importance of frequent follow-up visits to maximize her success with intensive lifestyle modifications for her multiple health conditions.   Objective:   Blood pressure (!) 147/83, pulse (!) 52, temperature  (!) 97.5 F (36.4 C), height 5\' 2"  (1.575 m), weight 161 lb (73 kg), SpO2 96 %. Body mass index is 29.45 kg/m.  General: Cooperative, alert, well developed, in no acute distress. HEENT: Conjunctivae and lids unremarkable. Cardiovascular: Regular rhythm.  Lungs: Normal work of breathing. Neurologic: No focal deficits.   Lab Results  Component Value Date   CREATININE 0.88 06/17/2020   BUN 22 06/17/2020   NA 139 06/17/2020   K 5.1 06/17/2020   CL 100 06/17/2020   CO2 26 06/17/2020   Lab Results  Component Value Date   ALT 13 06/17/2020   AST 19 06/17/2020   ALKPHOS 61 06/17/2020   BILITOT 0.4 06/17/2020   Lab Results  Component Value Date   HGBA1C 5.6 06/17/2020   Lab Results  Component Value Date   INSULIN 8.6 06/17/2020   Lab Results  Component Value Date   TSH 2.370 06/17/2020   Lab Results  Component Value Date   CHOL 192 06/17/2020   HDL 61 06/17/2020   LDLCALC 106 (H) 06/17/2020   LDLDIRECT 113.4 11/12/2012   TRIG 144 06/17/2020   CHOLHDL 3.1 06/17/2020   Lab Results  Component Value Date   WBC 5.0 06/17/2020   HGB 14.3 06/17/2020   HCT 44.0 06/17/2020   MCV 100 (H) 06/17/2020   PLT 329 06/17/2020   No results found for: IRON, TIBC, FERRITIN  Attestation Statements:   Reviewed by clinician on day of visit: allergies, medications, problem list, medical history, surgical history, family history, social history, and previous encounter notes.  Time spent  on visit including pre-visit chart review and post-visit care and charting was 35 minutes.    I, Trixie Dredge, am acting as transcriptionist for Dennard Nip, MD.  I have reviewed the above documentation for accuracy and completeness, and I agree with the above. -  Dennard Nip, MD

## 2020-12-03 ENCOUNTER — Ambulatory Visit: Payer: Medicare HMO | Admitting: Adult Health

## 2020-12-03 ENCOUNTER — Other Ambulatory Visit: Payer: Self-pay

## 2020-12-03 ENCOUNTER — Encounter: Payer: Self-pay | Admitting: Adult Health

## 2020-12-03 DIAGNOSIS — G4700 Insomnia, unspecified: Secondary | ICD-10-CM

## 2020-12-03 DIAGNOSIS — J310 Chronic rhinitis: Secondary | ICD-10-CM | POA: Diagnosis not present

## 2020-12-03 DIAGNOSIS — G4733 Obstructive sleep apnea (adult) (pediatric): Secondary | ICD-10-CM

## 2020-12-03 MED ORDER — AZELASTINE HCL 0.1 % NA SOLN: 1.0000 | Freq: Every day | NASAL | 5 refills | Status: DC | PRN

## 2020-12-03 NOTE — Patient Instructions (Addendum)
Continue on Claritin 10mg  daily  Add Chlorpheniramine 4mg  (Chlortab) At bedtime  As needed  Drainage  Saline nasal spray Twice daily   Saline nasal gel At bedtime  .  Add Flonase 1 puff daily in am  Add Astelin 1 puffs daily in pm .  Continue on CPAP At bedtime   Melatonin At bedtime  As needed  Insomnia .  Try to wear each night for at least 6hrs each night  Stay active , healthy diet  Do not drive if sleepy  Follow up with Dr. Halford Chessman or Rheannon Cerney NP  In 1 year and As needed

## 2020-12-03 NOTE — Progress Notes (Signed)
Reviewed and agree with assessment/plan.   Chesley Mires, MD [facility] Pulmonary/Critical Care 12/03/2020, 11:31 AM Pager:  [phone]

## 2020-12-03 NOTE — Assessment & Plan Note (Signed)
Add Chlortab 4 mg at bedtime.  Add Astelin nasal spray.  Continue on Flonase and Claritin.  Plan  Patient Instructions  Continue on Claritin 10mg  daily  Add Chlorpheniramine 4mg  (Chlortab) At bedtime  As needed  Drainage  Saline nasal spray Twice daily   Saline nasal gel At bedtime  .  Add Flonase 1 puff daily in am  Add Astelin 1 puffs daily in pm .  Continue on CPAP At bedtime   Melatonin At bedtime  As needed  Insomnia .  Try to wear each night for at least 6hrs each night  Stay active , healthy diet  Do not drive if sleepy  Follow up with Dr. Halford Chessman or Jafeth Mustin NP  In 1 year and As needed

## 2020-12-03 NOTE — Assessment & Plan Note (Signed)
Patient has good control and compliance on CPAP.  She does have a flare of some chronic rhinitis.  Have asked her to add in saline nasal spray and saline nasal gel to help with nasal stuffiness.  Also some nasal sprays to help with drippy nose.  Plan  Patient Instructions  Continue on Claritin 10mg  daily  Add Chlorpheniramine 4mg  (Chlortab) At bedtime  As needed  Drainage  Saline nasal spray Twice daily   Saline nasal gel At bedtime  .  Add Flonase 1 puff daily in am  Add Astelin 1 puffs daily in pm .  Continue on CPAP At bedtime   Melatonin At bedtime  As needed  Insomnia .  Try to wear each night for at least 6hrs each night  Stay active , healthy diet  Do not drive if sleepy  Follow up with Dr. Halford Chessman or Kristian Mogg NP  In 1 year and As needed

## 2020-12-03 NOTE — Progress Notes (Signed)
@Patient  ID: Mandy [name], female    DOB: January 17, 1946, 75 y.o.   MRN: [id number]  Chief Complaint  Patient presents with  . Follow-up    Referring provider: Mosie Lukes, MD  HPI: 75 year old female followed for obstructive sleep apnea on CPAP   TEST/EVENTS :  HST 10/26/17 >> AHI 17.4, SaO2 low 79% Auto CPAP 04/15/19 to 05/14/19 >>used on 19 of 30 nights with average 5 hrs 33 min. Average AHI 0.8 with median CPAP 7 and 95 th percentile CPAP 10 cm H2O  12/03/2020 Follow up : OSA  Patient presents for a follow-up visit. Patient has underlying moderate obstructive sleep apnea is on nocturnal CPAP. Patient says she wears her CPAP every night. Usually gets in about 5 hours. CPAP download shows excellent compliance with 97% usage. Daily average usage at 5 hours. Patient is on auto CPAP 5 to 15 cm H2O. AHI 0.5. She complains that she still not a fan of her CPAP and does not like the mask but does try to wear it because it makes her feel so much better. She complains of increased allergy symptoms over the last 2 weeks.  She has had increased drippy nose nasal drainage and sneezing over the last 2 weeks.  All mucus has been clear.  She has no sinus pain or pressure.  She has been taking Claritin without much help.  She is recently started Flonase in the last 2 days .  Denies any discolored mucus   Allergies  Allergen Reactions  . Codeine   . Pneumococcal Vaccine Polyvalent     REACTION: rash at injection site and induration.    Immunization History  Administered Date(s) Administered  . Fluad Quad(high Dose 65+) 07/12/2019, 07/13/2020  . Hepatitis B 04/13/2008, 06/15/2008, 12/17/2008  . Influenza Split 08/03/2011, 07/31/2012  . Influenza, High Dose Seasonal PF 07/23/2013, 07/30/2015, 07/14/2016, 07/26/2017, 07/19/2018  . Influenza,inj,Quad PF,6+ Mos 07/08/2014  . PFIZER(Purple Top)SARS-COV-2 Vaccination 11/21/2019, 12/15/2019  . Pneumococcal Conjugate-13 10/30/2013  . Pneumococcal  Polysaccharide-23 12/23/2009, 12/21/2015  . Td 05/11/2010  . Zoster 09/23/2008  . Zoster Recombinat (Shingrix) 06/27/2019    Past Medical History:  Diagnosis Date  . Atrophic vaginitis 11/18/2012  . Back pain   . Chicken pox as a child  . Cough 07/14/2016  . Dermatitis 04/26/2011  . GERD (gastroesophageal reflux disease) 03/20/2017  . High cholesterol   . HTN (hypertension) 04/13/2010   Qualifier: Diagnosis of  By: Charlett Blake MD, Erline Levine    . Hypertension   . Measles as a child  . Miscarriage 1981  . Onychomycosis 04/26/2011  . OSA (obstructive sleep apnea) 10/30/2017  . Osteopenia 10/17/1999  . Osteopenia 04/13/2010   Qualifier: Diagnosis of  By: Charlett Blake MD, Erline Levine    . Preventative health care 07/14/2016  . Screening for malignant neoplasm of the cervix 04/26/2011  . Sleep apnea   . Tinea pedis 05/10/2014  . Urinary frequency 03/20/2017    Tobacco History: Social History   Tobacco Use  Smoking Status Former Smoker  . Packs/day: 1.00  . Years: 40.00  . Pack years: 40.00  . Types: Cigarettes  . Quit date: 10/16/1994  . Years since quitting: 26.1  Smokeless Tobacco Never Used   Counseling given: Not Answered   Outpatient Medications Prior to Visit  Medication Sig Dispense Refill  . aspirin 81 MG tablet Take 81 mg by mouth daily.    Marland Kitchen b complex vitamins tablet Take 1 tablet by mouth daily.    . calcium citrate-vitamin  D (CITRACAL+D) 315-200 MG-UNIT per tablet Take 1 tablet by mouth 2 (two) times daily.    . Loperamide HCl (IMODIUM PO) Take 1 tablet by mouth daily.     . Methylsulfonylmethane (MSM PO) Take 2 capsules by mouth daily. 1,000 mg 3 x daily    . multivitamin (THERAGRAN) per tablet Take 1 tablet by mouth daily.    . mupirocin ointment (BACTROBAN) 2 % Place 1 application into the nose at bedtime as needed. 22 g 1  . Nutritional Supplements (VITAMIN D BOOSTER PO) Take by mouth.    . Omega-3 Fatty Acids-Vitamin E (COROMEGA PO) Take by mouth.    Marland Kitchen PREMARIN vaginal cream APPLY A  SMALL AMOUNT (APPROXIMATELY 0.25G)PER VAGINA 3 TIMES A WEEK AT BEDTIME. 30 g 0  . Probiotic Product (PROBIOTIC DAILY PO) Take by mouth daily.    . Red Yeast Rice Extract (RED YEAST RICE PO) Take by mouth.    . triamterene-hydrochlorothiazide (DYAZIDE) 37.5-25 MG capsule TAKE (1) CAPSULE DAILY. 90 capsule 0  . TURMERIC PO Take 1 capsule by mouth daily.     . vitamin C (ASCORBIC ACID) 500 MG tablet Take 500 mg by mouth daily.    . Zinc Acetate, Oral, (ZINC ACETATE PO) Take by mouth.    . ezetimibe (ZETIA) 10 MG tablet Take 1 tablet (10 mg total) by mouth daily. 90 tablet 3   No facility-administered medications prior to visit.     Review of Systems:   Constitutional:   No  weight loss, night sweats,  Fevers, chills, fatigue, or  lassitude.  HEENT:   No headaches,  Difficulty swallowing,  Tooth/dental problems, or  Sore throat,                No sneezing, itching, ear ache,  +nasal congestion, post nasal drip,   CV:  No chest pain,  Orthopnea, PND, swelling in lower extremities, anasarca, dizziness, palpitations, syncope.   GI  No heartburn, indigestion, abdominal pain, nausea, vomiting, diarrhea, change in bowel habits, loss of appetite, bloody stools.   Resp: No shortness of breath with exertion or at rest.  No excess mucus, no productive cough,  No non-productive cough,  No coughing up of blood.  No change in color of mucus.  No wheezing.  No chest wall deformity  Skin: no rash or lesions.  GU: no dysuria, change in color of urine, no urgency or frequency.  No flank pain, no hematuria   MS:  No joint pain or swelling.  No decreased range of motion.  No back pain.    Physical Exam  BP 124/78 (BP Location: Left Arm, Cuff Size: Normal)   Pulse 66   Temp (!) 97.5 F (36.4 C)   Ht 5\' 2"  (1.575 m)   Wt 166 lb (75.3 kg)   SpO2 96%   BMI 30.36 kg/m   GEN: A/Ox3; pleasant , NAD, well nourished    HEENT:  [place]/AT,   , NOSE-clear, THROAT-clear drainage no lesions, no postnasal  drip or exudate noted.   NECK:  Supple w/ fair ROM; no JVD; normal carotid impulses w/o bruits; no thyromegaly or nodules palpated; no lymphadenopathy.    RESP  Clear  P & A; w/o, wheezes/ rales/ or rhonchi. no accessory muscle use, no dullness to percussion  CARD:  RRR, no m/r/g, no peripheral edema, pulses intact, no cyanosis or clubbing.  GI:   Soft & nt; nml bowel sounds; no organomegaly or masses detected.   Musco: Warm bil, no deformities  or joint swelling noted.   Neuro: alert, no focal deficits noted.    Skin: Warm, no lesions or rashes    Lab Results:    BNP No results found for: BNP  ProBNP No results found for: PROBNP  Imaging: No results found.    No flowsheet data found.  No results found for: NITRICOXIDE      Assessment & Plan:   OSA (obstructive sleep apnea) Patient has good control and compliance on CPAP.  She does have a flare of some chronic rhinitis.  Have asked her to add in saline nasal spray and saline nasal gel to help with nasal stuffiness.  Also some nasal sprays to help with drippy nose.  Plan  Patient Instructions  Continue on Claritin 10mg  daily  Add Chlorpheniramine 4mg  (Chlortab) At bedtime  As needed  Drainage  Saline nasal spray Twice daily   Saline nasal gel At bedtime  .  Add Flonase 1 puff daily in am  Add Astelin 1 puffs daily in pm .  Continue on CPAP At bedtime   Melatonin At bedtime  As needed  Insomnia .  Try to wear each night for at least 6hrs each night  Stay active , healthy diet  Do not drive if sleepy  Follow up with Dr. Halford Chessman or Kyllian Clingerman NP  In 1 year and As needed       Chronic rhinitis Add Chlortab 4 mg at bedtime.  Add Astelin nasal spray.  Continue on Flonase and Claritin.  Plan  Patient Instructions  Continue on Claritin 10mg  daily  Add Chlorpheniramine 4mg  (Chlortab) At bedtime  As needed  Drainage  Saline nasal spray Twice daily   Saline nasal gel At bedtime  .  Add Flonase 1 puff daily in am   Add Astelin 1 puffs daily in pm .  Continue on CPAP At bedtime   Melatonin At bedtime  As needed  Insomnia .  Try to wear each night for at least 6hrs each night  Stay active , healthy diet  Do not drive if sleepy  Follow up with Dr. Halford Chessman or Ed Rayson NP  In 1 year and As needed       Frequent nocturnal awakening Mild insomnia.  Have encouraged her on healthy sleep regimen.  And to use melatonin     Rexene Edison, NP 12/03/2020

## 2020-12-03 NOTE — Assessment & Plan Note (Signed)
Mild insomnia.  Have encouraged her on healthy sleep regimen.  And to use melatonin

## 2020-12-20 ENCOUNTER — Ambulatory Visit (INDEPENDENT_AMBULATORY_CARE_PROVIDER_SITE_OTHER): Payer: Medicare HMO | Admitting: Family Medicine

## 2021-01-04 DIAGNOSIS — G4733 Obstructive sleep apnea (adult) (pediatric): Secondary | ICD-10-CM | POA: Diagnosis not present

## 2021-01-12 ENCOUNTER — Encounter (INDEPENDENT_AMBULATORY_CARE_PROVIDER_SITE_OTHER): Payer: Self-pay | Admitting: Family Medicine

## 2021-01-12 ENCOUNTER — Other Ambulatory Visit: Payer: Self-pay

## 2021-01-12 ENCOUNTER — Ambulatory Visit (INDEPENDENT_AMBULATORY_CARE_PROVIDER_SITE_OTHER): Payer: Medicare HMO | Admitting: Family Medicine

## 2021-01-12 VITALS — BP 149/85 | HR 53 | Temp 97.3°F | Ht 62.0 in | Wt 158.0 lb

## 2021-01-12 DIAGNOSIS — F3289 Other specified depressive episodes: Secondary | ICD-10-CM | POA: Diagnosis not present

## 2021-01-12 DIAGNOSIS — E669 Obesity, unspecified: Secondary | ICD-10-CM

## 2021-01-12 DIAGNOSIS — R69 Illness, unspecified: Secondary | ICD-10-CM | POA: Diagnosis not present

## 2021-01-12 DIAGNOSIS — Z6830 Body mass index (BMI) 30.0-30.9, adult: Secondary | ICD-10-CM

## 2021-01-19 NOTE — Progress Notes (Signed)
Chief Complaint:   OBESITY Mandy [name] is here to discuss her progress with her obesity treatment plan along with follow-up of her obesity related diagnoses. Mandy [name] is on the Category 2 Plan and states she is following her eating plan approximately 75% of the time. Mandy [name] states she is walking daily for 30-40 minutes, and doing yoga for 45 minutes 1 time per week.  Today's visit was #: 12 Starting weight: 161 lbs Starting date: 06/17/2020 Today's weight: 158 lbs Today's date: 01/12/2021 Total lbs lost to date: 3 Total lbs lost since last in-office visit: 3  Interim History: Mandy [name] continues to do well with weight loss. She is getting a bit bored with her plan and she is open to looking at other options.  Subjective:   1. Other depression with emotional eating Mandy [name] notes increased PM cravings and would like to discuss strategies to help deal with this.  Assessment/Plan:   1. Other depression with emotional eating Emotional eating behaviors were discussed with Mandy [name] today. We will follow up in 1 month. Orders and follow up as documented in patient record.   2. Class 1 obesity with serious comorbidity and body mass index (BMI) of 30.0 to 30.9 in adult, unspecified obesity type Mandy [name] is currently in the action stage of change. As such, her goal is to continue with weight loss efforts. She has agreed to the Category 2 Plan or following a lower carbohydrate, vegetable and lean protein rich diet plan.   Exercise goals: As is.  Behavioral modification strategies: increasing lean protein intake and emotional eating strategies.  Mandy [name] has agreed to follow-up with our clinic in 4 weeks. She was informed of the importance of frequent follow-up visits to maximize her success with intensive lifestyle modifications for her multiple health conditions.   Objective:   Blood pressure (!) 149/85, pulse (!) 53, temperature (!) 97.3 F (36.3 C), height 5\' 2"  (1.575 m), weight 158 lb (71.7 kg), SpO2 98 %. Body mass  index is 28.9 kg/m.  General: Cooperative, alert, well developed, in no acute distress. HEENT: Conjunctivae and lids unremarkable. Cardiovascular: Regular rhythm.  Lungs: Normal work of breathing. Neurologic: No focal deficits.   Lab Results  Component Value Date   CREATININE 0.88 06/17/2020   BUN 22 06/17/2020   NA 139 06/17/2020   K 5.1 06/17/2020   CL 100 06/17/2020   CO2 26 06/17/2020   Lab Results  Component Value Date   ALT 13 06/17/2020   AST 19 06/17/2020   ALKPHOS 61 06/17/2020   BILITOT 0.4 06/17/2020   Lab Results  Component Value Date   HGBA1C 5.6 06/17/2020   Lab Results  Component Value Date   INSULIN 8.6 06/17/2020   Lab Results  Component Value Date   TSH 2.370 06/17/2020   Lab Results  Component Value Date   CHOL 192 06/17/2020   HDL 61 06/17/2020   LDLCALC 106 (H) 06/17/2020   LDLDIRECT 113.4 11/12/2012   TRIG 144 06/17/2020   CHOLHDL 3.1 06/17/2020   Lab Results  Component Value Date   WBC 5.0 06/17/2020   HGB 14.3 06/17/2020   HCT 44.0 06/17/2020   MCV 100 (H) 06/17/2020   PLT 329 06/17/2020   No results found for: IRON, TIBC, FERRITIN  Attestation Statements:   Reviewed by clinician on day of visit: allergies, medications, problem list, medical history, surgical history, family history, social history, and previous encounter notes.  Time spent on visit including pre-visit chart review and post-visit care and  charting was 32 minutes.    I, Trixie Dredge, am acting as transcriptionist for Dennard Nip, MD.  I have reviewed the above documentation for accuracy and completeness, and I agree with the above. -  Dennard Nip, MD

## 2021-02-14 ENCOUNTER — Encounter (INDEPENDENT_AMBULATORY_CARE_PROVIDER_SITE_OTHER): Payer: Self-pay | Admitting: Family Medicine

## 2021-02-14 ENCOUNTER — Other Ambulatory Visit: Payer: Self-pay

## 2021-02-14 ENCOUNTER — Ambulatory Visit (INDEPENDENT_AMBULATORY_CARE_PROVIDER_SITE_OTHER): Payer: Medicare HMO | Admitting: Family Medicine

## 2021-02-14 VITALS — BP 142/74 | Temp 97.6°F | Ht 62.0 in | Wt 158.0 lb

## 2021-02-14 DIAGNOSIS — E669 Obesity, unspecified: Secondary | ICD-10-CM | POA: Diagnosis not present

## 2021-02-14 DIAGNOSIS — Z6832 Body mass index (BMI) 32.0-32.9, adult: Secondary | ICD-10-CM | POA: Diagnosis not present

## 2021-02-14 DIAGNOSIS — I10 Essential (primary) hypertension: Secondary | ICD-10-CM

## 2021-02-15 NOTE — Progress Notes (Signed)
Chief Complaint:   OBESITY Mandy [name] is here to discuss her progress with her obesity treatment plan along with follow-up of her obesity related diagnoses. Mandy [name] is on the Category 2 Plan or following a lower carbohydrate, vegetable and lean protein rich diet plan and states she is following her eating plan approximately 85% of the time. Mandy [name] states she is walking and exercising with a trainer for 60-70 minutes 2 times per week.  Today's visit was #: 13 Starting weight: 161 lbs Starting date: 06/17/2020 Today's weight: 158 lbs Today's date: 02/14/2021 Total lbs lost to date: 3 Total lbs lost since last in-office visit: 0  Interim History: Mandy [name] has done well maintaining her weight even with increased social eating. 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 She thinks she does better with the Category 2 plan.  Subjective:   1. Essential hypertension Mandy [name]'s blood pressure is borderline elevated. Her blood pressures at home mostly runs 120 over 70's with her wrist blood pressure cuff. She denies chest pain, headache, or lightheadedness.  Assessment/Plan:   1. Essential hypertension Mandy [name] will continue Dyazide, and diet and exercise to improve blood pressure control. We will continue to monitor as she continues her lifestyle modifications.  2. Obesity with current BMI of 32.74 Mandy [name] is currently in the action stage of change. As such, her goal is to continue with weight loss efforts. She has agreed to change to the Category 2 Plan.   Exercise goals: As is.  Behavioral modification strategies: increasing lean protein intake and meal planning and cooking strategies.  Mandy [name] has agreed to follow-up with our clinic in 4 weeks. She was informed of the importance of frequent follow-up visits to maximize her success with intensive lifestyle modifications for her multiple health conditions.   Objective:   Blood pressure (!) 142/74, temperature  97.6 F (36.4 C), height 5\' 2"  (1.575 m), weight 158 lb (71.7 kg). Body mass index is 28.9 kg/m.  General: Cooperative, alert, well developed, in no acute distress. HEENT: Conjunctivae and lids unremarkable. Cardiovascular: Regular rhythm.  Lungs: Normal work of breathing. Neurologic: No focal deficits.   Lab Results  Component Value Date   CREATININE 0.88 06/17/2020   BUN 22 06/17/2020   NA 139 06/17/2020   K 5.1 06/17/2020   CL 100 06/17/2020   CO2 26 06/17/2020   Lab Results  Component Value Date   ALT 13 06/17/2020   AST 19 06/17/2020   ALKPHOS 61 06/17/2020   BILITOT 0.4 06/17/2020   Lab Results  Component Value Date   HGBA1C 5.6 06/17/2020   Lab Results  Component Value Date   INSULIN 8.6 06/17/2020   Lab Results  Component Value Date   TSH 2.370 06/17/2020   Lab Results  Component Value Date   CHOL 192 06/17/2020   HDL 61 06/17/2020   LDLCALC 106 (H) 06/17/2020   LDLDIRECT 113.4 11/12/2012   TRIG 144 06/17/2020   CHOLHDL 3.1 06/17/2020   Lab Results  Component Value Date   WBC 5.0 06/17/2020   HGB 14.3 06/17/2020   HCT 44.0 06/17/2020   MCV 100 (H) 06/17/2020   PLT 329 06/17/2020   No results found for: IRON, TIBC, FERRITIN  Attestation Statements:   Reviewed by clinician on day of visit: allergies, medications, problem list, medical history, surgical history, family history, social history, and previous encounter notes.  Time spent on visit including pre-visit chart review and post-visit care and  charting was 22 minutes.    I, Trixie Dredge, am acting as transcriptionist for Dennard Nip, MD.  I have reviewed the above documentation for accuracy and completeness, and I agree with the above. -  Dennard Nip, MD

## 2021-02-21 ENCOUNTER — Encounter: Payer: Self-pay | Admitting: Family Medicine

## 2021-02-21 ENCOUNTER — Telehealth: Payer: Self-pay | Admitting: Family Medicine

## 2021-02-21 DIAGNOSIS — U071 COVID-19: Secondary | ICD-10-CM

## 2021-02-21 MED ORDER — NIRMATRELVIR/RITONAVIR (PAXLOVID)TABLET: 3.0000 | ORAL_TABLET | Freq: Two times a day (BID) | ORAL | 0 refills | Status: AC

## 2021-02-21 NOTE — Telephone Encounter (Addendum)
Patient is vaccinated against COVID-19?  If she had boosters She has history of sleep apnea and hypertension Normal renal function, can use paxlovid  Called her but did not reach- LMOM, let her know I sent her a MyChart message  Pt replied that she would like paxlovid

## 2021-02-21 NOTE — Telephone Encounter (Signed)
Patient was diagnosis with covid 02/19/21, experiencing headache, sore throat, cough, no shob, no chest pain. Please advise

## 2021-02-21 NOTE — Telephone Encounter (Signed)
Nurse Assessment Nurse: Waymond Cera, RN, Benjamine Mola Date/Time (Eastern Time): 02/21/2021 7:58:21 AM Confirm and document reason for call. If symptomatic, describe symptoms. ---Caller states that she tested positive for Covid on Saturday. States headache and sore throat. Does the patient have any new or worsening symptoms? ---Yes Will a triage be completed? ---Yes Related visit to physician within the last 2 weeks? ---No Does the PT have any chronic conditions? (i.e. diabetes, asthma, this includes High risk factors for pregnancy, etc.) ---Yes List chronic conditions. ---HTN high cholesterol Is this a behavioral health or substance abuse call? ---No Guidelines Guideline Title Affirmed Question Affirmed Notes Nurse Date/Time (Eastern Time) COVID-19 - Diagnosed or Suspected [1] HIGH RISK for severe COVID complications (e.g., weak immune system, age > 40 years, obesity with BMI > 25, pregnant, chronic lung disease or other chronic medical condition) AND [2] COVID symptoms (e.g., cough, fever) Cantrell, RN, Benjamine Mola 02/21/2021 7:58:56 AM PLEASE NOTE: All timestamps contained within this report are represented as [place] Standard Time. CONFIDENTIALTY NOTICE: This fax transmission is intended only for the addressee. It contains information that is legally privileged, confidential or otherwise protected from use or disclosure. If you are not the intended recipient, you are strictly prohibited from reviewing, disclosing, copying using or disseminating any of this information or taking any action in reliance on or regarding this information. If you have received this fax in error, please notify us immediately by telephone so that we can arrange for its return to [place]. Phone: [phone], Toll-Free: [phone], Fax: [phone] Page: 2 of 3 Call Id: [id number] Guidelines Guideline Title Affirmed Question Affirmed Notes Nurse Date/Time Eilene Ghazi Time) (Exceptions: Already seen by PCP and  no new or worsening symptoms.) Sore Throat [1] Sore throat with cough/cold symptoms AND [2] present < 5 days Cantrell, RN, Benjamine Mola 02/21/2021 8:04:13 AM Disp. Time Eilene Ghazi Time) Disposition Final User 02/21/2021 8:04:00 AM Call PCP Now Waymond Cera, RN, Benjamine Mola 02/21/2021 8:07:28 AM Home Care Yes Cantrell, RN, Lorin Glass Disagree/Comply Comply Caller Understands Yes PreDisposition Did not know what to do Care Advice Given Per Guideline CALL PCP NOW: * You need to discuss this with your doctor (or NP/PA). * I'll page the on-call provider now. If you haven't heard from the provider (or me) within 30 minutes, call again. CALL BACK IF: * You become worse CARE ADVICE given per COVID-19 - DIAGNOSED OR SUSPECTED (Adult) guideline. HOME CARE: * You should be able to treat this at home. CALL BACK IF: * Fever lasts over 3 days * You become worse CARE ADVICE given per Sore Throat (Adult) guideline. SOFT DIET: * Eat a soft diet. DRINK PLENTY OF LIQUIDS: * Drink plenty of liquids. It is important to stay well-hydrated. SORE THROAT: * Sip warm chicken broth or apple juice. * Suck on hard candy or an over-the-counter throat lozenge. * Gargle with warm salt water four times a day. To make salt water, put 1/2 teaspoon of salt in 8 oz (240 ml) of warm water. After Care Instructions Given Call Event Type User Date / Time Description Education document email Waymond Cera, RNBenjamine Mola 02/21/2021 8:08:02 AM COVID-19 Diagnosed or Suspected Education document email Waymond Cera, RN, Benjamine Mola 02/21/2021 8:08:02 AM COVID-19 Exposure - No Symptoms Education document email Waymond Cera, RN, Benjamine Mola 02/21/2021 8:08:02 AM COVID-19 or Influenza - How to Tell Education document email Cantrell, RN, Benjamine Mola 02/21/2021 8:08:02 AM COVID-19 Prevention PLEASE NOTE: All timestamps contained within this report are represented as [place] Standard Time. CONFIDENTIALTY NOTICE: This fax transmission is intended only for the  addressee. It  contains information that is legally privileged, confidential or otherwise protected from use or disclosure. If you are not the intended recipient, you are strictly prohibited from reviewing, disclosing, copying using or disseminating any of this information or taking any action in reliance on or regarding this information. If you have received this fax in error, please notify us immediately by telephone so that we can arrange for its return to [place]. Phone: [phone], Toll-Free: [phone], Fax: [phone] Page: 3 of 3 Call Id: [id number] Comments User: Sharol Given, RN Date/Time Eilene Ghazi Time): 02/21/2021 8:03:59 AM [place] # [phone]. States s/s started on Saturday. User: Sharol Given, RN Date/Time Eilene Ghazi Time): 02/21/2021 8:13:54 AM Called office since office is open and no on-call. States to send to [facility] (states they don't see covid in the office) and will send a note to nurse to see if she qualifies for infusion. User: Sharol Given, RN Date/Time Eilene Ghazi Time): 02/21/2021 8:15:56 AM Advised caller of office's advice. Advised to make sure she is seen in the next 4 hours. Verbalizes understanding

## 2021-02-21 NOTE — Telephone Encounter (Signed)
Dr. Charlett Blake is out, see below

## 2021-02-21 NOTE — Addendum Note (Signed)
Addended by: Lamar Blinks C on: 02/21/2021 11:38 AM   Modules accepted: Orders

## 2021-02-25 ENCOUNTER — Other Ambulatory Visit: Payer: Self-pay | Admitting: Family Medicine

## 2021-03-08 ENCOUNTER — Other Ambulatory Visit: Payer: Self-pay

## 2021-03-08 MED ORDER — EZETIMIBE 10 MG PO TABS: 10.0000 mg | ORAL_TABLET | Freq: Every day | ORAL | 3 refills | Status: DC

## 2021-03-11 IMAGING — DX DG HIP (WITH OR WITHOUT PELVIS) 2V BILAT
5 series · 5 of 5 positions shown · non-contrast
Comparison: None.

CLINICAL DATA: Left hip pain that radiates to the left femur x2
months.

EXAM:
DG HIP (WITH OR WITHOUT PELVIS) 2V BILAT

[pelvis ap]
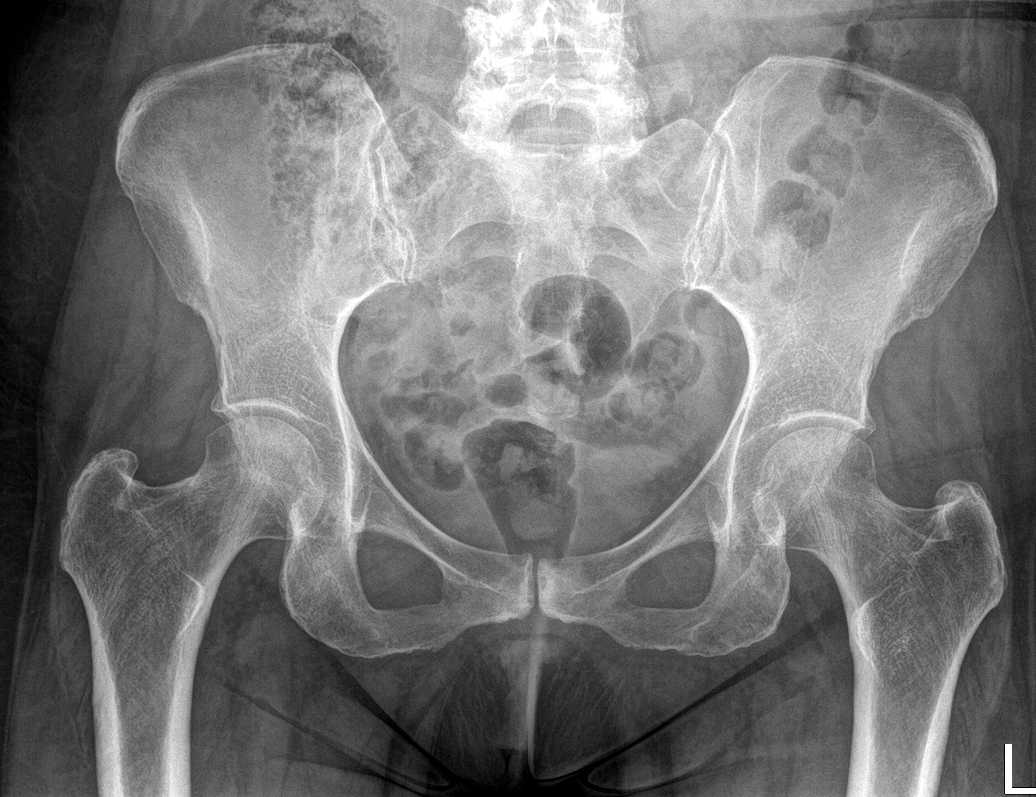

[hip ap (1 of 2)]
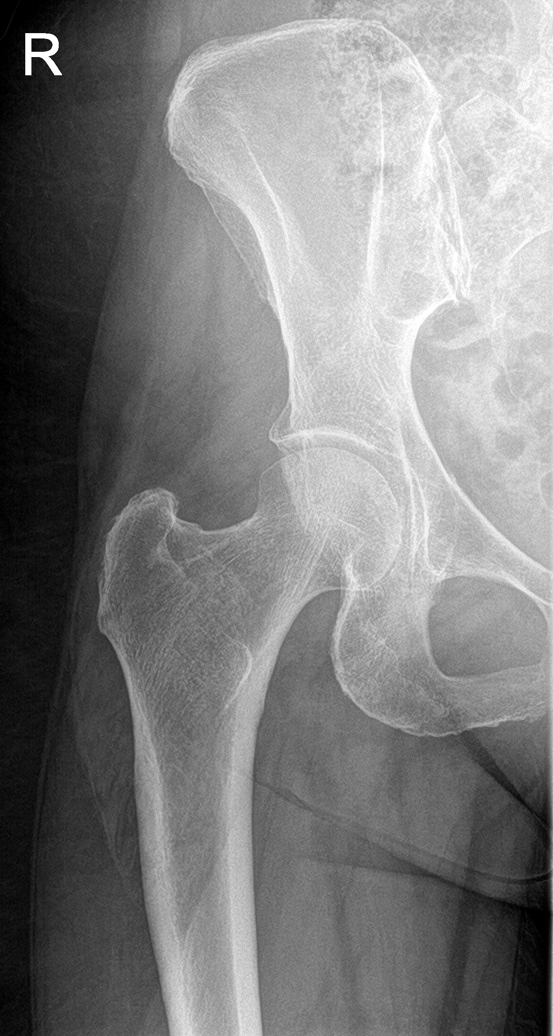

[hip frog leg (1 of 2)]
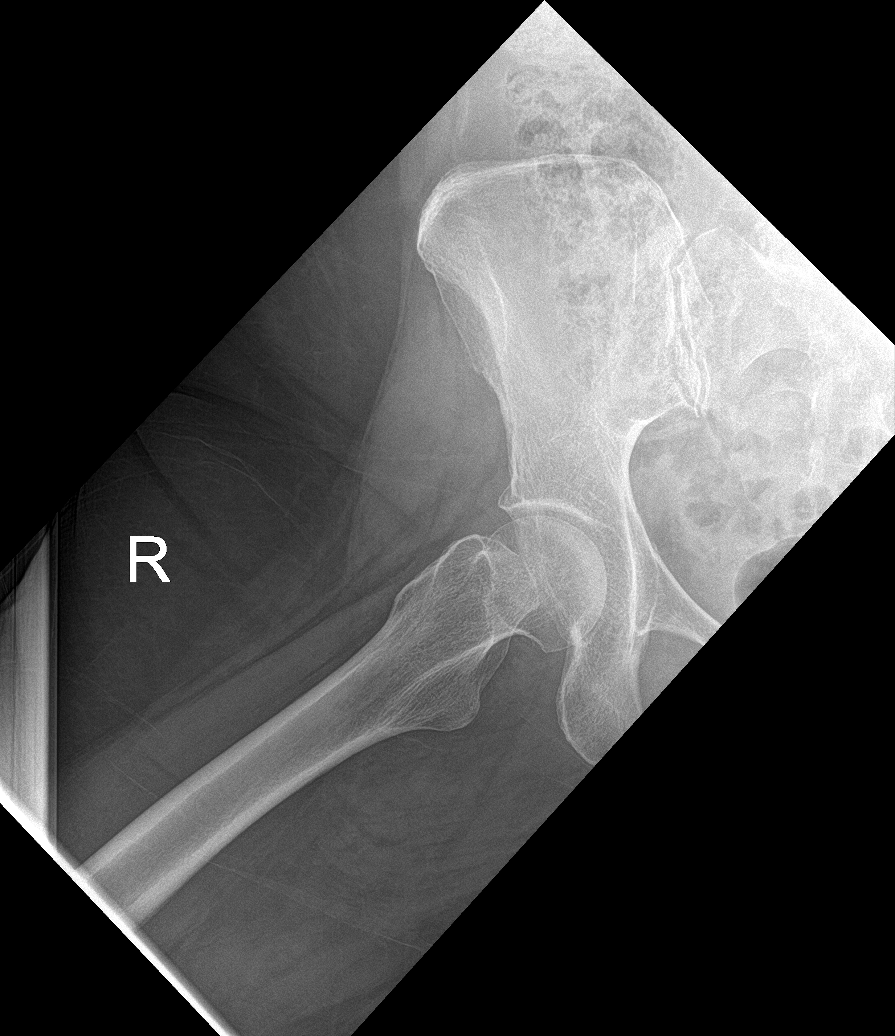

[hip ap (2 of 2)]
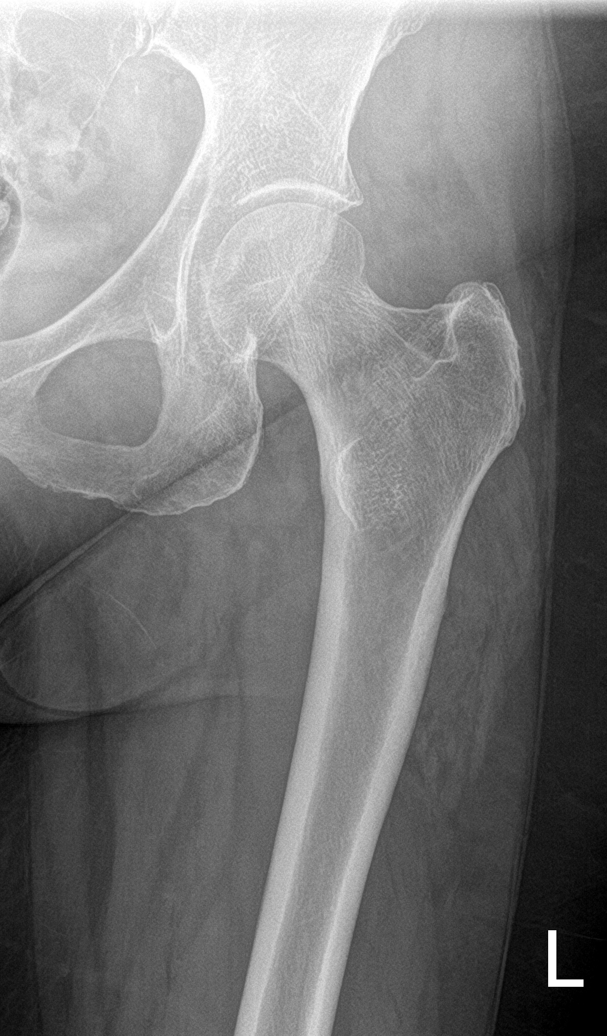

[hip frog leg (2 of 2)]
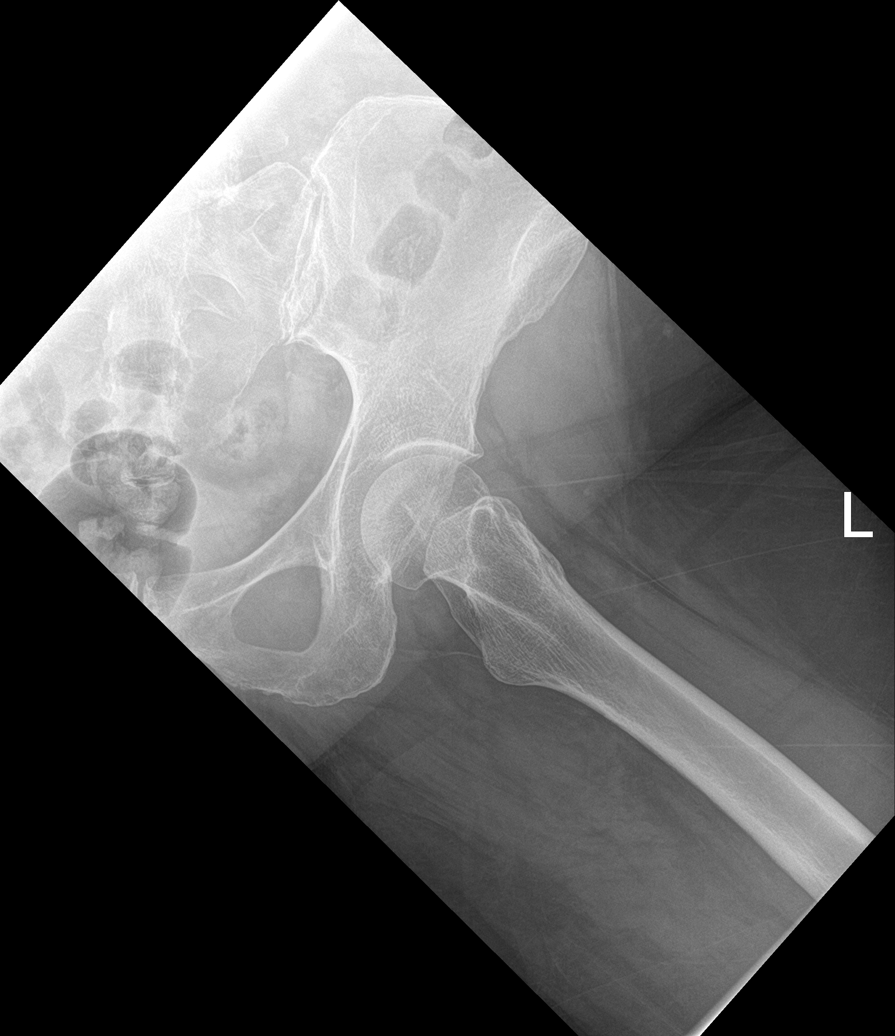

[5 of 5 positions shown; findings below may reference images not displayed]

FINDINGS: There is no acute displaced fracture or dislocation. Mild
degenerative changes are noted of both hips. The osseous
mineralization is within normal limits.
IMPRESSION: 1. No acute osseous abnormality.
2. Minimal degenerative changes are noted of both hips.

## 2021-03-17 ENCOUNTER — Ambulatory Visit (INDEPENDENT_AMBULATORY_CARE_PROVIDER_SITE_OTHER): Payer: Medicare HMO | Admitting: Family Medicine

## 2021-03-31 ENCOUNTER — Encounter: Payer: Self-pay | Admitting: Family Medicine

## 2021-03-31 ENCOUNTER — Ambulatory Visit: Payer: Medicare HMO | Admitting: Adult Health

## 2021-04-05 ENCOUNTER — Ambulatory Visit (INDEPENDENT_AMBULATORY_CARE_PROVIDER_SITE_OTHER): Payer: Medicare HMO | Admitting: Adult Health

## 2021-04-05 ENCOUNTER — Encounter (INDEPENDENT_AMBULATORY_CARE_PROVIDER_SITE_OTHER): Payer: Self-pay | Admitting: Adult Health

## 2021-04-05 ENCOUNTER — Other Ambulatory Visit: Payer: Self-pay

## 2021-04-05 VITALS — BP 139/68 | HR 60 | Temp 97.5°F | Ht 62.0 in | Wt 161.0 lb

## 2021-04-05 DIAGNOSIS — E669 Obesity, unspecified: Secondary | ICD-10-CM | POA: Diagnosis not present

## 2021-04-05 DIAGNOSIS — I10 Essential (primary) hypertension: Secondary | ICD-10-CM

## 2021-04-05 DIAGNOSIS — Z6832 Body mass index (BMI) 32.0-32.9, adult: Secondary | ICD-10-CM

## 2021-04-05 DIAGNOSIS — E7849 Other hyperlipidemia: Secondary | ICD-10-CM

## 2021-04-06 NOTE — Progress Notes (Signed)
Chief Complaint:   OBESITY Mandy [name] is here to discuss her progress with her obesity treatment plan along with follow-up of her obesity related diagnoses. Mandy [name] is on the Category 2 Plan and states she is following her eating plan approximately 50% of the time. Mandy [name] states she is walking and doing PT 60-90 minutes 7 times per week.  Today's visit was #: 14 Starting weight: 161 lb Starting date: 06/17/2020 Today's weight: 161 lbs Today's date: 04/05/2021 Total lbs lost to date: 0 Total lbs lost since last in-office visit: 0  Interim History: When Mandy [name] eats off plan, she enjoys ice cream or cookies.  She is very active- walking, PT, house work. Overall, she feels that her health is very good. She and her husband will often enjoy day trips or last minute long weekend excursions.  Subjective:   1. Other hyperlipidemia Dr. Hilty/cardiology, started Lelon Frohlich on Zetia 10 mg QD. She is tolerating it well.  When on statin therapy- Mandy [name] experienced cough and myalgias.  Lab Results  Component Value Date   ALT 13 06/17/2020   AST 19 06/17/2020   ALKPHOS 61 06/17/2020   BILITOT 0.4 06/17/2020   Lab Results  Component Value Date   CHOL 192 06/17/2020   HDL 61 06/17/2020   LDLCALC 106 (H) 06/17/2020   LDLDIRECT 113.4 11/12/2012   TRIG 144 06/17/2020   CHOLHDL 3.1 06/17/2020   2. Essential hypertension BP/HR stable at OV.  Mandy [name] denies tobacco/vape use.  She is on Dyazide 37.5-25 mg QD.  BP Readings from Last 3 Encounters:  04/05/21 139/68  02/14/21 (!) 142/74  01/12/21 (!) 149/85   Assessment/Plan:   1. Other hyperlipidemia Cardiovascular risk and specific lipid/LDL goals reviewed.  We discussed several lifestyle modifications today and Mandy [name] will continue to work on diet, exercise and weight loss efforts. Orders and follow up as documented in patient record.  Continue Zetia and follow up with cardiology as directed.  Counseling Intensive lifestyle modifications are the first line  treatment for this issue. Dietary changes: Increase soluble fiber. Decrease simple carbohydrates. Exercise changes: Moderate to vigorous-intensity aerobic activity 150 minutes per week if tolerated. Lipid-lowering medications: see documented in medical record.  2. Essential hypertension Mandy [name] is working on healthy weight loss and exercise to improve blood pressure control. We will watch for signs of hypotension as she continues her lifestyle modifications. Continue Dyazide 37.5-25 mg QD.  3. Obesity with current BMI 29.5  Mandy [name] is currently in the action stage of change. As such, her goal is to continue with weight loss efforts. She has agreed to the Category 2 Plan.   Keep snack calories <200/day. Provided cal/protein numbers for each meal. Enjoy two fun meals per week- follow PC/Robbins mindset.  Exercise goals:  As is  Behavioral modification strategies: increasing lean protein intake, decreasing simple carbohydrates, meal planning and cooking strategies, keeping healthy foods in the home, and planning for success.  Mandy [name] has agreed to follow-up with our clinic in 4 weeks. She was informed of the importance of frequent follow-up visits to maximize her success with intensive lifestyle modifications for her multiple health conditions.   Objective:   Blood pressure 139/68, pulse 60, temperature (!) 97.5 F (36.4 C), height 5\' 2"  (1.575 m), weight 161 lb (73 kg), SpO2 97 %. Body mass index is 29.45 kg/m.  General: Cooperative, alert, well developed, in no acute distress. HEENT: Conjunctivae and lids unremarkable. Cardiovascular: Regular rhythm.  Lungs: Normal work of breathing. Neurologic: No focal deficits.  Lab Results  Component Value Date   CREATININE 0.88 06/17/2020   BUN 22 06/17/2020   NA 139 06/17/2020   K 5.1 06/17/2020   CL 100 06/17/2020   CO2 26 06/17/2020   Lab Results  Component Value Date   ALT 13 06/17/2020   AST 19 06/17/2020   ALKPHOS 61 06/17/2020   BILITOT  0.4 06/17/2020   Lab Results  Component Value Date   HGBA1C 5.6 06/17/2020   Lab Results  Component Value Date   INSULIN 8.6 06/17/2020   Lab Results  Component Value Date   TSH 2.370 06/17/2020   Lab Results  Component Value Date   CHOL 192 06/17/2020   HDL 61 06/17/2020   LDLCALC 106 (H) 06/17/2020   LDLDIRECT 113.4 11/12/2012   TRIG 144 06/17/2020   CHOLHDL 3.1 06/17/2020   Lab Results  Component Value Date   WBC 5.0 06/17/2020   HGB 14.3 06/17/2020   HCT 44.0 06/17/2020   MCV 100 (H) 06/17/2020   PLT 329 06/17/2020   No results found for: IRON, TIBC, FERRITIN  Attestation Statements:   Reviewed by clinician on day of visit: allergies, medications, problem list, medical history, surgical history, family history, social history, and previous encounter notes.  Time spent on visit including pre-visit chart review and post-visit care and charting was 32 minutes.   Coral Ceo, CMA, am acting as transcriptionist for Mina Marble, NP.  I have reviewed the above documentation for accuracy and completeness, and I agree with the above. -  Drexel Ivey d. Arrianna Catala, NP-C

## 2021-04-08 DIAGNOSIS — G4733 Obstructive sleep apnea (adult) (pediatric): Secondary | ICD-10-CM | POA: Diagnosis not present

## 2021-04-27 DIAGNOSIS — Z85828 Personal history of other malignant neoplasm of skin: Secondary | ICD-10-CM | POA: Diagnosis not present

## 2021-04-27 DIAGNOSIS — L570 Actinic keratosis: Secondary | ICD-10-CM | POA: Diagnosis not present

## 2021-04-27 DIAGNOSIS — D1801 Hemangioma of skin and subcutaneous tissue: Secondary | ICD-10-CM | POA: Diagnosis not present

## 2021-04-27 DIAGNOSIS — D692 Other nonthrombocytopenic purpura: Secondary | ICD-10-CM | POA: Diagnosis not present

## 2021-04-27 DIAGNOSIS — L578 Other skin changes due to chronic exposure to nonionizing radiation: Secondary | ICD-10-CM | POA: Diagnosis not present

## 2021-05-05 DIAGNOSIS — H5203 Hypermetropia, bilateral: Secondary | ICD-10-CM | POA: Diagnosis not present

## 2021-05-05 DIAGNOSIS — H2513 Age-related nuclear cataract, bilateral: Secondary | ICD-10-CM | POA: Diagnosis not present

## 2021-05-09 ENCOUNTER — Other Ambulatory Visit: Payer: Self-pay

## 2021-05-09 ENCOUNTER — Ambulatory Visit (INDEPENDENT_AMBULATORY_CARE_PROVIDER_SITE_OTHER): Payer: Medicare HMO | Admitting: Adult Health

## 2021-05-09 VITALS — BP 145/79 | HR 58 | Temp 97.9°F | Ht 62.0 in | Wt 160.0 lb

## 2021-05-09 DIAGNOSIS — I10 Essential (primary) hypertension: Secondary | ICD-10-CM | POA: Diagnosis not present

## 2021-05-09 DIAGNOSIS — Z6832 Body mass index (BMI) 32.0-32.9, adult: Secondary | ICD-10-CM | POA: Diagnosis not present

## 2021-05-09 DIAGNOSIS — E669 Obesity, unspecified: Secondary | ICD-10-CM | POA: Diagnosis not present

## 2021-05-09 DIAGNOSIS — R739 Hyperglycemia, unspecified: Secondary | ICD-10-CM

## 2021-05-11 NOTE — Progress Notes (Addendum)
Chief Complaint:   OBESITY Mandy [name] is here to discuss her progress with her obesity treatment plan along with follow-up of her obesity related diagnoses. [name] is on the Category 2 Plan and states she is following her eating plan approximately 65% of the time. [name] states she is walking, personal training sessions, and Yoga 30 minutes 2 times per week.  Today's visit was #: 15 Starting weight: 161 lbs Starting date: 06/17/2020 Today's weight: 160 lbs Today's date: 05/09/2021 Total lbs lost to date: 19 Total lbs lost since last in-office visit: 1  Interim History: [name] has an excellent exercise regimen- walking, personal training sessions, home yoga practice.  When she eats off plan, it will be eating out with friends/family.  Subjective:   1. Essential hypertension BP/HR stable at OV. [name] is on Dyazide 37.5-25 mg QD.  2. Hyperglycemia In 2019, she had an elevated BG.  Her last A1c was 5.6 on 06/17/20- at goal.  Assessment/Plan:   1. Essential hypertension [name] will decrease sodium intake and continue regular walking. She is working on healthy weight loss and exercise to improve blood pressure control. We will watch for signs of hypotension as she continues her lifestyle modifications. Check fasting labs at next OV.  2. Hyperglycemia Check fasting labs at next OV.  3. Obesity with current BMI 29.4  [name] is currently in the action stage of change. As such, her goal is to continue with weight loss efforts. She has agreed to the Category 2 Plan.   Handout: Recipes II Guide. Try to follow Category 2 >80% of the time. Check fasting labs at next OV.  Exercise goals:  As is  Behavioral modification strategies: increasing lean protein intake, decreasing simple carbohydrates, meal planning and cooking strategies, keeping healthy foods in the home, and planning for success.  [name] has agreed to follow-up with our clinic in 4 weeks- fasting. She was informed of the importance of frequent  follow-up visits to maximize her success with intensive lifestyle modifications for her multiple health conditions.   Objective:   Blood pressure (!) 145/79, pulse (!) 58, temperature 97.9 F (36.6 C), height '5\' 2"'$  (1.575 m), weight 160 lb (72.6 kg), SpO2 98 %. Body mass index is 29.26 kg/m.  General: Cooperative, alert, well developed, in no acute distress. HEENT: Conjunctivae and lids unremarkable. Cardiovascular: Regular rhythm.  Lungs: Normal work of breathing. Neurologic: No focal deficits.   Lab Results  Component Value Date   CREATININE 0.88 06/17/2020   BUN 22 06/17/2020   NA 139 06/17/2020   K 5.1 06/17/2020   CL 100 06/17/2020   CO2 26 06/17/2020   Lab Results  Component Value Date   ALT 13 06/17/2020   AST 19 06/17/2020   ALKPHOS 61 06/17/2020   BILITOT 0.4 06/17/2020   Lab Results  Component Value Date   HGBA1C 5.6 06/17/2020   Lab Results  Component Value Date   INSULIN 8.6 06/17/2020   Lab Results  Component Value Date   TSH 2.370 06/17/2020   Lab Results  Component Value Date   CHOL 192 06/17/2020   HDL 61 06/17/2020   LDLCALC 106 (H) 06/17/2020   LDLDIRECT 113.4 11/12/2012   TRIG 144 06/17/2020   CHOLHDL 3.1 06/17/2020   Lab Results  Component Value Date   VD25OH 46.4 06/17/2020   VD25OH 64.08 08/14/2019   VD25OH 58.51 05/07/2019   Lab Results  Component Value Date   WBC 5.0 06/17/2020   HGB 14.3 06/17/2020  HCT 44.0 06/17/2020   MCV 100 (H) 06/17/2020   PLT 329 06/17/2020    Attestation Statements:   Reviewed by clinician on day of visit: allergies, medications, problem list, medical history, surgical history, family history, social history, and previous encounter notes.  Time spent on visit including pre-visit chart review and post-visit care and charting was 32 minutes.   Coral Ceo, CMA, am acting as transcriptionist for Mina Marble, NP.  I have reviewed the above documentation for accuracy and completeness, and  I agree with the above. -  Lakota Schweppe d. Allice Garro, NP-C

## 2021-05-30 ENCOUNTER — Other Ambulatory Visit: Payer: Self-pay | Admitting: Family Medicine

## 2021-06-08 ENCOUNTER — Other Ambulatory Visit: Payer: Self-pay

## 2021-06-08 ENCOUNTER — Ambulatory Visit (INDEPENDENT_AMBULATORY_CARE_PROVIDER_SITE_OTHER): Payer: Medicare HMO | Admitting: Adult Health

## 2021-06-08 ENCOUNTER — Encounter (INDEPENDENT_AMBULATORY_CARE_PROVIDER_SITE_OTHER): Payer: Self-pay | Admitting: Adult Health

## 2021-06-08 VITALS — BP 146/70 | HR 69 | Temp 98.1°F | Ht 62.0 in | Wt 161.0 lb

## 2021-06-08 DIAGNOSIS — I10 Essential (primary) hypertension: Secondary | ICD-10-CM | POA: Diagnosis not present

## 2021-06-08 DIAGNOSIS — E7849 Other hyperlipidemia: Secondary | ICD-10-CM

## 2021-06-08 DIAGNOSIS — R739 Hyperglycemia, unspecified: Secondary | ICD-10-CM | POA: Diagnosis not present

## 2021-06-08 DIAGNOSIS — Z6832 Body mass index (BMI) 32.0-32.9, adult: Secondary | ICD-10-CM | POA: Diagnosis not present

## 2021-06-08 DIAGNOSIS — E559 Vitamin D deficiency, unspecified: Secondary | ICD-10-CM | POA: Diagnosis not present

## 2021-06-08 DIAGNOSIS — E669 Obesity, unspecified: Secondary | ICD-10-CM

## 2021-06-08 NOTE — Progress Notes (Signed)
Chief Complaint:   OBESITY Kaleb is here to discuss her progress with her obesity treatment plan along with follow-up of her obesity related diagnoses. Lluvia is on the Category 2 Plan and states she is following her eating plan approximately 80% of the time. Quincie states she is walking/doing yoga/working with a Physiological scientist for 30-40 minutes 7 times per week.  Today's visit was #: 28 Starting weight: 179 lbs Starting date: 06/17/2020 Today's weight: 161 lbs Today's date: 06/08/2021 Total lbs lost to date: 18 lbs Total lbs lost since last in-office visit: 0  Interim History: Jakara has lost 18 pounds and states, "I feel great"! She remains quite active- walking, practicing yoga at home, and personal trainer sessions from her home.  Reviewed body composition with patient.  Subjective:   1. Essential hypertension Review: taking medications as instructed, no medication side effects noted, no chest pain on exertion, no dyspnea on exertion, no swelling of ankles.  BP elevated at office visit due to frustration over having left her wallet at home.  She denies acute cardiac symptoms at present.  BP Readings from Last 3 Encounters:  06/08/21 (!) 146/70  05/09/21 (!) 145/79  04/05/21 139/68   2. Other hyperlipidemia Tezra has hyperlipidemia and has been trying to improve her cholesterol levels with intensive lifestyle modification including a low saturated fat diet, exercise and weight loss. She denies any chest pain, claudication or myalgias.  Last lipid panel - LDL only slightly above goal.  Lab Results  Component Value Date   ALT 13 06/17/2020   AST 19 06/17/2020   ALKPHOS 61 06/17/2020   BILITOT 0.4 06/17/2020   Lab Results  Component Value Date   CHOL 192 06/17/2020   HDL 61 06/17/2020   LDLCALC 106 (H) 06/17/2020   LDLDIRECT 113.4 11/12/2012   TRIG 144 06/17/2020   CHOLHDL 3.1 06/17/2020   3. Hyperglycemia She is not on any blood glucose lowering medication.  On 09/10/2018,  BG 102.  4. Vitamin D deficiency She is currently taking Citracal + D 3/5/200 mg twice daily.   Lab Results  Component Value Date   VD25OH 46.4 06/17/2020   VD25OH 64.08 08/14/2019   VD25OH 58.51 05/07/2019   Assessment/Plan:   1. Essential hypertension Chanelle is working on healthy weight loss and exercise to improve blood pressure control. We will watch for signs of hypotension as she continues her lifestyle modifications.  Check labs today.  - Comprehensive metabolic panel  2. Other hyperlipidemia Cardiovascular risk and specific lipid/LDL goals reviewed.  We discussed several lifestyle modifications today and Sharen will continue to work on diet, exercise and weight loss efforts. Check labs today.   Counseling Intensive lifestyle modifications are the first line treatment for this issue. Dietary changes: Increase soluble fiber. Decrease simple carbohydrates. Exercise changes: Moderate to vigorous-intensity aerobic activity 150 minutes per week if tolerated. Lipid-lowering medications: see documented in medical record.  - Lipid panel  3. Hyperglycemia Will check A1c and insulin level today.  - Hemoglobin A1c - Insulin, random  4. Vitamin D deficiency Will check vitamin D level today.  - VITAMIN D 25 Hydroxy (Vit-D Deficiency, Fractures)  5. Obesity with current BMI 29.5  Francia is currently in the action stage of change. As such, her goal is to continue with weight loss efforts. She has agreed to the Category 2 Plan.   Exercise goals:  As is.  Behavioral modification strategies: increasing lean protein intake, decreasing simple carbohydrates, meal planning and cooking strategies,  keeping healthy foods in the home, and planning for success.  Adalai has agreed to follow-up with our clinic in 4 weeks. She was informed of the importance of frequent follow-up visits to maximize her success with intensive lifestyle modifications for her multiple health conditions.   Sequana was informed  we would discuss her lab results at her next visit unless there is a critical issue that needs to be addressed sooner. Jozalynn agreed to keep her next visit at the agreed upon time to discuss these results.  Objective:   Blood pressure (!) 146/70, pulse 69, temperature 98.1 F (36.7 C), height '5\' 2"'$  (1.575 m), weight 161 lb (73 kg), SpO2 98 %. Body mass index is 29.45 kg/m.  General: Cooperative, alert, well developed, in no acute distress. HEENT: Conjunctivae and lids unremarkable. Cardiovascular: Regular rhythm.  Lungs: Normal work of breathing. Neurologic: No focal deficits.   Lab Results  Component Value Date   CREATININE 0.88 06/17/2020   BUN 22 06/17/2020   NA 139 06/17/2020   K 5.1 06/17/2020   CL 100 06/17/2020   CO2 26 06/17/2020   Lab Results  Component Value Date   ALT 13 06/17/2020   AST 19 06/17/2020   ALKPHOS 61 06/17/2020   BILITOT 0.4 06/17/2020   Lab Results  Component Value Date   HGBA1C 5.6 06/17/2020   Lab Results  Component Value Date   INSULIN 8.6 06/17/2020   Lab Results  Component Value Date   TSH 2.370 06/17/2020   Lab Results  Component Value Date   CHOL 192 06/17/2020   HDL 61 06/17/2020   LDLCALC 106 (H) 06/17/2020   LDLDIRECT 113.4 11/12/2012   TRIG 144 06/17/2020   CHOLHDL 3.1 06/17/2020   Lab Results  Component Value Date   VD25OH 46.4 06/17/2020   VD25OH 64.08 08/14/2019   VD25OH 58.51 05/07/2019   Lab Results  Component Value Date   WBC 5.0 06/17/2020   HGB 14.3 06/17/2020   HCT 44.0 06/17/2020   MCV 100 (H) 06/17/2020   PLT 329 06/17/2020   Obesity Behavioral Intervention:   Approximately 15 minutes were spent on the discussion below.  ASK: We discussed the diagnosis of obesity with Lelon Frohlich today and Vanesha agreed to give [place] permission to discuss obesity behavioral modification therapy today.  ASSESS: Aleyda has the diagnosis of obesity and her BMI today is 29.5. Lafondra is in the action stage of change.   ADVISE: Giovanni was  educated on the multiple health risks of obesity as well as the benefit of weight loss to improve her health. She was advised of the need for long term treatment and the importance of lifestyle modifications to improve her current health and to decrease her risk of future health problems.  AGREE: Multiple dietary modification options and treatment options were discussed and Storey agreed to follow the recommendations documented in the above note.  ARRANGE: Jya was educated on the importance of frequent visits to treat obesity as outlined per CMS and USPSTF guidelines and agreed to schedule her next follow up appointment today.  Attestation Statements:   Reviewed by clinician on day of visit: allergies, medications, problem list, medical history, surgical history, family history, social history, and previous encounter notes.  I, Water quality scientist, CMA, am acting as Location manager for Mina Marble, NP.  I have reviewed the above documentation for accuracy and completeness, and I agree with the above. -  Vincenza Dail d. Mileah Hemmer, NP-C

## 2021-06-09 LAB — COMPREHENSIVE METABOLIC PANEL
ALT: 14 IU/L (ref 0–32)
AST: 22 IU/L (ref 0–40)
Albumin/Globulin Ratio: 2.2 (ref 1.2–2.2)
Albumin: 4.3 g/dL (ref 3.7–4.7)
Alkaline Phosphatase: 52 IU/L (ref 44–121)
BUN/Creatinine Ratio: 35 — ABNORMAL HIGH (ref 12–28)
BUN: 28 mg/dL — ABNORMAL HIGH (ref 8–27)
Bilirubin Total: 0.5 mg/dL (ref 0.0–1.2)
CO2: 29 mmol/L (ref 20–29)
Calcium: 9.7 mg/dL (ref 8.7–10.3)
Chloride: 101 mmol/L (ref 96–106)
Creatinine, Ser: 0.79 mg/dL (ref 0.57–1.00)
Globulin, Total: 2 g/dL (ref 1.5–4.5)
Glucose: 87 mg/dL (ref 65–99)
Potassium: 4.7 mmol/L (ref 3.5–5.2)
Sodium: 140 mmol/L (ref 134–144)
Total Protein: 6.3 g/dL (ref 6.0–8.5)
eGFR: 77 mL/min/{1.73_m2} (ref >59)

## 2021-06-09 LAB — LIPID PANEL
Chol/HDL Ratio: 2.7 ratio (ref 0.0–4.4)
Cholesterol, Total: 188 mg/dL (ref 100–199)
HDL: 69 mg/dL (ref >39)
LDL Chol Calc (NIH): 104 mg/dL — ABNORMAL HIGH (ref 0–99)
Triglycerides: 85 mg/dL (ref 0–149)
VLDL Cholesterol Cal: 15 mg/dL (ref 5–40)

## 2021-06-09 LAB — INSULIN, RANDOM: INSULIN: 5.4 u[IU]/mL (ref 2.6–24.9)

## 2021-06-09 LAB — HEMOGLOBIN A1C
Est. average glucose Bld gHb Est-mCnc: 108 mg/dL
Hgb A1c MFr Bld: 5.4 % (ref 4.8–5.6)

## 2021-06-09 LAB — VITAMIN D 25 HYDROXY (VIT D DEFICIENCY, FRACTURES): Vit D, 25-Hydroxy: 73.2 ng/mL (ref 30.0–100.0)

## 2021-06-14 ENCOUNTER — Encounter: Payer: Self-pay | Admitting: Family Medicine

## 2021-06-21 ENCOUNTER — Ambulatory Visit (INDEPENDENT_AMBULATORY_CARE_PROVIDER_SITE_OTHER): Payer: Medicare HMO | Admitting: Family Medicine

## 2021-06-21 ENCOUNTER — Encounter: Payer: Self-pay | Admitting: Family Medicine

## 2021-06-21 ENCOUNTER — Other Ambulatory Visit: Payer: Self-pay

## 2021-06-21 VITALS — BP 122/76 | HR 67 | Temp 98.0°F | Resp 16 | Ht 62.0 in | Wt 170.0 lb

## 2021-06-21 DIAGNOSIS — E7849 Other hyperlipidemia: Secondary | ICD-10-CM | POA: Diagnosis not present

## 2021-06-21 DIAGNOSIS — M8580 Other specified disorders of bone density and structure, unspecified site: Secondary | ICD-10-CM

## 2021-06-21 DIAGNOSIS — Z1231 Encounter for screening mammogram for malignant neoplasm of breast: Secondary | ICD-10-CM

## 2021-06-21 DIAGNOSIS — Z780 Asymptomatic menopausal state: Secondary | ICD-10-CM

## 2021-06-21 DIAGNOSIS — Z0000 Encounter for general adult medical examination without abnormal findings: Secondary | ICD-10-CM | POA: Diagnosis not present

## 2021-06-21 DIAGNOSIS — I10 Essential (primary) hypertension: Secondary | ICD-10-CM | POA: Diagnosis not present

## 2021-06-21 DIAGNOSIS — R739 Hyperglycemia, unspecified: Secondary | ICD-10-CM

## 2021-06-21 DIAGNOSIS — Z23 Encounter for immunization: Secondary | ICD-10-CM | POA: Diagnosis not present

## 2021-06-21 DIAGNOSIS — E2839 Other primary ovarian failure: Secondary | ICD-10-CM

## 2021-06-21 DIAGNOSIS — E782 Mixed hyperlipidemia: Secondary | ICD-10-CM

## 2021-06-21 LAB — TSH: TSH: 2.7 u[IU]/mL (ref 0.35–5.50)

## 2021-06-21 NOTE — Assessment & Plan Note (Signed)
Patient encouraged to maintain heart healthy diet, regular exercise, adequate sleep. Consider daily probiotics. Take medications as prescribed. Colonoscopy

## 2021-06-21 NOTE — Progress Notes (Signed)
Patient ID: Mandy [name], female    DOB: 09-11-1946  Age: 75 y.o. MRN: [id number]    Subjective:   Chief Complaint  Patient presents with   Annual Exam   Subjective  HPI Mandy [name] presents for office visit today for comprehensive physical exam today and follow up on management of chronic concerns. She reports that she is doing great, however she has had COVID on mother's day. She states that she did fine and symptoms have subsided in 3 days. Denies CP/palp/SOB/HA/congestion/fevers/GI or GU c/o. Taking meds as prescribed. She reports that her 78 y/o brother has passed away due to DLB and Parkinson's disease. She endorses participating in a 2x a week exercise program, walking, and doing yoga as well.   Review of Systems  Constitutional:  Negative for chills, fatigue and fever.  HENT:  Negative for congestion, rhinorrhea, sinus pressure, sinus pain, sore throat and trouble swallowing.   Eyes:  Negative for pain.  Respiratory:  Negative for cough and shortness of breath.   Cardiovascular:  Negative for chest pain, palpitations and leg swelling.  Gastrointestinal:  Negative for abdominal pain, blood in stool, diarrhea, nausea and vomiting.  Genitourinary:  Negative for decreased urine volume, flank pain, frequency, vaginal bleeding and vaginal discharge.  Musculoskeletal:  Negative for back pain.  Neurological:  Negative for headaches.   History Past Medical History:  Diagnosis Date   Atrophic vaginitis 11/18/2012   Back pain    Chicken pox as a child   Cough 07/14/2016   Dermatitis 04/26/2011   GERD (gastroesophageal reflux disease) 03/20/2017   High cholesterol    HTN (hypertension) 04/13/2010   Qualifier: Diagnosis of  By: Charlett Blake MD, Lennis Korb     Hypertension    Measles as a child   Miscarriage 1981   Onychomycosis 04/26/2011   OSA (obstructive sleep apnea) 10/30/2017   Osteopenia 10/17/1999   Osteopenia 04/13/2010   Qualifier: Diagnosis of  By: Charlett Blake MD, Mariano Colon     Preventative health  care 07/14/2016   Screening for malignant neoplasm of the cervix 04/26/2011   Sleep apnea    Tinea pedis 05/10/2014   Urinary frequency 03/20/2017    She has a past surgical history that includes Dilation and curettage of uterus (1981).   Her family history includes Arthritis in an other family member; Asthma in her maternal grandfather; Birth defects in her maternal aunt; Cancer in her paternal grandmother; Coronary artery disease in her mother; Dementia in her brother, maternal grandmother, mother, and paternal grandmother; Diabetes in her father and paternal grandfather; Hearing loss in her paternal grandfather; Heart attack (age of onset: 28) in her mother; Heart disease in her father; Hyperlipidemia in her mother and another family member; Hypertension in her mother and another family member; Obesity in her father and mother; Osteoporosis in her mother; Other in an other family member; Parkinson's disease in her brother; Stroke in her father and mother.She reports that she quit smoking about 26 years ago. Her smoking use included cigarettes. She has a 40.00 pack-year smoking history. She has never used smokeless tobacco. She reports that she does not drink alcohol and does not use drugs.  Current Outpatient Medications on File Prior to Visit  Medication Sig Dispense Refill   aspirin 81 MG tablet Take 81 mg by mouth daily.     azelastine (ASTELIN) 0.1 % nasal spray Place 1 spray into both nostrils daily as needed for rhinitis. Use in each nostril as directed 30 mL 5   b  complex vitamins tablet Take 1 tablet by mouth daily.     calcium citrate-vitamin D (CITRACAL+D) 315-200 MG-UNIT per tablet Take 1 tablet by mouth 2 (two) times daily.     Loperamide HCl (IMODIUM PO) Take 1 tablet by mouth daily.      Methylsulfonylmethane (MSM PO) Take 2 capsules by mouth daily. 1,000 mg 3 x daily     multivitamin (THERAGRAN) per tablet Take 1 tablet by mouth daily.     mupirocin ointment (BACTROBAN) 2 % APPLY  INTO THE NOSE AT BEDTIME AS NEEDED. 22 g 1   Nutritional Supplements (VITAMIN D BOOSTER PO) Take by mouth.     Omega-3 Fatty Acids-Vitamin E (COROMEGA PO) Take by mouth.     PREMARIN vaginal cream APPLY A SMALL AMOUNT (APPROXIMATELY 0.25G)PER VAGINA 3 TIMES A WEEK AT BEDTIME. 30 g 0   Probiotic Product (PROBIOTIC DAILY PO) Take by mouth daily.     Red Yeast Rice Extract (RED YEAST RICE PO) Take by mouth.     triamterene-hydrochlorothiazide (DYAZIDE) 37.5-25 MG capsule TAKE (1) CAPSULE DAILY. 90 capsule 1   TURMERIC PO Take 1 capsule by mouth daily.      vitamin C (ASCORBIC ACID) 500 MG tablet Take 500 mg by mouth daily.     Zinc Acetate, Oral, (ZINC ACETATE PO) Take by mouth.     ezetimibe (ZETIA) 10 MG tablet Take 1 tablet (10 mg total) by mouth daily. 90 tablet 3   No current facility-administered medications on file prior to visit.     Objective:  Objective  Physical Exam Constitutional:      General: She is not in acute distress.    Appearance: Normal appearance. She is not ill-appearing or toxic-appearing.  HENT:     Head: Normocephalic and atraumatic.     Right Ear: Tympanic membrane, ear canal and external ear normal.     Left Ear: Tympanic membrane, ear canal and external ear normal.     Nose: No congestion or rhinorrhea.  Eyes:     Extraocular Movements: Extraocular movements intact.     Right eye: No nystagmus.     Left eye: No nystagmus.     Pupils: Pupils are equal, round, and reactive to light.  Cardiovascular:     Rate and Rhythm: Normal rate and regular rhythm.     Pulses: Normal pulses.     Heart sounds: Normal heart sounds. No murmur heard. Pulmonary:     Effort: Pulmonary effort is normal. No respiratory distress.     Breath sounds: Normal breath sounds. No wheezing, rhonchi or rales.  Abdominal:     General: Bowel sounds are normal.     Palpations: Abdomen is soft. There is no mass.     Tenderness: no abdominal tenderness There is no guarding.     Hernia:  No hernia is present.  Musculoskeletal:        General: Normal range of motion.     Cervical back: Normal range of motion and neck supple.  Skin:    General: Skin is warm and dry.  Neurological:     Mental Status: She is alert and oriented to person, place, and time.     Cranial Nerves: No facial asymmetry.     Sensory: Sensation is intact.     Motor: Motor function is intact. No weakness.  Psychiatric:        Behavior: Behavior normal.   BP 122/76   Pulse 67   Temp 98 F (36.7 C)  Resp 16   Ht '5\' 2"'$  (1.575 m)   Wt 170 lb (77.1 kg)   SpO2 93%   BMI 31.09 kg/m  Wt Readings from Last 3 Encounters:  06/21/21 170 lb (77.1 kg)  06/08/21 161 lb (73 kg)  05/09/21 160 lb (72.6 kg)     Lab Results  Component Value Date   WBC 5.0 06/17/2020   HGB 14.3 06/17/2020   HCT 44.0 06/17/2020   PLT 329 06/17/2020   GLUCOSE 87 06/08/2021   CHOL 188 06/08/2021   TRIG 85 06/08/2021   HDL 69 06/08/2021   LDLDIRECT 113.4 11/12/2012   LDLCALC 104 (H) 06/08/2021   ALT 14 06/08/2021   AST 22 06/08/2021   NA 140 06/08/2021   K 4.7 06/08/2021   CL 101 06/08/2021   CREATININE 0.79 06/08/2021   BUN 28 (H) 06/08/2021   CO2 29 06/08/2021   TSH 2.70 06/21/2021   HGBA1C 5.4 06/08/2021    CT CARDIAC SCORING  Addendum Date: 03/31/2020   ADDENDUM REPORT: 03/31/2020 17:04 CLINICAL DATA:  Risk stratification EXAM: Coronary Calcium Score TECHNIQUE: The patient was scanned on a [employer] scanner. Axial non-contrast 3 mm slices were carried out through the heart. The data set was analyzed on a dedicated work station and scored using the [place]. FINDINGS: Non-cardiac: See separate report from [facility]. Ascending Aorta: Normal Caliber. Scattered calcifications in the ascending and descending aorta. Pericardium: Normal Coronary arteries: Normal coronary orgins. IMPRESSION: Coronary calcium score of 74. This was 55th percentile for age and sex matched control. Fransico Him  Electronically Signed   By: Fransico Him   On: 03/31/2020 17:04   Result Date: 03/31/2020 EXAM: OVER-READ INTERPRETATION  CT CHEST The following report is an over-read performed by radiologist Dr. Abigail Miyamoto of [facility], [place] on 03/31/2020. This over-read does not include interpretation of cardiac or coronary anatomy or pathology. The calcium score interpretation by the cardiologist is attached. COMPARISON:  Chest radiograph 06/06/2018.  No comparison CT. FINDINGS: Vascular: Aortic atherosclerosis. Mediastinum/Nodes: No imaged thoracic adenopathy. Lungs/Pleura: No pleural fluid. Volume loss and scarring in the lingula and right middle lobe. Upper Abdomen: There may be a cyst in the left lobe of the liver at 1.0 cm on 41/4. Normal imaged portions of the spleen, stomach. Musculoskeletal: No acute osseous abnormality. Mild right hemidiaphragm elevation. IMPRESSION: No acute findings in the imaged extracardiac chest. Electronically Signed: By: Abigail Miyamoto M.D. On: 03/31/2020 17:00     Assessment & Plan:  Plan    No orders of the defined types were placed in this encounter.   Problem List Items Addressed This Visit     Mixed hyperlipidemia    Encourage heart healthy diet such as MIND or DASH diet, increase exercise, avoid trans fats, simple carbohydrates and processed foods, consider a krill or fish or flaxseed oil cap daily.       Osteopenia   Relevant Orders   DG Bone Density   Preventative health care    Patient encouraged to maintain heart healthy diet, regular exercise, adequate sleep. Consider daily probiotics. Take medications as prescribed. Labs ordered and reviewed. MGM and Dexa scan ordered flu shot given      Essential hypertension    Well controlled, no changes to meds. Encouraged heart healthy diet such as the DASH diet and exercise as tolerated.       Other hyperlipidemia    Patient encouraged to maintain heart healthy diet, regular exercise, adequate sleep.  Consider daily probiotics.  Take medications as prescribed. Colonoscopy      Hyperglycemia    hgba1c acceptable, minimize simple carbs. Increase exercise as tolerated. Continue current meds      RESOLVED: HTN (hypertension) - Primary   Relevant Orders   TSH (Completed)   Other Visit Diagnoses     Estrogen deficiency       Relevant Orders   DG Bone Density   Post-menopausal       Relevant Orders   DG Bone Density   Breast cancer screening by mammogram       Relevant Orders   MM 3D SCREEN BREAST BILATERAL   Need for influenza vaccination       Relevant Orders   Flu Vaccine QUAD High Dose(Fluad) (Completed)       Follow-up: Return in about 1 year (around 06/21/2022) for annual exam.  I, Suezanne Jacquet, acting as a scribe for Penni Homans, MD, have documented all relevent documentation on behalf of Penni Homans, MD, as directed by Penni Homans, MD while in the presence of Penni Homans, MD. DO:06/22/21.  I, Mosie Lukes, MD personally performed the services described in this documentation. All medical record entries made by the scribe were at my direction and in my presence. I have reviewed the chart and agree that the record reflects my personal performance and is accurate and complete

## 2021-06-21 NOTE — Patient Instructions (Signed)
Tdap at pharmacy or with injury at office  Preventive Care 76 Years and Older, Female Preventive care refers to lifestyle choices and visits with your health care provider that can promote health and wellness. This includes: A yearly physical exam. This is also called an annual wellness visit. Regular dental and eye exams. Immunizations. Screening for certain conditions. Healthy lifestyle choices, such as: Eating a healthy diet. Getting regular exercise. Not using drugs or products that contain nicotine and tobacco. Limiting alcohol use. What can I expect for my preventive care visit? Physical exam Your health care provider will check your: Height and weight. These may be used to calculate your BMI (body mass index). BMI is a measurement that tells if you are at a healthy weight. Heart rate and blood pressure. Body temperature. Skin for abnormal spots. Counseling Your health care provider may ask you questions about your: Past medical problems. Family's medical history. Alcohol, tobacco, and drug use. Emotional well-being. Home life and relationship well-being. Sexual activity. Diet, exercise, and sleep habits. History of falls. Memory and ability to understand (cognition). Work and work Statistician. Pregnancy and menstrual history. Access to firearms. What immunizations do I need? Vaccines are usually given at various ages, according to a schedule. Your health care provider will recommend vaccines for you based on your age, medical history, and lifestyle or other factors, such as travel or where you work. What tests do I need? Blood tests Lipid and cholesterol levels. These may be checked every 5 years, or more often depending on your overall health. Hepatitis C test. Hepatitis B test. Screening Lung cancer screening. You may have this screening every year starting at age 75 if you have a 30-pack-year history of smoking and currently smoke or have quit within the past 15  years. Colorectal cancer screening. All adults should have this screening starting at age 63 and continuing until age 76. Your health care provider may recommend screening at age 76 if you are at increased risk. You will have tests every 1-10 years, depending on your results and the type of screening test. Diabetes screening. This is done by checking your blood sugar (glucose) after you have not eaten for a while (fasting). You may have this done every 1-3 years. Mammogram. This may be done every 1-2 years. Talk with your health care provider about how often you should have regular mammograms. Abdominal aortic aneurysm (AAA) screening. You may need this if you are a current or former smoker. BRCA-related cancer screening. This may be done if you have a family history of breast, ovarian, tubal, or peritoneal cancers. Other tests STD (sexually transmitted disease) testing, if you are at risk. Bone density scan. This is done to screen for osteoporosis. You may have this done starting at age 7. Talk with your health care provider about your test results, treatment options, and if necessary, the need for more tests. Follow these instructions at home: Eating and drinking  Eat a diet that includes fresh fruits and vegetables, whole grains, lean protein, and low-fat dairy products. Limit your intake of foods with high amounts of sugar, saturated fats, and salt. Take vitamin and mineral supplements as recommended by your health care provider. Do not drink alcohol if your health care provider tells you not to drink. If you drink alcohol: Limit how much you have to 0-1 drink a day. Be aware of how much alcohol is in your drink. In the [place] one drink equals one 12 oz bottle of beer (355 mL), one  5 oz glass of wine (148 mL), or one 1 oz glass of hard liquor (44 mL). Lifestyle Take daily care of your teeth and gums. Brush your teeth every morning and night with fluoride toothpaste. Floss one time  each day. Stay active. Exercise for at least 30 minutes 5 or more days each week. Do not use any products that contain nicotine or tobacco, such as cigarettes, e-cigarettes, and chewing tobacco. If you need help quitting, ask your health care provider. Do not use drugs. If you are sexually active, practice safe sex. Use a condom or other form of protection in order to prevent STIs (sexually transmitted infections). Talk with your health care provider about taking a low-dose aspirin or statin. Find healthy ways to cope with stress, such as: Meditation, yoga, or listening to music. Journaling. Talking to a trusted person. Spending time with friends and family. Safety Always wear your seat belt while driving or riding in a vehicle. Do not drive: If you have been drinking alcohol. Do not ride with someone who has been drinking. When you are tired or distracted. While texting. Wear a helmet and other protective equipment during sports activities. If you have firearms in your house, make sure you follow all gun safety procedures. What's next? Visit your health care provider once a year for an annual wellness visit. Ask your health care provider how often you should have your eyes and teeth checked. Stay up to date on all vaccines. This information is not intended to replace advice given to you by your health care provider. Make sure you discuss any questions you have with your health care provider. Document Revised: 12/10/2020 Document Reviewed: 09/26/2018 Elsevier Patient Education  2022 [employer].

## 2021-06-22 NOTE — Assessment & Plan Note (Signed)
Well controlled, no changes to meds. Encouraged heart healthy diet such as the DASH diet and exercise as tolerated.  °

## 2021-06-22 NOTE — Assessment & Plan Note (Signed)
Patient encouraged to maintain heart healthy diet, regular exercise, adequate sleep. Consider daily probiotics. Take medications as prescribed. Labs ordered and reviewed. MGM and Dexa scan ordered flu shot given

## 2021-06-22 NOTE — Assessment & Plan Note (Signed)
Encourage heart healthy diet such as MIND or DASH diet, increase exercise, avoid trans fats, simple carbohydrates and processed foods, consider a krill or fish or flaxseed oil cap daily.  °

## 2021-06-22 NOTE — Assessment & Plan Note (Signed)
hgba1c acceptable, minimize simple carbs. Increase exercise as tolerated. Continue current meds 

## 2021-06-28 DIAGNOSIS — L570 Actinic keratosis: Secondary | ICD-10-CM | POA: Diagnosis not present

## 2021-06-28 DIAGNOSIS — L578 Other skin changes due to chronic exposure to nonionizing radiation: Secondary | ICD-10-CM | POA: Diagnosis not present

## 2021-06-30 ENCOUNTER — Telehealth (HOSPITAL_BASED_OUTPATIENT_CLINIC_OR_DEPARTMENT_OTHER): Payer: Self-pay

## 2021-07-06 ENCOUNTER — Ambulatory Visit (INDEPENDENT_AMBULATORY_CARE_PROVIDER_SITE_OTHER): Payer: Medicare HMO | Admitting: Adult Health

## 2021-07-11 DIAGNOSIS — G4733 Obstructive sleep apnea (adult) (pediatric): Secondary | ICD-10-CM | POA: Diagnosis not present

## 2021-07-14 ENCOUNTER — Encounter (HOSPITAL_BASED_OUTPATIENT_CLINIC_OR_DEPARTMENT_OTHER): Payer: Self-pay

## 2021-07-18 ENCOUNTER — Other Ambulatory Visit: Payer: Self-pay

## 2021-07-18 ENCOUNTER — Ambulatory Visit (HOSPITAL_BASED_OUTPATIENT_CLINIC_OR_DEPARTMENT_OTHER): Payer: Medicare HMO | Admitting: Internal Medicine

## 2021-07-18 VITALS — BP 142/92 | HR 50 | Ht 62.0 in | Wt 169.8 lb

## 2021-07-18 DIAGNOSIS — R931 Abnormal findings on diagnostic imaging of heart and coronary circulation: Secondary | ICD-10-CM | POA: Diagnosis not present

## 2021-07-18 DIAGNOSIS — T466X5D Adverse effect of antihyperlipidemic and antiarteriosclerotic drugs, subsequent encounter: Secondary | ICD-10-CM | POA: Diagnosis not present

## 2021-07-18 DIAGNOSIS — M791 Myalgia, unspecified site: Secondary | ICD-10-CM | POA: Diagnosis not present

## 2021-07-18 DIAGNOSIS — E782 Mixed hyperlipidemia: Secondary | ICD-10-CM | POA: Diagnosis not present

## 2021-07-18 DIAGNOSIS — I10 Essential (primary) hypertension: Secondary | ICD-10-CM | POA: Diagnosis not present

## 2021-07-18 NOTE — Progress Notes (Signed)
LIPID CLINIC CONSULT NOTE  Chief Complaint:  Statin intolerance  Primary Care Physician: Mandy Lukes, MD  Primary Cardiologist:  None  HPI:  Mandy [name] is a 75 y.o. female who is being seen today for the evaluation of an intolerance at the request of Mandy Lukes, MD.  A pleasant 75 year old female who was kindly referred for statin intolerance.  She has a longstanding history of high cholesterol reported her mother had high cholesterol in her father died in an MI at age 49 however he was diabetic.  She had previously had reasonable cholesterol control with a total of 173 and LDL 101 on atorvastatin however then developed severe myalgias.  She had taken it for many years prior to that.  She stopped the atorvastatin and her symptoms completely resolved.  She was then advised to go on to Crestor 5 mg twice weekly.  She had very similar symptoms on the Crestor and discontinue that.  She ultimately went to a health food store and was started on red yeast rice and coenzyme Q10.  She reports healthy diet and exercises regularly getting more than 10,000 steps every day.  Other comorbidities include hypertension which is controlled.  She does not smoke.  07/16/2020  Mandy [name] is seen today in follow-up.  Overall she continues to do well.  She is managed to lose weight recently has been working with the weight management clinic at home.  She is very pleased with this.  Lipids have improved nicely on combination of over-the-counter red yeast rice which is tolerable and ezetimibe.  Most recently total cholesterol was 192, HDL 61, LDL 106 and triglycerides 144.  She had a low risk calcium score of 74, 55th percentile for age and sex matched control.  07/18/2021  Mandy [name] returns today for follow-up.  She is done very well and actually lost more than 20 pounds.  She is working with a healthy weight management center.  This is caused a significant reduction in her triglycerides.  Labs now show  total cholesterol 188, HDL 69, triglycerides 85 (down from 144) and LDL 104.  She remains on ezetimibe and red yeast rice.  She seems to tolerate this well.  She is asymptomatic otherwise.  PMHx:  Past Medical History:  Diagnosis Date   Atrophic vaginitis 11/18/2012   Back pain    Chicken pox as a child   Cough 07/14/2016   Dermatitis 04/26/2011   GERD (gastroesophageal reflux disease) 03/20/2017   High cholesterol    HTN (hypertension) 04/13/2010   Qualifier: Diagnosis of  By: Charlett Blake MD, Stacey     Hypertension    Measles as a child   Miscarriage 1981   Onychomycosis 04/26/2011   OSA (obstructive sleep apnea) 10/30/2017   Osteopenia 10/17/1999   Osteopenia 04/13/2010   Qualifier: Diagnosis of  By: Charlett Blake MD, Stacey     Preventative health care 07/14/2016   Screening for malignant neoplasm of the cervix 04/26/2011   Sleep apnea    Tinea pedis 05/10/2014   Urinary frequency 03/20/2017    Past Surgical History:  Procedure Laterality Date   DILATION AND CURETTAGE OF UTERUS  1981   miscarriage    FAMHx:  Family History  Problem Relation Age of Onset   Osteoporosis Mother    Coronary artery disease Mother    Heart attack Mother 2   Hypertension Mother    Dementia Mother    Hyperlipidemia Mother    Stroke Mother    Obesity  Mother    Diabetes Father        type 2   Heart disease Father    Stroke Father    Obesity Father    Dementia Brother        Lewy Body   Parkinson's disease Brother    Birth defects Maternal Aunt    Dementia Maternal Grandmother    Asthma Maternal Grandfather    Cancer Paternal Grandmother        ovarian   Dementia Paternal Grandmother    Diabetes Paternal Grandfather        tyoe 2   Hearing loss Paternal Grandfather    Arthritis Other    Hyperlipidemia Other    Hypertension Other    Other Other        Cardiovascular disorder    SOCHx:   reports that she quit smoking about 26 years ago. Her smoking use included cigarettes. She has a 40.00 pack-year  smoking history. She has never used smokeless tobacco. She reports that she does not drink alcohol and does not use drugs.  ALLERGIES:  Allergies  Allergen Reactions   Codeine    Pneumococcal Vaccine Polyvalent     REACTION: rash at injection site and induration.    ROS: Pertinent items noted in HPI and remainder of comprehensive ROS otherwise negative.  HOME MEDS: Current Outpatient Medications on File Prior to Visit  Medication Sig Dispense Refill   aspirin 81 MG tablet Take 81 mg by mouth daily.     azelastine (ASTELIN) 0.1 % nasal spray Place 1 spray into both nostrils daily as needed for rhinitis. Use in each nostril as directed 30 mL 5   b complex vitamins tablet Take 1 tablet by mouth daily.     calcium citrate-vitamin D (CITRACAL+D) 315-200 MG-UNIT per tablet Take 1 tablet by mouth 2 (two) times daily.     ezetimibe (ZETIA) 10 MG tablet Take 1 tablet (10 mg total) by mouth daily. 90 tablet 3   Methylsulfonylmethane (MSM PO) Take 2 capsules by mouth daily. 1,000 mg 3 x daily     multivitamin (THERAGRAN) per tablet Take 1 tablet by mouth daily.     mupirocin ointment (BACTROBAN) 2 % APPLY INTO THE NOSE AT BEDTIME AS NEEDED. 22 g 1   Nutritional Supplements (VITAMIN D BOOSTER PO) Take by mouth.     Omega-3 Fatty Acids-Vitamin E (COROMEGA PO) Take by mouth.     PREMARIN vaginal cream APPLY A SMALL AMOUNT (APPROXIMATELY 0.25G)PER VAGINA 3 TIMES A WEEK AT BEDTIME. 30 g 0   Probiotic Product (PROBIOTIC DAILY PO) Take by mouth daily.     Red Yeast Rice Extract (RED YEAST RICE PO) Take by mouth.     triamterene-hydrochlorothiazide (DYAZIDE) 37.5-25 MG capsule TAKE (1) CAPSULE DAILY. 90 capsule 1   TURMERIC PO Take 1 capsule by mouth daily.      vitamin C (ASCORBIC ACID) 500 MG tablet Take 500 mg by mouth daily.     Zinc Acetate, Oral, (ZINC ACETATE PO) Take by mouth.     No current facility-administered medications on file prior to visit.    LABS/IMAGING: No results found for  this or any previous visit (from the past 48 hour(s)). No results found.  LIPID PANEL:    Component Value Date/Time   CHOL 188 06/08/2021 0935   CHOL 205 (H) 11/12/2012 0808   TRIG 85 06/08/2021 0935   TRIG 90 11/12/2012 0808   HDL 69 06/08/2021 0935   HDL 55 11/12/2012 8841  CHOLHDL 2.7 06/08/2021 0935   CHOLHDL 4 02/05/2020 0713   VLDL 13.4 02/05/2020 0713   LDLCALC 104 (H) 06/08/2021 0935   LDLCALC 132 (H) 11/12/2012 0808   LDLDIRECT 113.4 11/12/2012 0808    WEIGHTS: Wt Readings from Last 3 Encounters:  07/18/21 169 lb 12.8 oz (77 kg)  06/21/21 170 lb (77.1 kg)  06/08/21 161 lb (73 kg)    VITALS: BP (!) 142/92   Pulse (!) 50   Ht 5\' 2"  (1.575 m)   Wt 169 lb 12.8 oz (77 kg)   SpO2 96%   BMI 31.06 kg/m   EXAM: Deferred  EKG: Deferred  ASSESSMENT: Mixed dyslipidemia, low to intermediate 10-year risk by pooled cohort equation Low risk calcium score at 74, 55th percentile (03/2020) Statin intolerant, myalgias  PLAN: 1.   Ms. Bagheri continues to do well with even lower cholesterol as she is lost significant weight recently using the weight management center.  Cholesterol is accordingly improved with lower triglycerides.  She is asymptomatic.  We will continue her current therapies with ezetimibe and red yeast rice.  Plan follow-up with me in 2years or sooner if necessary.  Pixie Casino, MD, [facility], [place] Director of the Advanced Lipid Disorders &  Cardiovascular Risk Reduction Clinic Diplomate of the American Board of Clinical Lipidology Attending Cardiologist  Direct Dial: [phone]  Fax: [phone]  Website:  [email or web address] Juste 07/18/2021, 8:34 AM

## 2021-07-18 NOTE — Patient Instructions (Signed)
Medication Instructions:  Your physician recommends that you continue on your current medications as directed. Please refer to the Current Medication list given to you today.  *If you need a refill on your cardiac medications before your next appointment, please call your pharmacy*   Follow-Up: At [facility], you and your health needs are our priority.  As part of our continuing mission to provide you with exceptional heart care, we have created designated Provider Care Teams.  These Care Teams include your primary Cardiologist (physician) and Advanced Practice Providers (APPs -  Physician Assistants and Nurse Practitioners) who all work together to provide you with the care you need, when you need it.  We recommend signing up for the patient portal called "MyChart".  Sign up information is provided on this After Visit Summary.  MyChart is used to connect with patients for Virtual Visits (Telemedicine).  Patients are able to view lab/test results, encounter notes, upcoming appointments, etc.  Non-urgent messages can be sent to your provider as well.   To learn more about what you can do with MyChart, go to [email or web address].    Your next appointment:   2 year(s)  The format for your next appointment:   In Person  Provider:   K. Mali Hilty, MD   Other Instructions

## 2021-07-19 ENCOUNTER — Ambulatory Visit (INDEPENDENT_AMBULATORY_CARE_PROVIDER_SITE_OTHER): Payer: Medicare HMO | Admitting: Adult Health

## 2021-07-20 ENCOUNTER — Telehealth (HOSPITAL_BASED_OUTPATIENT_CLINIC_OR_DEPARTMENT_OTHER): Payer: Self-pay

## 2021-08-04 ENCOUNTER — Ambulatory Visit (INDEPENDENT_AMBULATORY_CARE_PROVIDER_SITE_OTHER): Payer: Medicare HMO | Admitting: Adult Health

## 2021-08-04 ENCOUNTER — Other Ambulatory Visit: Payer: Self-pay

## 2021-08-04 ENCOUNTER — Encounter (INDEPENDENT_AMBULATORY_CARE_PROVIDER_SITE_OTHER): Payer: Self-pay | Admitting: Adult Health

## 2021-08-04 VITALS — BP 138/86 | HR 69 | Temp 98.2°F | Ht 62.0 in | Wt 167.0 lb

## 2021-08-04 DIAGNOSIS — E782 Mixed hyperlipidemia: Secondary | ICD-10-CM | POA: Diagnosis not present

## 2021-08-04 DIAGNOSIS — E669 Obesity, unspecified: Secondary | ICD-10-CM

## 2021-08-04 DIAGNOSIS — I10 Essential (primary) hypertension: Secondary | ICD-10-CM | POA: Diagnosis not present

## 2021-08-04 DIAGNOSIS — Z6832 Body mass index (BMI) 32.0-32.9, adult: Secondary | ICD-10-CM

## 2021-08-04 NOTE — Progress Notes (Signed)
Chief Complaint:   OBESITY Dory is here to discuss her progress with her obesity treatment plan along with follow-up of her obesity related diagnoses. Adaly is on the Category 2 Plan and states she is following her eating plan approximately 50% of the time. Dim states she is Walking/doing yoga for 30-120 minutes 7 times per week.  Today's visit was #: 25 Starting weight: 179 lbs Starting date: 06/17/2020 Today's weight: 167 lbs Today's date: 08/04/2021 Total lbs lost to date: 12 lbs Total lbs lost since last in-office visit: 0  Interim History:  This is her first office visit since August 2022.  She reports "falling off the wagon" the last several months.  She reports consuming foods higher in CHO and sugar that the plan recommends.  On 07/18/2021, office visit with Dr. Debara Pickett of Cardiology.   Significant reduction in her triglycerides.  Labs now show total cholesterol 188, HDL 69, triglycerides 85 (down from 144) and LDL 104.  She remains on ezetimibe and red yeast rice.     Subjective:   1. Mixed hyperlipidemia On 07/18/2021, office visit with Dr. Debara Pickett of Cardiology.   Significant reduction in her triglycerides.  Labs now show total cholesterol 188, HDL 69, triglycerides 85 (down from 144) and LDL 104.  She remains on ezetimibe and red yeast rice.   F/u in 2 years with cards.  2. Essential hypertension BP/HR at goal at office visit. She is on Dyazide 37.5/25 mg daily.  Assessment/Plan:   1. Mixed hyperlipidemia Continue lipid therapy per Cardiology.  Continue to decrease saturated fat.  2. Essential hypertension Continue Dyazide 37.5 mg daily.  3. Obesity with current BMI 30.2  Mikeria is currently in the action stage of change. As such, her goal is to continue with weight loss efforts. She has agreed to following a lower carbohydrate, vegetable and lean protein rich diet plan.   Exercise goals:  As is.  Behavioral modification strategies: increasing lean protein intake,  decreasing simple carbohydrates, meal planning and cooking strategies, keeping healthy foods in the home, ways to avoid boredom eating, and keeping a strict food journal.  Shantal has agreed to follow-up with our clinic in 2 weeks. She was informed of the importance of frequent follow-up visits to maximize her success with intensive lifestyle modifications for her multiple health conditions.   Objective:   Blood pressure 138/86, pulse 69, temperature 98.2 F (36.8 C), height 5\' 2"  (1.575 m), weight 167 lb (75.8 kg), SpO2 97 %. Body mass index is 30.54 kg/m.  General: Cooperative, alert, well developed, in no acute distress. HEENT: Conjunctivae and lids unremarkable. Cardiovascular: Regular rhythm.  Lungs: Normal work of breathing. Neurologic: No focal deficits.   Lab Results  Component Value Date   CREATININE 0.79 06/08/2021   BUN 28 (H) 06/08/2021   NA 140 06/08/2021   K 4.7 06/08/2021   CL 101 06/08/2021   CO2 29 06/08/2021   Lab Results  Component Value Date   ALT 14 06/08/2021   AST 22 06/08/2021   ALKPHOS 52 06/08/2021   BILITOT 0.5 06/08/2021   Lab Results  Component Value Date   HGBA1C 5.4 06/08/2021   HGBA1C 5.6 06/17/2020   Lab Results  Component Value Date   INSULIN 5.4 06/08/2021   INSULIN 8.6 06/17/2020   Lab Results  Component Value Date   TSH 2.70 06/21/2021   Lab Results  Component Value Date   CHOL 188 06/08/2021   HDL 69 06/08/2021   LDLCALC 104 (  H) 06/08/2021   LDLDIRECT 113.4 11/12/2012   TRIG 85 06/08/2021   CHOLHDL 2.7 06/08/2021   Lab Results  Component Value Date   VD25OH 73.2 06/08/2021   VD25OH 46.4 06/17/2020   VD25OH 64.08 08/14/2019   Lab Results  Component Value Date   WBC 5.0 06/17/2020   HGB 14.3 06/17/2020   HCT 44.0 06/17/2020   MCV 100 (H) 06/17/2020   PLT 329 06/17/2020   Attestation Statements:   Reviewed by clinician on day of visit: allergies, medications, problem list, medical history, surgical history, family  history, social history, and previous encounter notes.  Time spent on visit including pre-visit chart review and post-visit care and charting was 28 minutes.   I, Water quality scientist, CMA, am acting as Location manager for Mina Marble, NP.  I have reviewed the above documentation for accuracy and completeness, and I agree with the above. - Wanza Szumski d. Aubreana Cornacchia, NP-C

## 2021-08-18 ENCOUNTER — Ambulatory Visit (INDEPENDENT_AMBULATORY_CARE_PROVIDER_SITE_OTHER): Payer: Medicare HMO | Admitting: Adult Health

## 2021-08-18 ENCOUNTER — Encounter (INDEPENDENT_AMBULATORY_CARE_PROVIDER_SITE_OTHER): Payer: Self-pay | Admitting: Adult Health

## 2021-08-18 ENCOUNTER — Other Ambulatory Visit: Payer: Self-pay

## 2021-08-18 VITALS — BP 127/69 | HR 64 | Temp 98.2°F | Ht 62.0 in | Wt 165.0 lb

## 2021-08-18 DIAGNOSIS — I10 Essential (primary) hypertension: Secondary | ICD-10-CM

## 2021-08-18 DIAGNOSIS — E782 Mixed hyperlipidemia: Secondary | ICD-10-CM | POA: Diagnosis not present

## 2021-08-18 DIAGNOSIS — E669 Obesity, unspecified: Secondary | ICD-10-CM

## 2021-08-18 DIAGNOSIS — Z6832 Body mass index (BMI) 32.0-32.9, adult: Secondary | ICD-10-CM | POA: Diagnosis not present

## 2021-08-22 NOTE — Progress Notes (Signed)
Chief Complaint:   OBESITY Mandy [name] is here to discuss her progress with her obesity treatment plan along with follow-up of her obesity related diagnoses. Mandy [name] is on following a lower carbohydrate, vegetable and lean protein rich diet plan and states she is following her eating plan approximately 95% of the time. Mandy [name] states she is walking/doing yoga/working with a Physiological scientist for 06 minutes 7 times per week.  Today's visit was #: 34 Starting weight: 179 lbs Starting date: 06/17/2020 Today's weight: 165 lbs Today's date: 08/18/2021 Total lbs lost to date: 14 lbs Total lbs lost since last in-office visit: 2 lbs  Interim History: Mandy [name] focused on compliance to the low carbohydrate prescribed meal plan - >95%. She continues to exercise regularly - walking, yoga, personal trainer.  Subjective:   1. Essential hypertension BP/HR excellent at office visit. She is on triamterene/HCTZ 37.5/25 mg daily.  2. Mixed hyperlipidemia Cardiology continued Zetia.  She continued OTC red yeast rice extract. She denies tobacco/vape use.  Assessment/Plan:   1. Essential hypertension Remain well hydrated, limit salt intake.  2. Mixed hyperlipidemia Continue Zetia and OTC supplement.  3. Obesity with current BMI 30.3  Mandy [name] is currently in the action stage of change. As such, her goal is to continue with weight loss efforts. She has agreed to the Category 2 Plan.   Exercise goals:  As is.  Behavioral modification strategies: increasing lean protein intake, decreasing simple carbohydrates, meal planning and cooking strategies, keeping healthy foods in the home, and planning for success.  Mandy [name] has agreed to follow-up with our clinic in 2 weeks. She was informed of the importance of frequent follow-up visits to maximize her success with intensive lifestyle modifications for her multiple health conditions.   Objective:   Blood pressure 127/69, pulse 64, temperature 98.2 F (36.8 C), height 5\' 2"   (1.575 m), weight 165 lb (74.8 kg), SpO2 98 %. Body mass index is 30.18 kg/m.  General: Cooperative, alert, well developed, in no acute distress. HEENT: Conjunctivae and lids unremarkable. Cardiovascular: Regular rhythm.  Lungs: Normal work of breathing. Neurologic: No focal deficits.   Lab Results  Component Value Date   CREATININE 0.79 06/08/2021   BUN 28 (H) 06/08/2021   NA 140 06/08/2021   K 4.7 06/08/2021   CL 101 06/08/2021   CO2 29 06/08/2021   Lab Results  Component Value Date   ALT 14 06/08/2021   AST 22 06/08/2021   ALKPHOS 52 06/08/2021   BILITOT 0.5 06/08/2021   Lab Results  Component Value Date   HGBA1C 5.4 06/08/2021   HGBA1C 5.6 06/17/2020   Lab Results  Component Value Date   INSULIN 5.4 06/08/2021   INSULIN 8.6 06/17/2020   Lab Results  Component Value Date   TSH 2.70 06/21/2021   Lab Results  Component Value Date   CHOL 188 06/08/2021   HDL 69 06/08/2021   LDLCALC 104 (H) 06/08/2021   LDLDIRECT 113.4 11/12/2012   TRIG 85 06/08/2021   CHOLHDL 2.7 06/08/2021   Lab Results  Component Value Date   VD25OH 73.2 06/08/2021   VD25OH 46.4 06/17/2020   VD25OH 64.08 08/14/2019   Lab Results  Component Value Date   WBC 5.0 06/17/2020   HGB 14.3 06/17/2020   HCT 44.0 06/17/2020   MCV 100 (H) 06/17/2020   PLT 329 06/17/2020   Attestation Statements:   Reviewed by clinician on day of visit: allergies, medications, problem list, medical history, surgical history, family history, social history, and previous  encounter notes.  Time spent on visit including pre-visit chart review and post-visit care and charting was 28 minutes.   I, Water quality scientist, CMA, am acting as Location manager for Mina Marble, NP.  I have reviewed the above documentation for accuracy and completeness, and I agree with the above. -  Jove Beyl d. Ziyan Hillmer, NP-C

## 2021-09-01 ENCOUNTER — Other Ambulatory Visit: Payer: Self-pay

## 2021-09-01 ENCOUNTER — Ambulatory Visit (INDEPENDENT_AMBULATORY_CARE_PROVIDER_SITE_OTHER): Payer: Medicare HMO | Admitting: Adult Health

## 2021-09-01 ENCOUNTER — Encounter (INDEPENDENT_AMBULATORY_CARE_PROVIDER_SITE_OTHER): Payer: Self-pay | Admitting: Adult Health

## 2021-09-01 VITALS — BP 136/77 | HR 59 | Temp 98.0°F | Ht 62.0 in | Wt 166.0 lb

## 2021-09-01 DIAGNOSIS — E669 Obesity, unspecified: Secondary | ICD-10-CM

## 2021-09-01 DIAGNOSIS — E782 Mixed hyperlipidemia: Secondary | ICD-10-CM | POA: Diagnosis not present

## 2021-09-01 DIAGNOSIS — Z6832 Body mass index (BMI) 32.0-32.9, adult: Secondary | ICD-10-CM

## 2021-09-05 NOTE — Progress Notes (Signed)
Chief Complaint:   OBESITY Mandy [name] is here to discuss her progress with her obesity treatment plan along with follow-up of her obesity related diagnoses. Mandy [name] is on the Category 2 Plan and states she is following her eating plan approximately 90% of the time. Mandy [name] states she is doing yoga/walking/doing PT for 60-90 minutes 4 times per week.  Today's visit was #: 85 Starting weight: 179 lbs Starting date: 06/17/2020 Today's weight: 166 lbs Today's date: 09/01/2021 Total lbs lost to date: 13 lbs Total lbs lost since last in-office visit: 0  Interim History: Mandy [name] will be celebrating Thanksgiving at [facility], with family.   She is considering selling her current home in [facility] - downsize. She reports sleeping 4-5 hours per night.  Subjective:   1. Mixed hyperlipidemia 07/18/21 Cards/Dr. Debara Pickett- On Zetia and Red Yeast Rice- Statin intolerant: myalgias. Low to intermediate 10 yr risk pooled by co-hort equation. Calcium Score-74- places her in 55th % (6/21). She denies tobacco/vape use. She denies acute cardiac sx's.   Assessment/Plan:   1. Mixed hyperlipidemia Continue current therapy per Cardiology.  2. Obesity with current BMI 30.4  Mandy [name] is currently in the action stage of change. As such, her goal is to continue with weight loss efforts. She has agreed to the Category 2 Plan.   Exercise goals:  As is.  Behavioral modification strategies: increasing lean protein intake, decreasing simple carbohydrates, meal planning and cooking strategies, keeping healthy foods in the home, and planning for success.  Mandy [name] has agreed to follow-up with our clinic in 4 weeks. She was informed of the importance of frequent follow-up visits to maximize her success with intensive lifestyle modifications for her multiple health conditions.   Objective:   Blood pressure 136/77, pulse (!) 59, temperature 98 F (36.7 C), height 5\' 2"  (1.575 m), weight 166 lb (75.3 kg), SpO2 98 %. Body mass  index is 30.36 kg/m.  General: Cooperative, alert, well developed, in no acute distress. HEENT: Conjunctivae and lids unremarkable. Cardiovascular: Regular rhythm.  Lungs: Normal work of breathing. Neurologic: No focal deficits.   Lab Results  Component Value Date   CREATININE 0.79 06/08/2021   BUN 28 (H) 06/08/2021   NA 140 06/08/2021   K 4.7 06/08/2021   CL 101 06/08/2021   CO2 29 06/08/2021   Lab Results  Component Value Date   ALT 14 06/08/2021   AST 22 06/08/2021   ALKPHOS 52 06/08/2021   BILITOT 0.5 06/08/2021   Lab Results  Component Value Date   HGBA1C 5.4 06/08/2021   HGBA1C 5.6 06/17/2020   Lab Results  Component Value Date   INSULIN 5.4 06/08/2021   INSULIN 8.6 06/17/2020   Lab Results  Component Value Date   TSH 2.70 06/21/2021   Lab Results  Component Value Date   CHOL 188 06/08/2021   HDL 69 06/08/2021   LDLCALC 104 (H) 06/08/2021   LDLDIRECT 113.4 11/12/2012   TRIG 85 06/08/2021   CHOLHDL 2.7 06/08/2021   Lab Results  Component Value Date   VD25OH 73.2 06/08/2021   VD25OH 46.4 06/17/2020   VD25OH 64.08 08/14/2019   Lab Results  Component Value Date   WBC 5.0 06/17/2020   HGB 14.3 06/17/2020   HCT 44.0 06/17/2020   MCV 100 (H) 06/17/2020   PLT 329 06/17/2020   Attestation Statements:   Reviewed by clinician on day of visit: allergies, medications, problem list, medical history, surgical history, family history, social history, and previous encounter notes.  Time spent on visit including pre-visit chart review and post-visit care and charting was 25 minutes.   I, Water quality scientist, CMA, am acting as Location manager for Mina Marble, NP.  I have reviewed the above documentation for accuracy and completeness, and I agree with the above. - Kewon Statler d. Satomi Buda, NP-C

## 2021-09-14 ENCOUNTER — Encounter (HOSPITAL_BASED_OUTPATIENT_CLINIC_OR_DEPARTMENT_OTHER): Payer: Self-pay | Admitting: Internal Medicine

## 2021-09-14 NOTE — Telephone Encounter (Signed)
Yes .. ok to try a red yeast rice holiday.  Mali

## 2021-09-15 ENCOUNTER — Ambulatory Visit (INDEPENDENT_AMBULATORY_CARE_PROVIDER_SITE_OTHER): Payer: Medicare HMO | Admitting: Adult Health

## 2021-09-26 ENCOUNTER — Encounter: Payer: Self-pay | Admitting: *Deleted

## 2021-09-26 ENCOUNTER — Encounter (HOSPITAL_BASED_OUTPATIENT_CLINIC_OR_DEPARTMENT_OTHER): Payer: Self-pay | Admitting: Internal Medicine

## 2021-09-26 NOTE — Telephone Encounter (Signed)
Just an FYI

## 2021-09-29 ENCOUNTER — Ambulatory Visit (INDEPENDENT_AMBULATORY_CARE_PROVIDER_SITE_OTHER): Payer: Medicare HMO | Admitting: Adult Health

## 2021-10-11 DIAGNOSIS — G4733 Obstructive sleep apnea (adult) (pediatric): Secondary | ICD-10-CM | POA: Diagnosis not present

## 2021-10-16 HISTORY — PX: OTHER SURGICAL HISTORY: SHX169

## 2021-11-27 ENCOUNTER — Other Ambulatory Visit: Payer: Self-pay | Admitting: Family Medicine

## 2021-12-08 ENCOUNTER — Ambulatory Visit: Payer: Medicare HMO | Admitting: Adult Health

## 2021-12-08 ENCOUNTER — Other Ambulatory Visit: Payer: Self-pay

## 2021-12-08 ENCOUNTER — Encounter: Payer: Self-pay | Admitting: Adult Health

## 2021-12-08 VITALS — BP 128/70 | HR 65 | Temp 97.6°F | Ht 62.0 in | Wt 186.6 lb

## 2021-12-08 DIAGNOSIS — J310 Chronic rhinitis: Secondary | ICD-10-CM | POA: Diagnosis not present

## 2021-12-08 DIAGNOSIS — G4733 Obstructive sleep apnea (adult) (pediatric): Secondary | ICD-10-CM | POA: Diagnosis not present

## 2021-12-08 DIAGNOSIS — Z87891 Personal history of nicotine dependence: Secondary | ICD-10-CM

## 2021-12-08 HISTORY — DX: Personal history of nicotine dependence: Z87.891

## 2021-12-08 NOTE — Patient Instructions (Addendum)
May try Allegra 180mg  daily in place Claritin.  Chlorpheniramine 4mg  (Chlortab) At bedtime  As needed  Drainage  Saline nasal spray Twice daily   Saline nasal gel At bedtime  .  Flonase 1 puff daily in am  Astelin 1 puffs daily in pm .  Continue on CPAP At bedtime   Melatonin At bedtime  As needed  Insomnia .  Try to wear each night for at least 6hrs each night  Stay active , healthy diet  Do not drive if sleepy  Refer to lung cancer screening program.  Follow up with Dr. Halford Chessman or Elieser Tetrick NP  In 1 year and As needed

## 2021-12-08 NOTE — Assessment & Plan Note (Signed)
Chronic allergic rhinitis.-Change Claritin to Allegra.  Continue on Chlortab at bedtime.  And would continue on Flonase and Astelin.  Plan  Patient Instructions  May try Allegra 180mg  daily in place Claritin.  Chlorpheniramine 4mg  (Chlortab) At bedtime  As needed  Drainage  Saline nasal spray Twice daily   Saline nasal gel At bedtime  .  Flonase 1 puff daily in am  Astelin 1 puffs daily in pm .  Continue on CPAP At bedtime   Melatonin At bedtime  As needed  Insomnia .  Try to wear each night for at least 6hrs each night  Stay active , healthy diet  Do not drive if sleepy  Refer to lung cancer screening program.  Follow up with Dr. Halford Chessman or Willmer Fellers NP  In 1 year and As needed

## 2021-12-08 NOTE — Progress Notes (Signed)
@Patient  ID: Mandy [name], female    DOB: 1946-02-20, 76 y.o.   MRN: [id number]  Chief Complaint  Patient presents with   Follow-up    Referring provider: Mosie Lukes, MD  HPI: 76 year old female followed for obstructive sleep apnea and chronic allergic rhinitis  TEST/EVENTS :  HST 10/26/17 >> AHI 17.4, SaO2 low 79% Auto CPAP 04/15/19 to 05/14/19 >> used on 19 of 30 nights with average 5 hrs 33 min.  Average AHI 0.8 with median CPAP 7 and 95 th percentile CPAP 10 cm H2O  12/08/2021 Follow up : OSA and AR  Patient returns for 1 year follow-up.  Patient has underlying moderate obstructive sleep apnea on nocturnal CPAP.  Patient says she tries to get her CPAP usage and each night.  Sometimes she falls asleep and forgets to put it on until later in the night or she gets up in the middle the night and her machine is off.  CPAP usage is 87%.  Daily average usage at 4.5 hours.  Patient is on auto CPAP 5 to 15 cm H2O.  Average daily pressure at 10 cm H2O.  AHI 0.4.  Patient education on importance of CPAP usage and potential complications of untreated sleep apnea.  Patient complains of ongoing allergy symptoms.  She is currently taking Claritin in the morning and chlor tab at bedtime.  She says she has been doing well up until the last 2 weeks.  Now is having some nasal congestion and postnasal drainage.  She denies any fever or discolored mucus.  Allergies  Allergen Reactions   Codeine    Pneumococcal Vaccine Polyvalent     REACTION: rash at injection site and induration.   Red Yeast Rice Extract     Leg pain    Immunization History  Administered Date(s) Administered   Fluad Quad(high Dose 65+) 07/12/2019, 07/13/2020, 06/21/2021   Hepatitis B 04/13/2008, 06/15/2008, 12/17/2008   Influenza Split 08/03/2011, 07/31/2012   Influenza, High Dose Seasonal PF 07/23/2013, 07/30/2015, 07/14/2016, 07/26/2017, 07/19/2018   Influenza,inj,Quad PF,6+ Mos 07/08/2014   PFIZER(Purple Top)SARS-COV-2  Vaccination 11/21/2019, 12/15/2019   Pneumococcal Conjugate-13 10/30/2013   Pneumococcal Polysaccharide-23 12/23/2009, 12/21/2015   Td 05/11/2010   Zoster Recombinat (Shingrix) 06/27/2019, 09/17/2019   Zoster, Live 09/23/2008    Past Medical History:  Diagnosis Date   Atrophic vaginitis 11/18/2012   Back pain    Chicken pox as a child   Cough 07/14/2016   Dermatitis 04/26/2011   GERD (gastroesophageal reflux disease) 03/20/2017   High cholesterol    HTN (hypertension) 04/13/2010   Qualifier: Diagnosis of  By: Charlett Blake MD, Stacey     Hypertension    Measles as a child   Miscarriage 1981   Onychomycosis 04/26/2011   OSA (obstructive sleep apnea) 10/30/2017   Osteopenia 10/17/1999   Osteopenia 04/13/2010   Qualifier: Diagnosis of  By: Charlett Blake MD, Stacey     Preventative health care 07/14/2016   Screening for malignant neoplasm of the cervix 04/26/2011   Sleep apnea    Tinea pedis 05/10/2014   Urinary frequency 03/20/2017    Tobacco History: Social History   Tobacco Use  Smoking Status Former   Packs/day: 1.00   Years: 40.00   Pack years: 40.00   Types: Cigarettes   Quit date: 10/16/1994   Years since quitting: 27.1  Smokeless Tobacco Never   Counseling given: Not Answered   Outpatient Medications Prior to Visit  Medication Sig Dispense Refill   aspirin 81 MG tablet Take 81  mg by mouth daily.     azelastine (ASTELIN) 0.1 % nasal spray Place 1 spray into both nostrils daily as needed for rhinitis. Use in each nostril as directed 30 mL 5   b complex vitamins tablet Take 1 tablet by mouth daily.     calcium citrate-vitamin D (CITRACAL+D) 315-200 MG-UNIT per tablet Take 1 tablet by mouth 2 (two) times daily.     Methylsulfonylmethane (MSM PO) Take 2 capsules by mouth daily. 1,000 mg 3 x daily     multivitamin (THERAGRAN) per tablet Take 1 tablet by mouth daily.     mupirocin ointment (BACTROBAN) 2 % APPLY INTO THE NOSE AT BEDTIME AS NEEDED. 22 g 1   Nutritional Supplements (VITAMIN D  BOOSTER PO) Take by mouth.     Omega-3 Fatty Acids-Vitamin E (COROMEGA PO) Take by mouth.     PREMARIN vaginal cream APPLY A SMALL AMOUNT (APPROXIMATELY 0.25G)PER VAGINA 3 TIMES A WEEK AT BEDTIME. 30 g 0   Probiotic Product (PROBIOTIC DAILY PO) Take by mouth daily.     Red Yeast Rice Extract (RED YEAST RICE PO) Take by mouth.     triamterene-hydrochlorothiazide (DYAZIDE) 37.5-25 MG capsule TAKE ONE CAPSULE DAILY 90 capsule 1   TURMERIC PO Take 1 capsule by mouth daily.      vitamin C (ASCORBIC ACID) 500 MG tablet Take 500 mg by mouth daily.     Zinc Acetate, Oral, (ZINC ACETATE PO) Take by mouth.     ezetimibe (ZETIA) 10 MG tablet Take 1 tablet (10 mg total) by mouth daily. 90 tablet 3   No facility-administered medications prior to visit.     Review of Systems:   Constitutional:   No  weight loss, night sweats,  Fevers, chills, fatigue, or  lassitude.  HEENT:   No headaches,  Difficulty swallowing,  Tooth/dental problems, or  Sore throat,                No sneezing, itching, ear ache, nasal congestion, post nasal drip,   CV:  No chest pain,  Orthopnea, PND, swelling in lower extremities, anasarca, dizziness, palpitations, syncope.   GI  No heartburn, indigestion, abdominal pain, nausea, vomiting, diarrhea, change in bowel habits, loss of appetite, bloody stools.   Resp: No shortness of breath with exertion or at rest.  No excess mucus, no productive cough,  No non-productive cough,  No coughing up of blood.  No change in color of mucus.  No wheezing.  No chest wall deformity  Skin: no rash or lesions.  GU: no dysuria, change in color of urine, no urgency or frequency.  No flank pain, no hematuria   MS:  No joint pain or swelling.  No decreased range of motion.  No back pain.    Physical Exam  BP 128/70 (BP Location: Left Arm, Patient Position: Sitting, Cuff Size: Normal)    Pulse 65    Temp 97.6 F (36.4 C) (Oral)    Ht 5\' 2"  (1.575 m)    Wt 186 lb 9.6 oz (84.6 kg)    SpO2 96%     BMI 34.13 kg/m   GEN: A/Ox3; pleasant , NAD, well nourished    HEENT:  [place]/AT,  NOSE-clear, THROAT-clear, no lesions, no postnasal drip or exudate noted. Class 2 MP airway   NECK:  Supple w/ fair ROM; no JVD; normal carotid impulses w/o bruits; no thyromegaly or nodules palpated; no lymphadenopathy.    RESP  Clear  P & A; w/o, wheezes/ rales/ or  rhonchi. no accessory muscle use, no dullness to percussion  CARD:  RRR, no m/r/g, no peripheral edema, pulses intact, no cyanosis or clubbing.  GI:   Soft & nt; nml bowel sounds; no organomegaly or masses detected.   Musco: Warm bil, no deformities or joint swelling noted.   Neuro: alert, no focal deficits noted.    Skin: Warm, no lesions or rashes    Lab Results:    BMET   BNP No results found for: BNP  ProBNP No results found for: PROBNP  Imaging: No results found.    No flowsheet data found.  No results found for: NITRICOXIDE      Assessment & Plan:   OSA (obstructive sleep apnea) Encouraged on CPAP compliance. - discussed how weight can impact sleep and risk for sleep disordered breathing - discussed options to assist with weight loss: combination of diet modification, cardiovascular and strength training exercises   - had an extensive discussion regarding the adverse health consequences related to untreated sleep disordered breathing - specifically discussed the risks for hypertension, coronary artery disease, cardiac dysrhythmias, cerebrovascular disease, and diabetes - lifestyle modification discussed   - discussed how sleep disruption can increase risk of accidents, particularly when driving - safe driving practices were discussed   Plan  Patient Instructions  May try Allegra 180mg  daily in place Claritin.  Chlorpheniramine 4mg  (Chlortab) At bedtime  As needed  Drainage  Saline nasal spray Twice daily   Saline nasal gel At bedtime  .  Flonase 1 puff daily in am  Astelin 1 puffs daily in pm .   Continue on CPAP At bedtime   Melatonin At bedtime  As needed  Insomnia .  Try to wear each night for at least 6hrs each night  Stay active , healthy diet  Do not drive if sleepy  Refer to lung cancer screening program.  Follow up with Dr. Halford Chessman or Murel Wigle NP  In 1 year and As needed       History of tobacco abuse Patient quit smoking 10 years ago.  She has a 40-pack-year history.  Discussed the lung cancer screening program.  Patient would like to be referred.  Plan  Patient Instructions  May try Allegra 180mg  daily in place Claritin.  Chlorpheniramine 4mg  (Chlortab) At bedtime  As needed  Drainage  Saline nasal spray Twice daily   Saline nasal gel At bedtime  .  Flonase 1 puff daily in am  Astelin 1 puffs daily in pm .  Continue on CPAP At bedtime   Melatonin At bedtime  As needed  Insomnia .  Try to wear each night for at least 6hrs each night  Stay active , healthy diet  Do not drive if sleepy  Refer to lung cancer screening program.  Follow up with Dr. Halford Chessman or Markeis Allman NP  In 1 year and As needed       Chronic rhinitis Chronic allergic rhinitis.-Change Claritin to Allegra.  Continue on Chlortab at bedtime.  And would continue on Flonase and Astelin.  Plan  Patient Instructions  May try Allegra 180mg  daily in place Claritin.  Chlorpheniramine 4mg  (Chlortab) At bedtime  As needed  Drainage  Saline nasal spray Twice daily   Saline nasal gel At bedtime  .  Flonase 1 puff daily in am  Astelin 1 puffs daily in pm .  Continue on CPAP At bedtime   Melatonin At bedtime  As needed  Insomnia .  Try to wear each night for at  least 6hrs each night  Stay active , healthy diet  Do not drive if sleepy  Refer to lung cancer screening program.  Follow up with Dr. Halford Chessman or Neola Worrall NP  In 1 year and As needed         Rexene Edison, NP 12/08/2021

## 2021-12-08 NOTE — Assessment & Plan Note (Signed)
Patient quit smoking 10 years ago.  She has a 40-pack-year history.  Discussed the lung cancer screening program.  Patient would like to be referred.  Plan  Patient Instructions  May try Allegra 180mg  daily in place Claritin.  Chlorpheniramine 4mg  (Chlortab) At bedtime  As needed  Drainage  Saline nasal spray Twice daily   Saline nasal gel At bedtime  .  Flonase 1 puff daily in am  Astelin 1 puffs daily in pm .  Continue on CPAP At bedtime   Melatonin At bedtime  As needed  Insomnia .  Try to wear each night for at least 6hrs each night  Stay active , healthy diet  Do not drive if sleepy  Refer to lung cancer screening program.  Follow up with Dr. Halford Chessman or Hyatt Capobianco NP  In 1 year and As needed

## 2021-12-08 NOTE — Assessment & Plan Note (Signed)
Encouraged on CPAP compliance. - discussed how weight can impact sleep and risk for sleep disordered breathing - discussed options to assist with weight loss: combination of diet modification, cardiovascular and strength training exercises   - had an extensive discussion regarding the adverse health consequences related to untreated sleep disordered breathing - specifically discussed the risks for hypertension, coronary artery disease, cardiac dysrhythmias, cerebrovascular disease, and diabetes - lifestyle modification discussed   - discussed how sleep disruption can increase risk of accidents, particularly when driving - safe driving practices were discussed   Plan  Patient Instructions  May try Allegra 180mg  daily in place Claritin.  Chlorpheniramine 4mg  (Chlortab) At bedtime  As needed  Drainage  Saline nasal spray Twice daily   Saline nasal gel At bedtime  .  Flonase 1 puff daily in am  Astelin 1 puffs daily in pm .  Continue on CPAP At bedtime   Melatonin At bedtime  As needed  Insomnia .  Try to wear each night for at least 6hrs each night  Stay active , healthy diet  Do not drive if sleepy  Refer to lung cancer screening program.  Follow up with Dr. Halford Chessman or Shivaay Stormont NP  In 1 year and As needed

## 2021-12-15 DIAGNOSIS — H5211 Myopia, right eye: Secondary | ICD-10-CM | POA: Diagnosis not present

## 2021-12-15 DIAGNOSIS — H2513 Age-related nuclear cataract, bilateral: Secondary | ICD-10-CM | POA: Diagnosis not present

## 2021-12-15 DIAGNOSIS — H5202 Hypermetropia, left eye: Secondary | ICD-10-CM | POA: Diagnosis not present

## 2021-12-22 ENCOUNTER — Encounter: Payer: Self-pay | Admitting: Family Medicine

## 2021-12-22 ENCOUNTER — Other Ambulatory Visit (HOSPITAL_BASED_OUTPATIENT_CLINIC_OR_DEPARTMENT_OTHER): Payer: Self-pay

## 2021-12-22 ENCOUNTER — Ambulatory Visit (HOSPITAL_BASED_OUTPATIENT_CLINIC_OR_DEPARTMENT_OTHER)
Admission: RE | Admit: 2021-12-22 | Discharge: 2021-12-22 | Disposition: A | Discharge status: Home / Self Care | Payer: Medicare HMO | Source: Ambulatory Visit | Attending: Family Medicine | Admitting: Family Medicine

## 2021-12-22 ENCOUNTER — Ambulatory Visit (INDEPENDENT_AMBULATORY_CARE_PROVIDER_SITE_OTHER): Payer: Medicare HMO | Admitting: Family Medicine

## 2021-12-22 ENCOUNTER — Other Ambulatory Visit: Payer: Self-pay

## 2021-12-22 VITALS — BP 140/90 | HR 57 | Temp 98.6°F | Resp 18 | Ht 62.0 in | Wt 178.4 lb

## 2021-12-22 DIAGNOSIS — M25561 Pain in right knee: Secondary | ICD-10-CM

## 2021-12-22 DIAGNOSIS — L853 Xerosis cutis: Secondary | ICD-10-CM | POA: Diagnosis not present

## 2021-12-22 MED ORDER — AMMONIUM LACTATE 12 % EX CREA
1.0000 "application " | TOPICAL_CREAM | CUTANEOUS | 0 refills | Status: DC | PRN
  Filled 2021-12-22: qty 280, 20d supply, fill #0

## 2021-12-22 NOTE — Assessment & Plan Note (Signed)
Ice / heat ?Knee sleeve ?Pt did not want pain meds ?con't voltaren prn ?Xray today ?Consider sport med ?

## 2021-12-22 NOTE — Progress Notes (Signed)
Subjective:   By signing my name below, I, Mandy [name], attest that this documentation has been prepared under the direction and in the presence of Mandy Held, DO. 12/22/2021    Patient ID: Mandy [name], female    DOB: 1946-01-28, 76 y.o.   MRN: [id number]  Chief Complaint  Patient presents with   Leg Pain    Right leg and knee pain, no fall, Pt states she went to sit on her bed x1 month ago and it felt like a pull.     Leg Pain   Patient is in today for a office visit.   She complains of left lateral knee pain. She reports she sat down wrong on her bed which caused her to pull her muscle. She does not think there was pop sound at the time of injury. Her left knee is swollen at this time. She is applying ice and heat separately on her left knee. She also applies Voltaren gel and finds mild relief. She finds since developing the pain she cannot walk as much as she typically does. She has no pain while sleeping and finds mild relief while laying down. She also feels sensation of her left knee "giving out". She has not seen a specialist for her knee prior to this injury.  She also notes since her knee pain started she also developed right hip pain.  She complains of dry skin on both hands fingers. She applies lotion regularly but only finds mild relief. Her hands are not typically in water.    Past Medical History:  Diagnosis Date   Atrophic vaginitis 11/18/2012   Back pain    Chicken pox as a child   Cough 07/14/2016   Dermatitis 04/26/2011   GERD (gastroesophageal reflux disease) 03/20/2017   High cholesterol    HTN (hypertension) 04/13/2010   Qualifier: Diagnosis of  By: Charlett Blake MD, Stacey     Hypertension    Measles as a child   Miscarriage 1981   Onychomycosis 04/26/2011   OSA (obstructive sleep apnea) 10/30/2017   Osteopenia 10/17/1999   Osteopenia 04/13/2010   Qualifier: Diagnosis of  By: Charlett Blake MD, Stacey     Preventative health care 07/14/2016   Screening for malignant  neoplasm of the cervix 04/26/2011   Sleep apnea    Tinea pedis 05/10/2014   Urinary frequency 03/20/2017    Past Surgical History:  Procedure Laterality Date   DILATION AND CURETTAGE OF UTERUS  1981   miscarriage    Family History  Problem Relation Age of Onset   Osteoporosis Mother    Coronary artery disease Mother    Heart attack Mother 38   Hypertension Mother    Dementia Mother    Hyperlipidemia Mother    Stroke Mother    Obesity Mother    Diabetes Father        type 2   Heart disease Father    Stroke Father    Obesity Father    Dementia Brother        Lewy Body   Parkinson's disease Brother    Birth defects Maternal Aunt    Dementia Maternal Grandmother    Asthma Maternal Grandfather    Cancer Paternal Grandmother        ovarian   Dementia Paternal Grandmother    Diabetes Paternal Grandfather        tyoe 2   Hearing loss Paternal Grandfather    Arthritis Other    Hyperlipidemia Other  Hypertension Other    Other Other        Cardiovascular disorder    Social History   Socioeconomic History   Marital status: Married    Spouse name: Mandy [name]   Number of children: Not on file   Years of education: Not on file   Highest education level: Not on file  Occupational History   Occupation: bookkeeper  Tobacco Use   Smoking status: Former    Packs/day: 1.00    Years: 40.00    Pack years: 40.00    Types: Cigarettes    Quit date: 10/16/1994    Years since quitting: 27.2   Smokeless tobacco: Never  Vaping Use   Vaping Use: Never used  Substance and Sexual Activity   Alcohol use: No   Drug use: No   Sexual activity: Yes    Partners: Male  Other Topics Concern   Not on file  Social History Narrative   Regular exercise- yes   Does Yoga 3-4 X week, personal trainer 1 X week, and walks regularly   Has lost 16 pounds by minimizing processed foods and meats   Social Determinants of Health   Financial Resource Strain: Not on file  Food Insecurity: Not on file   Transportation Needs: Not on file  Physical Activity: Not on file  Stress: Not on file  Social Connections: Not on file  Intimate Partner Violence: Not on file    Outpatient Medications Prior to Visit  Medication Sig Dispense Refill   aspirin 81 MG tablet Take 81 mg by mouth daily.     azelastine (ASTELIN) 0.1 % nasal spray Place 1 spray into both nostrils daily as needed for rhinitis. Use in each nostril as directed 30 mL 5   b complex vitamins tablet Take 1 tablet by mouth daily.     calcium citrate-vitamin D (CITRACAL+D) 315-200 MG-UNIT per tablet Take 1 tablet by mouth 2 (two) times daily.     Methylsulfonylmethane (MSM PO) Take 2 capsules by mouth daily. 1,000 mg 3 x daily     multivitamin (THERAGRAN) per tablet Take 1 tablet by mouth daily.     mupirocin ointment (BACTROBAN) 2 % APPLY INTO THE NOSE AT BEDTIME AS NEEDED. 22 g 1   Nutritional Supplements (VITAMIN D BOOSTER PO) Take by mouth.     Omega-3 Fatty Acids-Vitamin E (COROMEGA PO) Take by mouth.     PREMARIN vaginal cream APPLY A SMALL AMOUNT (APPROXIMATELY 0.25G)PER VAGINA 3 TIMES A WEEK AT BEDTIME. 30 g 0   Probiotic Product (PROBIOTIC DAILY PO) Take by mouth daily.     Red Yeast Rice Extract (RED YEAST RICE PO) Take by mouth.     triamterene-hydrochlorothiazide (DYAZIDE) 37.5-25 MG capsule TAKE ONE CAPSULE DAILY 90 capsule 1   TURMERIC PO Take 1 capsule by mouth daily.      vitamin C (ASCORBIC ACID) 500 MG tablet Take 500 mg by mouth daily.     Zinc Acetate, Oral, (ZINC ACETATE PO) Take by mouth.     ezetimibe (ZETIA) 10 MG tablet Take 1 tablet (10 mg total) by mouth daily. 90 tablet 3   No facility-administered medications prior to visit.    Allergies  Allergen Reactions   Codeine    Pneumococcal Vaccine Polyvalent     REACTION: rash at injection site and induration.   Red Yeast Rice Extract     Leg pain    Review of Systems  Musculoskeletal:        (+)left knee pain (+)  right hip pain (+)swollen left knee   Skin:        (+)dry skin on all bilateral fingers      Objective:    Physical Exam Constitutional:      General: She is not in acute distress.    Appearance: Normal appearance. She is not ill-appearing.  HENT:     Head: Normocephalic and atraumatic.     Right Ear: External ear normal.     Left Ear: External ear normal.  Eyes:     Extraocular Movements: Extraocular movements intact.     Pupils: Pupils are equal, round, and reactive to light.  Cardiovascular:     Rate and Rhythm: Normal rate and regular rhythm.     Heart sounds: Normal heart sounds. No murmur heard.   No gallop.  Pulmonary:     Effort: Pulmonary effort is normal. No respiratory distress.     Breath sounds: Normal breath sounds. No wheezing or rales.  Musculoskeletal:     Comments: 5/5 strength in Right lower extremity Pain while palpating lateral R knee  + swelling in R knee  No errythema  Not hot to touch  Skin:    General: Skin is warm and dry.     Comments: Dry and cracked fingers on bilateral hands  Neurological:     Mental Status: She is alert and oriented to person, place, and time.  Psychiatric:        Judgment: Judgment normal.    BP 140/90 (BP Location: Left Arm, Patient Position: Sitting, Cuff Size: Normal)    Pulse (!) 57    Temp 98.6 F (37 C) (Oral)    Resp 18    Ht '5\' 2"'  (1.575 m)    Wt 178 lb 6.4 oz (80.9 kg)    SpO2 97%    BMI 32.63 kg/m  Wt Readings from Last 3 Encounters:  12/22/21 178 lb 6.4 oz (80.9 kg)  12/08/21 186 lb 9.6 oz (84.6 kg)  09/01/21 166 lb (75.3 kg)    Diabetic Foot Exam - Simple   No data filed    Lab Results  Component Value Date   WBC 5.0 06/17/2020   HGB 14.3 06/17/2020   HCT 44.0 06/17/2020   PLT 329 06/17/2020   GLUCOSE 87 06/08/2021   CHOL 188 06/08/2021   TRIG 85 06/08/2021   HDL 69 06/08/2021   LDLDIRECT 113.4 11/12/2012   LDLCALC 104 (H) 06/08/2021   ALT 14 06/08/2021   AST 22 06/08/2021   NA 140 06/08/2021   K 4.7 06/08/2021   CL 101  06/08/2021   CREATININE 0.79 06/08/2021   BUN 28 (H) 06/08/2021   CO2 29 06/08/2021   TSH 2.70 06/21/2021   HGBA1C 5.4 06/08/2021    Lab Results  Component Value Date   TSH 2.70 06/21/2021   Lab Results  Component Value Date   WBC 5.0 06/17/2020   HGB 14.3 06/17/2020   HCT 44.0 06/17/2020   MCV 100 (H) 06/17/2020   PLT 329 06/17/2020   Lab Results  Component Value Date   NA 140 06/08/2021   K 4.7 06/08/2021   CO2 29 06/08/2021   GLUCOSE 87 06/08/2021   BUN 28 (H) 06/08/2021   CREATININE 0.79 06/08/2021   BILITOT 0.5 06/08/2021   ALKPHOS 52 06/08/2021   AST 22 06/08/2021   ALT 14 06/08/2021   PROT 6.3 06/08/2021   ALBUMIN 4.3 06/08/2021   CALCIUM 9.7 06/08/2021   EGFR 77 06/08/2021   GFR 61.82  02/05/2020   Lab Results  Component Value Date   CHOL 188 06/08/2021   Lab Results  Component Value Date   HDL 69 06/08/2021   Lab Results  Component Value Date   LDLCALC 104 (H) 06/08/2021   Lab Results  Component Value Date   TRIG 85 06/08/2021   Lab Results  Component Value Date   CHOLHDL 2.7 06/08/2021   Lab Results  Component Value Date   HGBA1C 5.4 06/08/2021       Assessment & Plan:   Problem List Items Addressed This Visit       Unprioritized   Acute pain of right knee - Primary    Ice / heat Knee sleeve Pt did not want pain meds con't voltaren prn Xray today Consider sport med      Relevant Orders   DG Knee Complete 4 Views Right   Dry skin dermatitis   Relevant Medications   ammonium lactate (LAC-HYDRIN) 12 % cream     Meds ordered this encounter  Medications   ammonium lactate (LAC-HYDRIN) 12 % cream    Sig: Apply 1 application on to the skin as needed for dry skin.    Dispense:  385 g    Refill:  0    I, Afreen Held, DO, personally preformed the services described in this documentation.  All medical record entries made by the scribe were at my direction and in my presence.  I have reviewed the chart and discharge  instructions (if applicable) and agree that the record reflects my personal performance and is accurate and complete. 12/22/2021   I,Mandy [name],acting as a scribe for Francia Held, DO.,have documented all relevant documentation on the behalf of Sheniece Held, DO,as directed by  Lamoyne Held, DO while in the presence of Loral Held, DO.   Bambie Held, DO

## 2021-12-22 NOTE — Patient Instructions (Signed)
Acute Knee Pain, Adult Acute knee pain is sudden and may be caused by damage, swelling, or irritation of the muscles and tissues that support the knee. Pain may result from: A fall. An injury to the knee from twisting motions. A hit to the knee. Infection. Acute knee pain may go away on its own with time and rest. If it does not, your health care provider may order tests to find the cause of the pain. These may include: Imaging tests, such as an X-ray, MRI, CT scan, or ultrasound. Joint aspiration. In this test, fluid is removed from the knee and evaluated. Arthroscopy. In this test, a lighted tube is inserted into the knee and an image is projected onto a TV screen. Biopsy. In this test, a sample of tissue is removed from the body and studied under a microscope. Follow these instructions at home: If you have a knee sleeve or brace:  Wear the knee sleeve or brace as told by your health care provider. Remove it only as told by your health care provider. Loosen it if your toes tingle, become numb, or turn cold and blue. Keep it clean. If the knee sleeve or brace is not waterproof: Do not let it get wet. Cover it with a watertight covering when you take a bath or shower.  Activity Rest your knee. Do not do things that cause pain or make pain worse. Avoid high-impact activities or exercises, such as running, jumping rope, or doing jumping jacks. Work with a physical therapist to make a safe exercise program, as recommended by your health care provider. Do exercises as told by your physical therapist. Managing pain, stiffness, and swelling  If directed, put ice on the affected knee. To do this: If you have a removable knee sleeve or brace, remove it as told by your health care provider. Put ice in a plastic bag. Place a towel between your skin and the bag. Leave the ice on for 20 minutes, 2-3 times a day. Remove the ice if your skin turns bright red. This is very important. If you cannot  feel pain, heat, or cold, you have a greater risk of damage to the area. If directed, use an elastic bandage to put pressure (compression) on your injured knee. This may control swelling, give support, and help with discomfort. Raise (elevate) your knee above the level of your heart while you are sitting or lying down. Sleep with a pillow under your knee.  General instructions Take over-the-counter and prescription medicines only as told by your health care provider. Do not use any products that contain nicotine or tobacco, such as cigarettes, e-cigarettes, and chewing tobacco. If you need help quitting, ask your health care provider. If you are overweight, work with your health care provider and a dietitian to set a weight-loss goal that is healthy and reasonable for you. Extra weight can put pressure on your knee. Pay attention to any changes in your symptoms. Keep all follow-up visits. This is important. Contact a health care provider if: Your knee pain continues, changes, or gets worse. You have a fever along with knee pain. Your knee feels warm to the touch or is red. Your knee buckles or locks up. Get help right away if: Your knee swells, and the swelling becomes worse. You cannot move your knee. You have severe pain in your knee that cannot be managed with pain medicine. Summary Acute knee pain can be caused by a fall, an injury, an infection, or damage, swelling,   or irritation of the tissues that support your knee. Your health care provider may perform tests to find out the cause of the pain. Pay attention to any changes in your symptoms. Relieve your pain with rest, medicines, light activity, and the use of ice. Get help right away if your knee swells, you cannot move your knee, or you have severe pain that cannot be managed with medicine. This information is not intended to replace advice given to you by your health care provider. Make sure you discuss any questions you have with  your healthcare provider. Document Revised: 03/17/2020 Document Reviewed: 03/17/2020 Elsevier Patient Education  2022 [employer] Inc.  

## 2021-12-23 ENCOUNTER — Telehealth: Payer: Self-pay | Admitting: Family Medicine

## 2021-12-23 ENCOUNTER — Other Ambulatory Visit (HOSPITAL_BASED_OUTPATIENT_CLINIC_OR_DEPARTMENT_OTHER): Payer: Self-pay

## 2021-12-23 ENCOUNTER — Other Ambulatory Visit: Payer: Self-pay

## 2021-12-23 NOTE — Telephone Encounter (Signed)
We don't do joint shots here ?

## 2021-12-23 NOTE — Telephone Encounter (Signed)
LVM making pt aware that we dont give those type of shots in the office.  ?

## 2021-12-23 NOTE — Telephone Encounter (Signed)
Patient states she has a visit with ortho in 2 weeks but would like to come in for a shot in her knee today if possible. Pt saw Lowne on  03/09. Please advise.  ?

## 2022-01-04 ENCOUNTER — Ambulatory Visit (INDEPENDENT_AMBULATORY_CARE_PROVIDER_SITE_OTHER): Payer: Medicare HMO | Admitting: Orthopedic Surgery

## 2022-01-04 ENCOUNTER — Other Ambulatory Visit: Payer: Self-pay

## 2022-01-04 DIAGNOSIS — M659 Synovitis and tenosynovitis, unspecified: Secondary | ICD-10-CM

## 2022-01-07 ENCOUNTER — Encounter: Payer: Self-pay | Admitting: Orthopedic Surgery

## 2022-01-07 NOTE — Progress Notes (Signed)
? ?Office Visit Note ?  ?Patient: Mandy [name]           ?Date of Birth: 20-Apr-1946           ?MRN: [id number] ?Visit Date: 01/04/2022 ?Requested by: Mosie Lukes, MD ?[facility] ?[address] ?[place],  [place] [address] ?PCP: Mosie Lukes, MD ? ?Subjective: ?Chief Complaint  ?Patient presents with  ? Right Knee - Pain  ? ? ?HPI: Holcomb is a 76 year old patient with right knee pain.  Denies any history of injury.  Pain been going on for 2 weeks.  Worse with weightbearing.  She went from a figure-of-four sitting position to standing and she developed pain and swelling immediately after that.  Before this event happened several weeks ago she had no problem with the knee.  She likes to workout 3 times a week as well as walk and she has a Physiological scientist.  She has tried Aleve Voltaren and Ultram from her friend.  Is also used ice and heat.  Radiographs on the system demonstrate only mild degenerative changes in the medial compartment.  Overall the knee looks very good from an arthritic standpoint in the 76 year old patient. ?             ?ROS: All systems reviewed are negative as they relate to the chief complaint within the history of present illness.  Patient denies  fevers or chills. ? ? ?Assessment & Plan: ?Visit Diagnoses:  ?1. Synovitis of right knee   ? ? ?Plan: Impression is right knee pain and some swelling following standing up from the crosslegged position.  I think she may have meniscal pathology.  Plan is injection today in the right knee to see if we can calm that down with return office visit in 3 weeks to decide for or against MRI scanning.  Relatively high incidence of false positive findings for patients in this age group but the index of suspicion is high for meniscal pathology based on mechanism of injury as well as her preinjury and post injury activity level. ? ?Follow-Up Instructions: Return in about 3 weeks (around 01/25/2022).  ? ?Orders:  ?No orders of the defined types were placed in  this encounter. ? ?No orders of the defined types were placed in this encounter. ? ? ? ? Procedures: ?No procedures performed ? ? ?Clinical Data: ?No additional findings. ? ?Objective: ?Vital Signs: There were no vitals taken for this visit. ? ?Physical Exam:  ? ?Constitutional: Patient appears well-developed ?HEENT:  ?Head: Normocephalic ?Eyes:EOM are normal ?Neck: Normal range of motion ?Cardiovascular: Normal rate ?Pulmonary/chest: Effort normal ?Neurologic: Patient is alert ?Skin: Skin is warm ?Psychiatric: Patient has normal mood and affect ? ? ?Ortho Exam: Ortho exam demonstrates mild effusion right knee with stable collateral and cruciate ligaments.  Patient does have positive McMurray compression testing as well as medial and lateral joint line tenderness.  Range of motion is full.  No groin pain with internal/external rotation of the right leg.  Pedal pulses palpable.  No other masses lymphadenopathy or skin changes noted in that right knee region ? ?Specialty Comments:  ?No specialty comments available. ? ?Imaging: ?No results found. ? ? ?PMFS History: ?Patient Active Problem List  ? Diagnosis Date Noted  ? Dry skin dermatitis 12/22/2021  ? Acute pain of right knee 12/22/2021  ? History of tobacco abuse 12/08/2021  ? Chronic rhinitis 12/03/2020  ? Hyperglycemia 06/28/2020  ? Class 1 obesity with serious comorbidity and body mass index (  BMI) of 32.0 to 32.9 in adult 06/28/2020  ? Other fatigue 06/17/2020  ? Essential hypertension 06/17/2020  ? Other hyperlipidemia 06/17/2020  ? Low back pain 03/30/2019  ? OSA (obstructive sleep apnea) 10/30/2017  ? Frequent nocturnal awakening 07/27/2017  ? GERD (gastroesophageal reflux disease) 03/20/2017  ? Urinary frequency 03/20/2017  ? Preventative health care 07/14/2016  ? Cough 07/14/2016  ? Medicare annual wellness visit, subsequent 05/10/2014  ? Tinea pedis 05/10/2014  ? Atrophic vaginitis 11/18/2012  ? Screening for malignant neoplasm of cervix 04/26/2011  ?  Onychomycosis 04/26/2011  ? Mixed hyperlipidemia 05/11/2010  ? Vitamin D deficiency 04/13/2010  ? CARPAL TUNNEL SYNDROME, RIGHT, MILD 04/13/2010  ? TRIGGER FINGER, RIGHT THUMB 04/13/2010  ? Osteopenia 04/13/2010  ? MUMPS, [place] OF 04/13/2010  ? CHICKENPOX, HX OF 04/13/2010  ? Sinusitis, acute 01/11/2009  ? ?Past Medical History:  ?Diagnosis Date  ? Atrophic vaginitis 11/18/2012  ? Back pain   ? Chicken pox as a child  ? Cough 07/14/2016  ? Dermatitis 04/26/2011  ? GERD (gastroesophageal reflux disease) 03/20/2017  ? High cholesterol   ? HTN (hypertension) 04/13/2010  ? Qualifier: Diagnosis of  By: Charlett Blake MD, Erline Levine    ? Hypertension   ? Measles as a child  ? Miscarriage 1981  ? Onychomycosis 04/26/2011  ? OSA (obstructive sleep apnea) 10/30/2017  ? Osteopenia 10/17/1999  ? Osteopenia 04/13/2010  ? Qualifier: Diagnosis of  By: Charlett Blake MD, Erline Levine    ? Preventative health care 07/14/2016  ? Screening for malignant neoplasm of the cervix 04/26/2011  ? Sleep apnea   ? Tinea pedis 05/10/2014  ? Urinary frequency 03/20/2017  ?  ?Family History  ?Problem Relation Age of Onset  ? Osteoporosis Mother   ? Coronary artery disease Mother   ? Heart attack Mother 28  ? Hypertension Mother   ? Dementia Mother   ? Hyperlipidemia Mother   ? Stroke Mother   ? Obesity Mother   ? Diabetes Father   ?     type 2  ? Heart disease Father   ? Stroke Father   ? Obesity Father   ? Dementia Brother   ?     Lewy Body  ? Parkinson's disease Brother   ? Birth defects Maternal Aunt   ? Dementia Maternal Grandmother   ? Asthma Maternal Grandfather   ? Cancer Paternal Grandmother   ?     ovarian  ? Dementia Paternal Grandmother   ? Diabetes Paternal Grandfather   ?     tyoe 2  ? Hearing loss Paternal Grandfather   ? Arthritis Other   ? Hyperlipidemia Other   ? Hypertension Other   ? Other Other   ?     Cardiovascular disorder  ?  ?Past Surgical History:  ?Procedure Laterality Date  ? DILATION AND CURETTAGE OF UTERUS  1981  ? miscarriage  ? ?Social History  ? ?Occupational  History  ? Occupation: bookkeeper  ?Tobacco Use  ? Smoking status: Former  ?  Packs/day: 1.00  ?  Years: 40.00  ?  Pack years: 40.00  ?  Types: Cigarettes  ?  Quit date: 10/16/1994  ?  Years since quitting: 27.2  ? Smokeless tobacco: Never  ?Vaping Use  ? Vaping Use: Never used  ?Substance and Sexual Activity  ? Alcohol use: No  ? Drug use: No  ? Sexual activity: Yes  ?  Partners: Male  ? ? ? ? ? ?

## 2022-01-10 DIAGNOSIS — G4733 Obstructive sleep apnea (adult) (pediatric): Secondary | ICD-10-CM | POA: Diagnosis not present

## 2022-01-23 ENCOUNTER — Other Ambulatory Visit: Payer: Self-pay | Admitting: *Deleted

## 2022-01-23 DIAGNOSIS — Z122 Encounter for screening for malignant neoplasm of respiratory organs: Secondary | ICD-10-CM

## 2022-01-23 DIAGNOSIS — Z87891 Personal history of nicotine dependence: Secondary | ICD-10-CM

## 2022-01-25 ENCOUNTER — Encounter: Payer: Self-pay | Admitting: Orthopedic Surgery

## 2022-01-25 ENCOUNTER — Ambulatory Visit: Payer: Medicare HMO | Admitting: Surgical

## 2022-01-25 DIAGNOSIS — M659 Synovitis and tenosynovitis, unspecified: Secondary | ICD-10-CM

## 2022-01-29 ENCOUNTER — Encounter: Payer: Self-pay | Admitting: Orthopedic Surgery

## 2022-01-29 NOTE — Progress Notes (Signed)
? ?Office Visit Note ?  ?Patient: Mandy [name]           ?Date of Birth: 07-30-1946           ?MRN: [id number] ?Visit Date: 01/25/2022 ?Requested by: Mosie Lukes, MD ?[facility] ?[address] ?[place],  [place] [address] ?PCP: Mosie Lukes, MD ? ?Subjective: ?Chief Complaint  ?Patient presents with  ? Right Knee - Follow-up  ? ? ?HPI: Mandy [name] is a 76 y.o. female who presents to the office for follow-up of right knee injection that was done on 01/04/2022.  She is currently experiencing 0/10 pain.  All of her symptoms have resolved regarding her right knee.  Denies any mechanical symptoms or any significant instability.  She has no stiffness, aching, sharp, throbbing sensation.  Not having to take any medications for pain.  Denies any groin pain. ?             ?ROS: All systems reviewed are negative as they relate to the chief complaint within the history of present illness.  Patient denies fevers or chills. ? ?Assessment & Plan: ?Visit Diagnoses:  ?1. Synovitis of right knee   ? ? ?Plan: Patient is a 76 year old female who returns following right knee injection on 01/04/2022.  She has had 100% relief of her knee pain with no recurrence.  Not having to take any medications for pain.  No mechanical symptoms or instability symptoms of the knee.  Patient has had excellent relief and will continue with mild loadbearing quadricep strengthening exercises.  Follow-up with the office as needed.  She understands that these injections can be repeated after 3 to 4 months if needed. ? ?Follow-Up Instructions: No follow-ups on file.  ? ?Orders:  ?No orders of the defined types were placed in this encounter. ? ?No orders of the defined types were placed in this encounter. ? ? ? ? Procedures: ?No procedures performed ? ? ?Clinical Data: ?No additional findings. ? ?Objective: ?Vital Signs: There were no vitals taken for this visit. ? ?Physical Exam:  ?Constitutional: Patient appears well-developed ?HEENT:  ?Head:  Normocephalic ?Eyes:EOM are normal ?Neck: Normal range of motion ?Cardiovascular: Normal rate ?Pulmonary/chest: Effort normal ?Neurologic: Patient is alert ?Skin: Skin is warm ?Psychiatric: Patient has normal mood and affect ? ?Ortho Exam: Ortho exam demonstrates right knee with full active and passive range of motion.  No cellulitis or skin changes noted.  No significant effusion.  No tenderness in the calf.  Minimal joint line tenderness.  Negative Homans' sign.  Able to perform straight leg raise.  Ambulates without any significant antalgia. ? ?Specialty Comments:  ?No specialty comments available. ? ?Imaging: ?No results found. ? ? ?PMFS History: ?Patient Active Problem List  ? Diagnosis Date Noted  ? Dry skin dermatitis 12/22/2021  ? Acute pain of right knee 12/22/2021  ? History of tobacco abuse 12/08/2021  ? Chronic rhinitis 12/03/2020  ? Hyperglycemia 06/28/2020  ? Class 1 obesity with serious comorbidity and body mass index (BMI) of 32.0 to 32.9 in adult 06/28/2020  ? Other fatigue 06/17/2020  ? Essential hypertension 06/17/2020  ? Other hyperlipidemia 06/17/2020  ? Low back pain 03/30/2019  ? OSA (obstructive sleep apnea) 10/30/2017  ? Frequent nocturnal awakening 07/27/2017  ? GERD (gastroesophageal reflux disease) 03/20/2017  ? Urinary frequency 03/20/2017  ? Preventative health care 07/14/2016  ? Cough 07/14/2016  ? Medicare annual wellness visit, subsequent 05/10/2014  ? Tinea pedis 05/10/2014  ? Atrophic vaginitis 11/18/2012  ?  Screening for malignant neoplasm of cervix 04/26/2011  ? Onychomycosis 04/26/2011  ? Mixed hyperlipidemia 05/11/2010  ? Vitamin D deficiency 04/13/2010  ? CARPAL TUNNEL SYNDROME, RIGHT, MILD 04/13/2010  ? TRIGGER FINGER, RIGHT THUMB 04/13/2010  ? Osteopenia 04/13/2010  ? MUMPS, [place] OF 04/13/2010  ? CHICKENPOX, HX OF 04/13/2010  ? Sinusitis, acute 01/11/2009  ? ?Past Medical History:  ?Diagnosis Date  ? Atrophic vaginitis 11/18/2012  ? Back pain   ? Chicken pox as a child  ? Cough  07/14/2016  ? Dermatitis 04/26/2011  ? GERD (gastroesophageal reflux disease) 03/20/2017  ? High cholesterol   ? HTN (hypertension) 04/13/2010  ? Qualifier: Diagnosis of  By: Charlett Blake MD, Erline Levine    ? Hypertension   ? Measles as a child  ? Miscarriage 1981  ? Onychomycosis 04/26/2011  ? OSA (obstructive sleep apnea) 10/30/2017  ? Osteopenia 10/17/1999  ? Osteopenia 04/13/2010  ? Qualifier: Diagnosis of  By: Charlett Blake MD, Erline Levine    ? Preventative health care 07/14/2016  ? Screening for malignant neoplasm of the cervix 04/26/2011  ? Sleep apnea   ? Tinea pedis 05/10/2014  ? Urinary frequency 03/20/2017  ?  ?Family History  ?Problem Relation Age of Onset  ? Osteoporosis Mother   ? Coronary artery disease Mother   ? Heart attack Mother 76  ? Hypertension Mother   ? Dementia Mother   ? Hyperlipidemia Mother   ? Stroke Mother   ? Obesity Mother   ? Diabetes Father   ?     type 2  ? Heart disease Father   ? Stroke Father   ? Obesity Father   ? Dementia Brother   ?     Lewy Body  ? Parkinson's disease Brother   ? Birth defects Maternal Aunt   ? Dementia Maternal Grandmother   ? Asthma Maternal Grandfather   ? Cancer Paternal Grandmother   ?     ovarian  ? Dementia Paternal Grandmother   ? Diabetes Paternal Grandfather   ?     tyoe 2  ? Hearing loss Paternal Grandfather   ? Arthritis Other   ? Hyperlipidemia Other   ? Hypertension Other   ? Other Other   ?     Cardiovascular disorder  ?  ?Past Surgical History:  ?Procedure Laterality Date  ? DILATION AND CURETTAGE OF UTERUS  1981  ? miscarriage  ? ?Social History  ? ?Occupational History  ? Occupation: bookkeeper  ?Tobacco Use  ? Smoking status: Former  ?  Packs/day: 1.00  ?  Years: 40.00  ?  Pack years: 40.00  ?  Types: Cigarettes  ?  Quit date: 10/16/1994  ?  Years since quitting: 27.3  ? Smokeless tobacco: Never  ?Vaping Use  ? Vaping Use: Never used  ?Substance and Sexual Activity  ? Alcohol use: No  ? Drug use: No  ? Sexual activity: Yes  ?  Partners: Male  ? ? ? ? ?  ?

## 2022-02-03 ENCOUNTER — Ambulatory Visit (INDEPENDENT_AMBULATORY_CARE_PROVIDER_SITE_OTHER): Payer: Medicare HMO | Admitting: Acute Care

## 2022-02-03 ENCOUNTER — Encounter: Payer: Self-pay | Admitting: Acute Care

## 2022-02-03 DIAGNOSIS — Z87891 Personal history of nicotine dependence: Secondary | ICD-10-CM | POA: Diagnosis not present

## 2022-02-03 NOTE — Progress Notes (Addendum)
Virtual Visit via Telephone Note ? ?I connected with Mandy [name] on 08/30/21 at  2:00 PM EST by telephone and verified that I am speaking with the correct person using two identifiers. ? ?Location: ?Patient: Home ?Provider: Working from home ?  ?I discussed the limitations, risks, security and privacy concerns of performing an evaluation and management service by telephone and the availability of in person appointments. I also discussed with the patient that there may be a patient responsible charge related to this service. The patient expressed understanding and agreed to proceed. ? ?Shared Decision Making Visit Lung Cancer Screening Program ?[phone]) ? ? ?Eligibility: ?Age 76 y.o ?Pack Years Smoking History Calculation 94 ?(# packs/per year x # years smoked) ?Recent History of coughing up blood  no ?Unexplained weight loss? no ?( >Than 15 pounds within the last 6 months ) ?Prior History Lung / other cancer no ?(Diagnosis within the last 5 years already requiring surveillance chest CT Scans). ?Smoking Status Former Smoker ?Former Smokers: Years since quit: 10 years ? Quit Date: 2013 ? ?Visit Components: ?Discussion included one or more decision making aids. yes ?Discussion included risk/benefits of screening. yes ?Discussion included potential follow up diagnostic testing for abnormal scans. yes ?Discussion included meaning and risk of over diagnosis. yes ?Discussion included meaning and risk of False Positives. yes ?Discussion included meaning of total radiation exposure. yes ? ?Counseling Included: ?Importance of adherence to annual lung cancer LDCT screening. yes ?Impact of comorbidities on ability to participate in the program. yes ?Ability and willingness to under diagnostic treatment. yes ? ?Smoking Cessation Counseling: ?Current Smokers:  ?Discussed importance of smoking cessation. yes ?Information about tobacco cessation classes and interventions provided to patient. yes ?Patient provided with "ticket" for  LDCT Scan. yes ?Symptomatic Patient. no ? Counseling NA ?Diagnosis Code: Tobacco Use Z72.0 ?Asymptomatic Patient yes ? Counseling NA ?Former Smokers:  ?Discussed the importance of maintaining cigarette abstinence. yes ?Diagnosis Code: Personal History of Nicotine Dependence. C16.606 ?Information about tobacco cessation classes and interventions provided to patient. Yes ?Patient provided with "ticket" for LDCT Scan. yes ?Written Order for Lung Cancer Screening with LDCT placed in Epic. Yes ?(CT Chest Lung Cancer Screening Low Dose W/O CM) [id number] ?Z12.2-Screening of respiratory organs ?Z87.891-Personal history of nicotine dependence ? ? ?I spent 25 minutes of face to face time with her discussing the risks and benefits of lung cancer screening. We viewed a power point together that explained in detail the above noted topics. We took the time to pause the power point at intervals to allow for questions to be asked and answered to ensure understanding. We discussed that she had taken the single most powerful action possible to decrease her risk of developing lung cancer when he quit smoking. I counseled her to remain smoke free, and to contact me if she ever had the desire to smoke again so that I can provide resources and tools to help support the effort to remain smoke free. We discussed the time and location of the scan, and that either  Doroteo Glassman RN or I will call with the results within  24-48 hours of receiving them. She has my card and contact information in the event he needs to speak with me, in addition to a copy of the power point we reviewed as a resource. She verbalized understanding of all of the above and had no further questions upon leaving the office.  ? ? ? ?I explained to the patient that there has been a high incidence of  coronary artery disease noted on these exams. I explained that this is a non-gated exam therefore degree or severity cannot be determined. This patient is not on statin  therapy. I have asked the patient to follow-up with their PCP regarding any incidental finding of coronary artery disease and management with diet or medication as they feel is clinically indicated. The patient verbalized understanding of the above and had no further questions. ? ? ?Mallorie Norrod D. Harris, NP-C ?[facility] ?Personal contact information can be found on Amion  ?02/03/2022, 10:14 AM ? ? ? ? ? ? ? ? ? ?

## 2022-02-03 NOTE — Patient Instructions (Signed)

## 2022-02-06 ENCOUNTER — Ambulatory Visit (HOSPITAL_BASED_OUTPATIENT_CLINIC_OR_DEPARTMENT_OTHER)
Admission: RE | Admit: 2022-02-06 | Discharge: 2022-02-06 | Disposition: A | Discharge status: Home / Self Care | Payer: Medicare HMO | Source: Ambulatory Visit | Attending: Acute Care | Admitting: Acute Care

## 2022-02-06 DIAGNOSIS — Z122 Encounter for screening for malignant neoplasm of respiratory organs: Secondary | ICD-10-CM | POA: Diagnosis present

## 2022-02-06 DIAGNOSIS — Z87891 Personal history of nicotine dependence: Secondary | ICD-10-CM | POA: Diagnosis not present

## 2022-02-07 ENCOUNTER — Other Ambulatory Visit: Payer: Self-pay | Admitting: Acute Care

## 2022-02-07 DIAGNOSIS — Z122 Encounter for screening for malignant neoplasm of respiratory organs: Secondary | ICD-10-CM

## 2022-02-07 DIAGNOSIS — Z87891 Personal history of nicotine dependence: Secondary | ICD-10-CM

## 2022-02-14 DIAGNOSIS — H25012 Cortical age-related cataract, left eye: Secondary | ICD-10-CM | POA: Diagnosis not present

## 2022-02-14 DIAGNOSIS — H25812 Combined forms of age-related cataract, left eye: Secondary | ICD-10-CM | POA: Diagnosis not present

## 2022-02-14 DIAGNOSIS — H2512 Age-related nuclear cataract, left eye: Secondary | ICD-10-CM | POA: Diagnosis not present

## 2022-02-14 NOTE — Progress Notes (Signed)
Routing to Judson Roch for CT results  ? ?Sarah, pt asking for results in "layman's terms"  ?She sees the report in mychart  ?Please advise, thanks! ?

## 2022-02-21 NOTE — Telephone Encounter (Signed)
Called and spoke with pt and advised of lung screening CT results. Also let pt know that we had sent a result letter via [facility] on 02/07/2032. Pt verbalized understanding and had no further questions. CT results were faxed to PCP and order was placed for 12 mth f/u lung screening CT scan.  ?

## 2022-02-21 NOTE — Telephone Encounter (Signed)
Patient called back to check on this- patient would like a call back as soon as possible as it has been over a week. ? ? ?

## 2022-03-28 DIAGNOSIS — H25811 Combined forms of age-related cataract, right eye: Secondary | ICD-10-CM | POA: Diagnosis not present

## 2022-03-28 DIAGNOSIS — H25011 Cortical age-related cataract, right eye: Secondary | ICD-10-CM | POA: Diagnosis not present

## 2022-03-28 DIAGNOSIS — H2511 Age-related nuclear cataract, right eye: Secondary | ICD-10-CM | POA: Diagnosis not present

## 2022-03-28 DIAGNOSIS — H269 Unspecified cataract: Secondary | ICD-10-CM | POA: Diagnosis not present

## 2022-04-04 ENCOUNTER — Encounter (HOSPITAL_BASED_OUTPATIENT_CLINIC_OR_DEPARTMENT_OTHER): Payer: Self-pay

## 2022-04-04 ENCOUNTER — Ambulatory Visit (HOSPITAL_BASED_OUTPATIENT_CLINIC_OR_DEPARTMENT_OTHER)
Admission: RE | Admit: 2022-04-04 | Discharge: 2022-04-04 | Disposition: A | Discharge status: Home / Self Care | Payer: Medicare HMO | Source: Ambulatory Visit | Attending: Family Medicine | Admitting: Family Medicine

## 2022-04-04 DIAGNOSIS — M85851 Other specified disorders of bone density and structure, right thigh: Secondary | ICD-10-CM | POA: Diagnosis not present

## 2022-04-04 DIAGNOSIS — M8580 Other specified disorders of bone density and structure, unspecified site: Secondary | ICD-10-CM | POA: Diagnosis not present

## 2022-04-04 DIAGNOSIS — Z1231 Encounter for screening mammogram for malignant neoplasm of breast: Secondary | ICD-10-CM | POA: Diagnosis not present

## 2022-04-04 DIAGNOSIS — E2839 Other primary ovarian failure: Secondary | ICD-10-CM

## 2022-04-04 DIAGNOSIS — Z780 Asymptomatic menopausal state: Secondary | ICD-10-CM

## 2022-04-04 DIAGNOSIS — M85852 Other specified disorders of bone density and structure, left thigh: Secondary | ICD-10-CM | POA: Diagnosis not present

## 2022-04-08 ENCOUNTER — Other Ambulatory Visit: Payer: Self-pay | Admitting: Internal Medicine

## 2022-04-13 DIAGNOSIS — G4733 Obstructive sleep apnea (adult) (pediatric): Secondary | ICD-10-CM | POA: Diagnosis not present

## 2022-04-26 ENCOUNTER — Telehealth: Payer: Self-pay | Admitting: Adult Health

## 2022-04-26 NOTE — Telephone Encounter (Signed)
I called the patient and the number listed is not correct.

## 2022-05-02 DIAGNOSIS — G4733 Obstructive sleep apnea (adult) (pediatric): Secondary | ICD-10-CM | POA: Diagnosis not present

## 2022-05-19 ENCOUNTER — Other Ambulatory Visit: Payer: Self-pay | Admitting: Family Medicine

## 2022-05-24 ENCOUNTER — Encounter (INDEPENDENT_AMBULATORY_CARE_PROVIDER_SITE_OTHER): Payer: Self-pay

## 2022-07-14 ENCOUNTER — Other Ambulatory Visit: Payer: Self-pay | Admitting: Internal Medicine

## 2022-07-14 ENCOUNTER — Encounter: Payer: Self-pay | Admitting: Family Medicine

## 2022-07-14 ENCOUNTER — Ambulatory Visit (INDEPENDENT_AMBULATORY_CARE_PROVIDER_SITE_OTHER): Payer: Medicare HMO

## 2022-07-14 DIAGNOSIS — Z23 Encounter for immunization: Secondary | ICD-10-CM

## 2022-08-03 DIAGNOSIS — G4733 Obstructive sleep apnea (adult) (pediatric): Secondary | ICD-10-CM | POA: Diagnosis not present

## 2022-08-23 ENCOUNTER — Other Ambulatory Visit: Payer: Self-pay | Admitting: Family Medicine

## 2022-08-30 DIAGNOSIS — R52 Pain, unspecified: Secondary | ICD-10-CM | POA: Diagnosis not present

## 2022-08-30 DIAGNOSIS — M13841 Other specified arthritis, right hand: Secondary | ICD-10-CM | POA: Diagnosis not present

## 2022-09-06 ENCOUNTER — Ambulatory Visit: Payer: Medicare HMO | Admitting: Orthopedic Surgery

## 2022-09-12 ENCOUNTER — Encounter: Payer: Medicare HMO | Admitting: Family

## 2022-09-13 ENCOUNTER — Ambulatory Visit: Payer: Medicare HMO | Admitting: Orthopedic Surgery

## 2022-10-03 ENCOUNTER — Ambulatory Visit (INDEPENDENT_AMBULATORY_CARE_PROVIDER_SITE_OTHER): Payer: Medicare HMO | Admitting: Family

## 2022-10-03 ENCOUNTER — Encounter: Payer: Self-pay | Admitting: Family

## 2022-10-03 VITALS — BP 165/100 | HR 56 | Temp 97.5°F | Resp 16 | Ht 61.5 in | Wt 178.0 lb

## 2022-10-03 DIAGNOSIS — E782 Mixed hyperlipidemia: Secondary | ICD-10-CM

## 2022-10-03 DIAGNOSIS — I10 Essential (primary) hypertension: Secondary | ICD-10-CM | POA: Diagnosis not present

## 2022-10-03 DIAGNOSIS — Z0000 Encounter for general adult medical examination without abnormal findings: Secondary | ICD-10-CM | POA: Diagnosis not present

## 2022-10-03 DIAGNOSIS — E559 Vitamin D deficiency, unspecified: Secondary | ICD-10-CM

## 2022-10-03 LAB — COMPREHENSIVE METABOLIC PANEL
ALT: 11 U/L (ref 0–35)
AST: 17 U/L (ref 0–37)
Albumin: 4 g/dL (ref 3.5–5.2)
Alkaline Phosphatase: 52 U/L (ref 39–117)
BUN: 19 mg/dL (ref 6–23)
CO2: 29 mEq/L (ref 19–32)
Calcium: 9.4 mg/dL (ref 8.4–10.5)
Chloride: 96 mEq/L (ref 96–112)
Creatinine, Ser: 0.86 mg/dL (ref 0.40–1.20)
GFR: 65.01 mL/min (ref >60.00)
Glucose, Bld: 90 mg/dL (ref 70–99)
Potassium: 4.4 mEq/L (ref 3.5–5.1)
Sodium: 133 mEq/L — ABNORMAL LOW (ref 135–145)
Total Bilirubin: 0.6 mg/dL (ref 0.2–1.2)
Total Protein: 6.3 g/dL (ref 6.0–8.3)

## 2022-10-03 LAB — LIPID PANEL
Cholesterol: 195 mg/dL (ref 0–200)
HDL: 62.4 mg/dL (ref >39.00)
LDL Cholesterol: 116 mg/dL — ABNORMAL HIGH (ref 0–99)
NonHDL: 132.66
Total CHOL/HDL Ratio: 3
Triglycerides: 85 mg/dL (ref 0.0–149.0)
VLDL: 17 mg/dL (ref 0.0–40.0)

## 2022-10-03 LAB — VITAMIN D 25 HYDROXY (VIT D DEFICIENCY, FRACTURES): VITD: 72.96 ng/mL (ref 30.00–100.00)

## 2022-10-03 MED ORDER — AMLODIPINE BESYLATE 5 MG PO TABS: 5.0000 mg | ORAL_TABLET | Freq: Every day | ORAL | 3 refills | Status: DC

## 2022-10-03 NOTE — Progress Notes (Signed)
Subjective:   By signing my name below, I, Mandy [name], attest that this documentation has been prepared under the direction and in the presence of Karie Chimera, NP 10/03/2022    Patient ID: Mandy [name], female    DOB: [birth year], 76 y.o.   MRN: [id number]  Chief Complaint  Patient presents with   Annual Exam    HPI Patient is in today for a comprehensive physical exam  Cough: She reports of a persistent cough that appeared years ago. She has been seen for her symptoms but was told of no significant concerns. She believes that symptoms could be due to her blood pressure medication. She denies of any reflux but does note of occasional post-nasal drip. She uses a Cpap at home and states that she wakes up with some nose congestion.   Joint Pain: She reports of left wrist pain and occasional right leg pain from when she fell. 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   Blood pressure: As of today's visit, her blood pressure is elevated. She is regularly taking 37.5-25 mg of Dyazide. Her blood pressure upon recheck is 165/100 mmHg. BP Readings from Last 3 Encounters:  10/03/22 (!) 165/100  12/22/21 140/90  12/08/21 128/70   Pulse Readings from Last 3 Encounters:  10/03/22 (!) 56  12/22/21 (!) 57  12/08/21 65   Cholesterol: She is currently taking 10 mg of Zetia.  Lab Results  Component Value Date   CHOL 188 06/08/2021   HDL 69 06/08/2021   LDLCALC 104 (H) 06/08/2021   LDLDIRECT 113.4 11/12/2012   TRIG 85 06/08/2021   CHOLHDL 2.7 06/08/2021   She denies having any fever, hearing or vision symptoms, new muscle pain, joint pain , new moles, rashes, congestion, sinus pain, sore throat, palpations, SOB ,wheezing,n/v/d constipation, blood in stool, dysuria, frequency, hematuria, depression, anxiety, headaches at this time  Social history: She denies of any changes to her family medical history. She states that she is  semi retired.  Dexa: Last completed on 04/04/2022. She states that the technician informed her that she has scoliosis. She states that her back is "crunchy" and is wondering if the reason for her back symptoms is due to her scoliosis.  Mammogram: Last completed on 04/04/2022 Lung Screening: Last completed on 02/06/2022 Immunizations: She is UTD on the shingles, Influenza, and Pneumonia vaccines.  Diet: She states that she is maintaining a healthy diet. However, she does note that she gained about 20 lbs during the [facility].  Exercise: She is walking daily and doing weight-baring exercises 2-3 times a week.  Dental: She is UTD on dental exams, she reports that her next dental screening is 10/04/2022.  Vision: She is UTD on vision exams, she reports of two cataract surgeries this year.    Health Maintenance Due  Topic Date Due   Medicare Annual Wellness (AWV)  02/06/2019    Past Medical History:  Diagnosis Date   Atrophic vaginitis 11/18/2012   Back pain    Chicken pox as a child   Cough 07/14/2016   Dermatitis 04/26/2011   GERD (gastroesophageal reflux disease) 03/20/2017   High cholesterol    HTN (hypertension) 04/13/2010   Qualifier: Diagnosis of  By: Charlett Blake MD, [place]     Hypertension    Measles as a child   Miscarriage 1981   Onychomycosis 04/26/2011   OSA (obstructive sleep apnea) 10/30/2017   Osteopenia 10/17/1999  Osteopenia 04/13/2010   Qualifier: Diagnosis of  By: Charlett Blake MD, [place]     Preventative health care 07/14/2016   Screening for malignant neoplasm of the cervix 04/26/2011   Sleep apnea    Tinea pedis 05/10/2014   Urinary frequency 03/20/2017    Past Surgical History:  Procedure Laterality Date   cataracts Bilateral 2023   DILATION AND CURETTAGE OF UTERUS  10/17/1979   miscarriage    Family History  Problem Relation Age of Onset   Osteoporosis Mother    Coronary artery disease Mother    Heart attack Mother 50   Hypertension Mother    Dementia Mother     Hyperlipidemia Mother    Stroke Mother    Obesity Mother    Diabetes Father        type 2   Heart disease Father    Stroke Father    Obesity Father    Dementia Brother        Lewy Body   Parkinson's disease Brother    Dementia Maternal Grandmother    Asthma Maternal Grandfather    Cancer Paternal Grandmother        ovarian   Dementia Paternal Grandmother    Diabetes Paternal Grandfather        tyoe 2   Hearing loss Paternal Grandfather    Birth defects Maternal Aunt    Arthritis Other    Hyperlipidemia Other    Hypertension Other    Other Other        Cardiovascular disorder    Social History   Socioeconomic History   Marital status: Married    Spouse name: Gerald Stabs   Number of children: Not on file   Years of education: Not on file   Highest education level: Not on file  Occupational History   Occupation: bookkeeper  Tobacco Use   Smoking status: Former    Packs/day: 1.00    Years: 45.00    Total pack years: 45.00    Types: Cigarettes    Quit date: 2013    Years since quitting: 10.9   Smokeless tobacco: Never  Vaping Use   Vaping Use: Never used  Substance and Sexual Activity   Alcohol use: No   Drug use: No   Sexual activity: Yes    Partners: Male  Other Topics Concern   Not on file  Social History Narrative   Regular exercise- yes   Does Yoga 3-4 X week, personal trainer 1 X week   and walks regularly   Has lost 16 pounds by minimizing processed foods and meats   Social Determinants of Health   Financial Resource Strain: Not on file  Food Insecurity: Not on file  Transportation Needs: Not on file  Physical Activity: Not on file  Stress: Not on file  Social Connections: Not on file  Intimate Partner Violence: Not on file    Outpatient Medications Prior to Visit  Medication Sig Dispense Refill   ammonium lactate (LAC-HYDRIN) 12 % cream Apply 1 application on to the skin as needed for dry skin. 385 g 0   aspirin 81 MG tablet Take 81 mg by mouth  daily.     azelastine (ASTELIN) 0.1 % nasal spray Place 1 spray into both nostrils daily as needed for rhinitis. Use in each nostril as directed 30 mL 5   b complex vitamins tablet Take 1 tablet by mouth daily.     calcium citrate-vitamin D (CITRACAL+D) 315-200 MG-UNIT per tablet Take 1 tablet by mouth  2 (two) times daily.     ezetimibe (ZETIA) 10 MG tablet Take 1 tablet (10 mg total) by mouth daily. 90 tablet 3   Methylsulfonylmethane (MSM PO) Take 2 capsules by mouth daily. 1,000 mg 3 x daily     multivitamin (THERAGRAN) per tablet Take 1 tablet by mouth daily.     mupirocin ointment (BACTROBAN) 2 % APPLY INTO THE NOSE AT BEDTIME AS NEEDED. 22 g 1   Nutritional Supplements (VITAMIN D BOOSTER PO) Take by mouth.     Omega-3 Fatty Acids-Vitamin E (COROMEGA PO) Take by mouth.     PREMARIN vaginal cream APPLY A SMALL AMOUNT (APPROXIMATELY 0.25G)PER VAGINA 3 TIMES A WEEK AT BEDTIME. 30 g 0   Probiotic Product (PROBIOTIC DAILY PO) Take by mouth daily.     Red Yeast Rice Extract (RED YEAST RICE PO) Take by mouth.     triamterene-hydrochlorothiazide (DYAZIDE) 37.5-25 MG capsule TAKE ONE CAPSULE DAILY 90 capsule 0   TURMERIC PO Take 1 capsule by mouth daily.      vitamin C (ASCORBIC ACID) 500 MG tablet Take 500 mg by mouth daily.     Zinc Acetate, Oral, (ZINC ACETATE PO) Take by mouth.     No facility-administered medications prior to visit.    Allergies  Allergen Reactions   Codeine    Pneumococcal Vaccine Polyvalent     REACTION: rash at injection site and induration.   Red Yeast Rice Extract     Leg pain    Review of Systems  Constitutional:  Negative for fever.  HENT:  Negative for congestion, sinus pain and sore throat.   Respiratory:  Positive for cough. Negative for shortness of breath and wheezing.   Cardiovascular:  Negative for palpitations.  Gastrointestinal:  Negative for blood in stool, constipation, diarrhea, nausea and vomiting.  Genitourinary:  Negative for dysuria,  frequency and hematuria.  Musculoskeletal:  Positive for joint pain (Left Wrist) and myalgias (Right Leg).  Skin:  Negative for rash.       (-) New Moles  Neurological:  Negative for headaches.  Psychiatric/Behavioral:  Negative for depression. The patient is not nervous/anxious.        Objective:    Physical Exam Constitutional:      General: She is not in acute distress.    Appearance: Normal appearance. She is not ill-appearing.  HENT:     Head: Normocephalic and atraumatic.     Right Ear: Tympanic membrane, ear canal and external ear normal.     Left Ear: Tympanic membrane, ear canal and external ear normal. There is impacted cerumen.     Mouth/Throat:     Pharynx: No oropharyngeal exudate.  Eyes:     Extraocular Movements: Extraocular movements intact.     Pupils: Pupils are equal, round, and reactive to light.  Cardiovascular:     Rate and Rhythm: Normal rate and regular rhythm.     Heart sounds: Normal heart sounds. No murmur heard.    No gallop.  Pulmonary:     Effort: Pulmonary effort is normal. No respiratory distress.     Breath sounds: Normal breath sounds. No wheezing or rales.  Abdominal:     General: Bowel sounds are normal. There is no distension.     Palpations: Abdomen is soft.     Tenderness: There is no abdominal tenderness. There is no guarding.  Musculoskeletal:     Comments: 5/5 strength in both upper and lower extremities    Skin:    General: Skin  is warm and dry.  Neurological:     Mental Status: She is alert and oriented to person, place, and time.     Deep Tendon Reflexes:     Reflex Scores:      Patellar reflexes are 2+ on the right side and 2+ on the left side. Psychiatric:        Mood and Affect: Mood normal.        Behavior: Behavior normal.        Judgment: Judgment normal.     BP (!) 165/100 (BP Location: Right Arm, Patient Position: Sitting, Cuff Size: Normal)   Pulse (!) 56   Temp (!) 97.5 F (36.4 C) (Oral)   Resp 16   Ht 5'  1.5" (1.562 m)   Wt 178 lb (80.7 kg)   SpO2 96%   BMI 33.09 kg/m  Wt Readings from Last 3 Encounters:  10/03/22 178 lb (80.7 kg)  12/22/21 178 lb 6.4 oz (80.9 kg)  12/08/21 186 lb 9.6 oz (84.6 kg)       Assessment & Plan:   Problem List Items Addressed This Visit       Unprioritized   Vitamin D deficiency   Relevant Orders   Vitamin D (25 hydroxy)   Preventative health care - Primary    Continue healthy diet and regular exercise.  Mammo up to date, lung cancer screening CT is also up to date.       Mixed hyperlipidemia    Lab Results  Component Value Date   CHOL 188 06/08/2021   HDL 69 06/08/2021   LDLCALC 104 (H) 06/08/2021   LDLDIRECT 113.4 11/12/2012   TRIG 85 06/08/2021   CHOLHDL 2.7 06/08/2021  Maintained on zetia.        Relevant Medications   amLODipine (NORVASC) 5 MG tablet   Other Relevant Orders   Lipid panel   Essential hypertension    BP Readings from Last 3 Encounters:  10/03/22 (!) 165/100  12/22/21 140/90  12/08/21 128/70  She is maintained on dyazide 37.5-25mg.  BP is uncontrolled. Recommended addition of amlodipine 61m once daily.       Relevant Medications   amLODipine (NORVASC) 5 MG tablet   Other Relevant Orders   Comp Met (CMET)   Meds ordered this encounter  Medications   amLODipine (NORVASC) 5 MG tablet    Sig: Take 1 tablet (5 mg total) by mouth daily.    Dispense:  30 tablet    Refill:  3    Order Specific Question:   Supervising Provider    Answer:   BPenni HomansA [[id number]]    I, MNance Pear NP, personally preformed the services described in this documentation.  All medical record entries made by the scribe were at my direction and in my presence.  I have reviewed the chart and discharge instructions (if applicable) and agree that the record reflects my personal performance and is accurate and complete. 10/03/2022   I,Amber Collins,acting as a scribe for MNance Pear NP.,have documented all relevant  documentation on the behalf of MNance Pear NP,as directed by  MNance Pear NP while in the presence of MNance Pear NP.    MNance Pear NP

## 2022-10-03 NOTE — Assessment & Plan Note (Addendum)
BP Readings from Last 3 Encounters:  10/03/22 (!) 165/100  12/22/21 140/90  12/08/21 128/70   She is maintained on dyazide 37.5-'25mg'$ .  BP is uncontrolled. Recommended addition of amlodipine '5mg'$  once daily.

## 2022-10-03 NOTE — Assessment & Plan Note (Signed)
Continue healthy diet and regular exercise.  Mammo up to date, lung cancer screening CT is also up to date.

## 2022-10-03 NOTE — Assessment & Plan Note (Signed)
Lab Results  Component Value Date   CHOL 188 06/08/2021   HDL 69 06/08/2021   LDLCALC 104 (H) 06/08/2021   LDLDIRECT 113.4 11/12/2012   TRIG 85 06/08/2021   CHOLHDL 2.7 06/08/2021   Maintained on zetia.

## 2022-10-23 DIAGNOSIS — R52 Pain, unspecified: Secondary | ICD-10-CM | POA: Diagnosis not present

## 2022-10-23 DIAGNOSIS — M13841 Other specified arthritis, right hand: Secondary | ICD-10-CM | POA: Diagnosis not present

## 2022-10-23 DIAGNOSIS — M25531 Pain in right wrist: Secondary | ICD-10-CM | POA: Diagnosis not present

## 2022-10-23 DIAGNOSIS — M79644 Pain in right finger(s): Secondary | ICD-10-CM | POA: Diagnosis not present

## 2022-10-23 DIAGNOSIS — M24139 Other articular cartilage disorders, unspecified wrist: Secondary | ICD-10-CM | POA: Diagnosis not present

## 2022-10-24 ENCOUNTER — Ambulatory Visit: Payer: Medicare HMO | Admitting: Family

## 2022-10-27 ENCOUNTER — Ambulatory Visit (INDEPENDENT_AMBULATORY_CARE_PROVIDER_SITE_OTHER): Payer: Medicare HMO | Admitting: Family

## 2022-10-27 VITALS — BP 121/67 | HR 60 | Resp 18 | Ht 61.5 in | Wt 180.0 lb

## 2022-10-27 DIAGNOSIS — I10 Essential (primary) hypertension: Secondary | ICD-10-CM | POA: Diagnosis not present

## 2022-10-27 DIAGNOSIS — H6123 Impacted cerumen, bilateral: Secondary | ICD-10-CM | POA: Diagnosis not present

## 2022-10-27 NOTE — Assessment & Plan Note (Signed)
Improved. Recommended that she flush with bulb syringe at home.

## 2022-10-27 NOTE — Progress Notes (Signed)
Subjective:     Patient ID: Mandy [name], female    DOB: October 01, 1946, 77 y.o.   MRN: [id number]  Chief Complaint  Patient presents with   Follow-up    3 week     HPI Patient is in today for follow up of her blood pressure.  Last visit we added amlodipine '5mg'$  once daily.  BP this AM at home was 121/67.   BP Readings from Last 3 Encounters:  10/27/22 121/67  10/03/22 (!) 165/100  12/22/21 140/90   She has been using ear wax drops since last visit.   Health Maintenance Due  Topic Date Due   Medicare Annual Wellness (AWV)  02/06/2019    Past Medical History:  Diagnosis Date   Atrophic vaginitis 11/18/2012   Back pain    Chicken pox as a child   Cough 07/14/2016   Dermatitis 04/26/2011   GERD (gastroesophageal reflux disease) 03/20/2017   High cholesterol    HTN (hypertension) 04/13/2010   Qualifier: Diagnosis of  By: Charlett Blake MD, [place]     Hypertension    Measles as a child   Miscarriage 1981   Onychomycosis 04/26/2011   OSA (obstructive sleep apnea) 10/30/2017   Osteopenia 10/17/1999   Osteopenia 04/13/2010   Qualifier: Diagnosis of  By: Charlett Blake MD, [place]     Preventative health care 07/14/2016   Screening for malignant neoplasm of the cervix 04/26/2011   Sleep apnea    Tinea pedis 05/10/2014   Urinary frequency 03/20/2017    Past Surgical History:  Procedure Laterality Date   cataracts Bilateral 2023   DILATION AND CURETTAGE OF UTERUS  10/17/1979   miscarriage    Family History  Problem Relation Age of Onset   Osteoporosis Mother    Coronary artery disease Mother    Heart attack Mother 24   Hypertension Mother    Dementia Mother    Hyperlipidemia Mother    Stroke Mother    Obesity Mother    Diabetes Father        type 2   Heart disease Father    Stroke Father    Obesity Father    Dementia Brother        Lewy Body   Parkinson's disease Brother    Dementia Maternal Grandmother    Asthma Maternal Grandfather    Cancer Paternal Grandmother        ovarian    Dementia Paternal Grandmother    Diabetes Paternal Grandfather        tyoe 2   Hearing loss Paternal Grandfather    Birth defects Maternal Aunt    Arthritis Other    Hyperlipidemia Other    Hypertension Other    Other Other        Cardiovascular disorder    Social History   Socioeconomic History   Marital status: Married    Spouse name: Gerald Stabs   Number of children: Not on file   Years of education: Not on file   Highest education level: Not on file  Occupational History   Occupation: bookkeeper  Tobacco Use   Smoking status: Former    Packs/day: 1.00    Years: 45.00    Total pack years: 45.00    Types: Cigarettes    Quit date: 2013    Years since quitting: 11.0   Smokeless tobacco: Never  Vaping Use   Vaping Use: Never used  Substance and Sexual Activity   Alcohol use: No   Drug use: No   Sexual  activity: Yes    Partners: Male  Other Topics Concern   Not on file  Social History Narrative   Regular exercise- yes   Does Yoga 3-4 X week, personal trainer 1 X week   and walks regularly   Has lost 16 pounds by minimizing processed foods and meats   Social Determinants of Health   Financial Resource Strain: Not on file  Food Insecurity: Not on file  Transportation Needs: Not on file  Physical Activity: Not on file  Stress: Not on file  Social Connections: Not on file  Intimate Partner Violence: Not on file    Outpatient Medications Prior to Visit  Medication Sig Dispense Refill   amLODipine (NORVASC) 5 MG tablet Take 1 tablet (5 mg total) by mouth daily. 30 tablet 3   ammonium lactate (LAC-HYDRIN) 12 % cream Apply 1 application on to the skin as needed for dry skin. 385 g 0   aspirin 81 MG tablet Take 81 mg by mouth daily.     azelastine (ASTELIN) 0.1 % nasal spray Place 1 spray into both nostrils daily as needed for rhinitis. Use in each nostril as directed 30 mL 5   b complex vitamins tablet Take 1 tablet by mouth daily.     calcium citrate-vitamin D  (CITRACAL+D) 315-200 MG-UNIT per tablet Take 1 tablet by mouth 2 (two) times daily.     ezetimibe (ZETIA) 10 MG tablet Take 1 tablet (10 mg total) by mouth daily. 90 tablet 3   Methylsulfonylmethane (MSM PO) Take 2 capsules by mouth daily. 1,000 mg 3 x daily     multivitamin (THERAGRAN) per tablet Take 1 tablet by mouth daily.     mupirocin ointment (BACTROBAN) 2 % APPLY INTO THE NOSE AT BEDTIME AS NEEDED. 22 g 1   Nutritional Supplements (VITAMIN D BOOSTER PO) Take by mouth.     Omega-3 Fatty Acids-Vitamin E (COROMEGA PO) Take by mouth.     PREMARIN vaginal cream APPLY A SMALL AMOUNT (APPROXIMATELY 0.25G)PER VAGINA 3 TIMES A WEEK AT BEDTIME. 30 g 0   Probiotic Product (PROBIOTIC DAILY PO) Take by mouth daily.     Red Yeast Rice Extract (RED YEAST RICE PO) Take by mouth.     triamterene-hydrochlorothiazide (DYAZIDE) 37.5-25 MG capsule TAKE ONE CAPSULE DAILY 90 capsule 0   TURMERIC PO Take 1 capsule by mouth daily.      vitamin C (ASCORBIC ACID) 500 MG tablet Take 500 mg by mouth daily.     Zinc Acetate, Oral, (ZINC ACETATE PO) Take by mouth.     No facility-administered medications prior to visit.    Allergies  Allergen Reactions   Codeine    Pneumococcal Vaccine Polyvalent     REACTION: rash at injection site and induration.   Red Yeast Rice Extract     Leg pain    ROS     Objective:    Physical Exam Constitutional:      General: She is not in acute distress.    Appearance: Normal appearance. She is well-developed.  HENT:     Head: Normocephalic and atraumatic.     Right Ear: External ear normal.     Left Ear: External ear normal.     Ears:     Comments: Some cerumen noted in both ear canals. Eyes:     General: No scleral icterus. Neck:     Thyroid: No thyromegaly.  Cardiovascular:     Rate and Rhythm: Normal rate and regular rhythm.  Heart sounds: Normal heart sounds. No murmur heard. Pulmonary:     Effort: Pulmonary effort is normal. No respiratory distress.      Breath sounds: Normal breath sounds. No wheezing.  Musculoskeletal:     Cervical back: Neck supple.  Skin:    General: Skin is warm and dry.  Neurological:     Mental Status: She is alert and oriented to person, place, and time.  Psychiatric:        Mood and Affect: Mood normal.        Behavior: Behavior normal.        Thought Content: Thought content normal.        Judgment: Judgment normal.     BP 121/67   Pulse 60   Resp 18   Ht 5' 1.5" (1.562 m)   Wt 180 lb (81.6 kg)   SpO2 100%   BMI 33.46 kg/m  Wt Readings from Last 3 Encounters:  10/27/22 180 lb (81.6 kg)  10/03/22 178 lb (80.7 kg)  12/22/21 178 lb 6.4 oz (80.9 kg)       Assessment & Plan:   Problem List Items Addressed This Visit       Unprioritized   Excessive cerumen in both ear canals    Improved. Recommended that she flush with bulb syringe at home.       Essential hypertension - Primary    Bp is improve with amlodipine.  Continue same. She will continue to monitor at home. Follow up in 6 months.        I am having Mandy [name] maintain her multivitamin, calcium citrate-vitamin D, ascorbic acid, b complex vitamins, aspirin, Omega-3 Fatty Acids-Vitamin E (COROMEGA PO), Probiotic Product (PROBIOTIC DAILY PO), TURMERIC PO, Methylsulfonylmethane (MSM PO), (Zinc Acetate, Oral, (ZINC ACETATE PO)), Premarin, Red Yeast Rice Extract (RED YEAST RICE PO), Nutritional Supplements (VITAMIN D BOOSTER PO), azelastine, mupirocin ointment, ammonium lactate, ezetimibe, triamterene-hydrochlorothiazide, and amLODipine.  No orders of the defined types were placed in this encounter.

## 2022-10-27 NOTE — Assessment & Plan Note (Signed)
Bp is improve with amlodipine.  Continue same. She will continue to monitor at home. Follow up in 6 months.

## 2022-10-28 ENCOUNTER — Other Ambulatory Visit: Payer: Self-pay | Admitting: Family Medicine

## 2022-11-04 DIAGNOSIS — G4733 Obstructive sleep apnea (adult) (pediatric): Secondary | ICD-10-CM | POA: Diagnosis not present

## 2022-11-15 ENCOUNTER — Other Ambulatory Visit: Payer: Self-pay | Admitting: Family Medicine

## 2022-11-17 ENCOUNTER — Ambulatory Visit (INDEPENDENT_AMBULATORY_CARE_PROVIDER_SITE_OTHER): Payer: Medicare HMO

## 2022-11-17 ENCOUNTER — Encounter: Payer: Self-pay | Admitting: Orthopedic Surgery

## 2022-11-17 ENCOUNTER — Ambulatory Visit: Payer: Medicare HMO | Admitting: Surgical

## 2022-11-17 ENCOUNTER — Ambulatory Visit: Payer: Self-pay

## 2022-11-17 DIAGNOSIS — M5410 Radiculopathy, site unspecified: Secondary | ICD-10-CM | POA: Diagnosis not present

## 2022-11-17 DIAGNOSIS — M79605 Pain in left leg: Secondary | ICD-10-CM

## 2022-11-17 MED ORDER — CELECOXIB 100 MG PO CAPS: 100.0000 mg | ORAL_CAPSULE | Freq: Two times a day (BID) | ORAL | 0 refills | Status: DC

## 2022-11-17 MED ORDER — GABAPENTIN 100 MG PO CAPS: 100.0000 mg | ORAL_CAPSULE | Freq: Three times a day (TID) | ORAL | 0 refills | Status: DC

## 2022-11-17 NOTE — Progress Notes (Signed)
Office Visit Note   Patient: Mandy [name]           Date of Birth: [birth year]           MRN: [id number] Visit Date: 11/17/2022 Requested by: Mosie Lukes, MD [address] [place],  [place] [address] PCP: Mosie Lukes, MD  Subjective: Chief Complaint  Patient presents with   Left Knee - Pain    HPI: Mandy [name] is a 77 y.o. female who presents to the office reporting left leg pain.  Patient states that she woke up to excruciating pain in the left and right buttock about 6 weeks ago.  This pain extended down the right and left legs to her toes.  She did some stretching and worked with her Product/process development scientist in [place] which helped resolve the right-sided pain but did not really help with her left leg radicular pain.  The right pain is now completely resolved but she has continued pain in the left leg.  She has tried yoga stretching, home exercise program, sitting on a heating pad, Tylenol, ibuprofen without relief.  She also has upcoming surgery with Dr. Amedeo Plenty and sent a message to him who ended up prescribing steroid Dosepak and muscle relaxer which has not really provided any lasting relief but did help temporarily for a couple days.    She notes pain that radiates from the left buttock down the lateral aspect of the thigh to the lateral aspect of the ankle.  She states this pain is exacerbated by standing.  She describes the pain as a stabbing pain.  She does not have any numbness and tingling, burning, weakness in the leg.  She does have a history of low back pain that she has had for years but there is no acute worsening of this low back pain.  Leg does not give out on her.  No groin pain.  She is able to sleep decently at night.  No history of prior back surgery..                ROS: All systems reviewed are negative as they relate to the chief complaint within the history of present illness.  Patient denies fevers or chills.  Assessment & Plan: Visit Diagnoses:  1.  Radicular syndrome of left leg   2. Pain in left leg     Plan: Patient is a 77 year old female who presents for evaluation of left leg pain.  She has continued radicular left leg pain despite an adequate trial of conservative management consisting of Tylenol, anti-inflammatories, activity modification, home exercise program, manipulation by athletic trainer, steroid Dosepak, muscle relaxer without any relief.  She has no weakness on exam today or any red flag symptoms such as bowel/bladder incontinence or saddle anesthesia.  She does have radiographs taken today demonstrating facet arthritis at multiple levels along with spondylolisthesis noted at L4-L5 and scoliosis of the lumbar spine.  With her chronic low back pain as well as his new radicular pain that is not responding to conservative treatment, plan for further evaluation with MRI of the lumbar spine.  She has had 6 weeks of symptoms despite continued treatment.  Follow-up after MRI to review results.  Follow-Up Instructions: No follow-ups on file.   Orders:  Orders Placed This Encounter  Procedures   XR Lumbar Spine 2-3 Views   MR Lumbar Spine w/o contrast   Meds ordered this encounter  Medications   celecoxib (CELEBREX) 100 MG  capsule    Sig: Take 1 capsule (100 mg total) by mouth 2 (two) times daily. Do not take with Ibuprofen.    Dispense:  60 capsule    Refill:  0   gabapentin (NEURONTIN) 100 MG capsule    Sig: Take 1 capsule (100 mg total) by mouth 3 (three) times daily.    Dispense:  60 capsule    Refill:  0      Procedures: No procedures performed   Clinical Data: No additional findings.  Objective: Vital Signs: There were no vitals taken for this visit.  Physical Exam:  Constitutional: Patient appears well-developed HEENT:  Head: Normocephalic Eyes:EOM are normal Neck: Normal range of motion Cardiovascular: Normal rate Pulmonary/chest: Effort normal Neurologic: Patient is alert Skin: Skin is  warm Psychiatric: Patient has normal mood and affect  Ortho Exam: Ortho exam demonstrates 5/5 motor strength of bilateral hip flexion, quadricep, hamstring, dorsiflexion, plantarflexion.  She has no clonus on either side.  2+ patellar tendon reflexes bilaterally.  No pain with hip range of motion bilaterally.  Negative Stinchfield sign bilaterally.  She has mild to moderate tenderness over bilateral SI joints but slightly worse over the left.  She also has some tenderness over the axial lumbar spine primarily around the level of L4-L5.  She ambulates without any Trendelenburg gait.  Negative straight leg raise bilaterally.  Specialty Comments:  No specialty comments available.  Imaging: No results found.   PMFS History: Patient Active Problem List   Diagnosis Date Noted   Excessive cerumen in both ear canals 10/27/2022   Dry skin dermatitis 12/22/2021   Acute pain of right knee 12/22/2021   History of tobacco abuse 12/08/2021   Chronic rhinitis 12/03/2020   Hyperglycemia 06/28/2020   Class 1 obesity with serious comorbidity and body mass index (BMI) of 32.0 to 32.9 in adult 06/28/2020   Other fatigue 06/17/2020   Essential hypertension 06/17/2020   Other hyperlipidemia 06/17/2020   Low back pain 03/30/2019   OSA (obstructive sleep apnea) 10/30/2017   Frequent nocturnal awakening 07/27/2017   GERD (gastroesophageal reflux disease) 03/20/2017   Urinary frequency 03/20/2017   Preventative health care 07/14/2016   Cough 07/14/2016   Medicare annual wellness visit, subsequent 05/10/2014   Tinea pedis 05/10/2014   Atrophic vaginitis 11/18/2012   Screening for malignant neoplasm of cervix 04/26/2011   Onychomycosis 04/26/2011   Mixed hyperlipidemia 05/11/2010   Vitamin D deficiency 04/13/2010   CARPAL TUNNEL SYNDROME, RIGHT, MILD 04/13/2010   TRIGGER FINGER, RIGHT THUMB 04/13/2010   Osteopenia 04/13/2010   MUMPS, HX OF 04/13/2010   CHICKENPOX, HX OF 04/13/2010   Past Medical  History:  Diagnosis Date   Atrophic vaginitis 11/18/2012   Back pain    Chicken pox as a child   Cough 07/14/2016   Dermatitis 04/26/2011   GERD (gastroesophageal reflux disease) 03/20/2017   High cholesterol    HTN (hypertension) 04/13/2010   Qualifier: Diagnosis of  By: Charlett Blake MD, Stacey     Hypertension    Measles as a child   Miscarriage 1981   Onychomycosis 04/26/2011   OSA (obstructive sleep apnea) 10/30/2017   Osteopenia 10/17/1999   Osteopenia 04/13/2010   Qualifier: Diagnosis of  By: Charlett Blake MD, Stacey     Preventative health care 07/14/2016   Screening for malignant neoplasm of the cervix 04/26/2011   Sleep apnea    Tinea pedis 05/10/2014   Urinary frequency 03/20/2017    Family History  Problem Relation Age of Onset   Osteoporosis  Mother    Coronary artery disease Mother    Heart attack Mother 84   Hypertension Mother    Dementia Mother    Hyperlipidemia Mother    Stroke Mother    Obesity Mother    Diabetes Father        type 2   Heart disease Father    Stroke Father    Obesity Father    Dementia Brother        Lewy Body   Parkinson's disease Brother    Dementia Maternal Grandmother    Asthma Maternal Grandfather    Cancer Paternal Grandmother        ovarian   Dementia Paternal Grandmother    Diabetes Paternal Grandfather        tyoe 2   Hearing loss Paternal Grandfather    Birth defects Maternal Aunt    Arthritis Other    Hyperlipidemia Other    Hypertension Other    Other Other        Cardiovascular disorder    Past Surgical History:  Procedure Laterality Date   cataracts Bilateral 2023   DILATION AND CURETTAGE OF UTERUS  10/17/1979   miscarriage   Social History   Occupational History   Occupation: bookkeeper  Tobacco Use   Smoking status: Former    Packs/day: 1.00    Years: 45.00    Total pack years: 45.00    Types: Cigarettes    Quit date: 2013    Years since quitting: 11.0   Smokeless tobacco: Never  Vaping Use   Vaping Use: Never used   Substance and Sexual Activity   Alcohol use: No   Drug use: No   Sexual activity: Yes    Partners: Male

## 2022-11-20 ENCOUNTER — Telehealth: Payer: Self-pay | Admitting: Surgical

## 2022-11-20 NOTE — Telephone Encounter (Signed)
Two things for this pt She would like her MRI to be ordered at the [facility] in [place] it is close to her home and said she would even go on a Saturday if that was on option.  She is having hand surgery in the next few weeks and was wanting to sch this just ASAP (prior to her surgery) pt states that she already has the approval and that she just needs to be sch. I advised that I would mark the message with priority and send to scheduling.

## 2022-11-20 NOTE — Telephone Encounter (Signed)
Patient called in requesting Lauren F call her patient did not want to disclose why she needed a call she stated if you call her Home number and she does not answer to call the Mobile number on file.

## 2022-11-21 NOTE — Telephone Encounter (Signed)
I sent secure chat message to Mandy [name] at [facility] letting her know to contact pt to schedule appt.

## 2022-11-21 NOTE — Telephone Encounter (Signed)
I sent secure chat message to Ozella Almond at [facility] letting her know to contact pt to schedule appt.

## 2022-11-25 ENCOUNTER — Ambulatory Visit (HOSPITAL_BASED_OUTPATIENT_CLINIC_OR_DEPARTMENT_OTHER)
Admission: RE | Admit: 2022-11-25 | Discharge: 2022-11-25 | Disposition: A | Discharge status: Home / Self Care | Payer: Medicare HMO | Source: Ambulatory Visit | Attending: Orthopedic Surgery | Admitting: Orthopedic Surgery

## 2022-11-25 DIAGNOSIS — M545 Low back pain, unspecified: Secondary | ICD-10-CM | POA: Diagnosis not present

## 2022-11-25 DIAGNOSIS — M4126 Other idiopathic scoliosis, lumbar region: Secondary | ICD-10-CM | POA: Diagnosis not present

## 2022-11-25 DIAGNOSIS — M79605 Pain in left leg: Secondary | ICD-10-CM | POA: Insufficient documentation

## 2022-11-25 DIAGNOSIS — M47816 Spondylosis without myelopathy or radiculopathy, lumbar region: Secondary | ICD-10-CM | POA: Diagnosis not present

## 2022-11-25 DIAGNOSIS — M4316 Spondylolisthesis, lumbar region: Secondary | ICD-10-CM | POA: Diagnosis not present

## 2022-11-27 ENCOUNTER — Ambulatory Visit (HOSPITAL_BASED_OUTPATIENT_CLINIC_OR_DEPARTMENT_OTHER): Payer: Medicare HMO

## 2022-11-28 DIAGNOSIS — M24131 Other articular cartilage disorders, right wrist: Secondary | ICD-10-CM | POA: Diagnosis not present

## 2022-11-28 DIAGNOSIS — M1811 Unilateral primary osteoarthritis of first carpometacarpal joint, right hand: Secondary | ICD-10-CM | POA: Diagnosis not present

## 2022-11-28 DIAGNOSIS — S63591A Other specified sprain of right wrist, initial encounter: Secondary | ICD-10-CM | POA: Diagnosis not present

## 2022-11-28 DIAGNOSIS — M659 Synovitis and tenosynovitis, unspecified: Secondary | ICD-10-CM | POA: Diagnosis not present

## 2022-11-28 DIAGNOSIS — M65831 Other synovitis and tenosynovitis, right forearm: Secondary | ICD-10-CM | POA: Diagnosis not present

## 2022-12-01 ENCOUNTER — Ambulatory Visit: Payer: Medicare HMO | Admitting: Surgical

## 2022-12-01 DIAGNOSIS — M5410 Radiculopathy, site unspecified: Secondary | ICD-10-CM | POA: Diagnosis not present

## 2022-12-02 ENCOUNTER — Encounter: Payer: Self-pay | Admitting: Surgical

## 2022-12-02 NOTE — Progress Notes (Signed)
Office Visit Note   Patient: Mandy [name]           Date of Birth: [birth year]           MRN: [id number] Visit Date: 12/01/2022 Requested by: Mosie Lukes, MD [address] [place],  [place] [address] PCP: Mosie Lukes, MD  Subjective: Chief Complaint  Patient presents with   Lower Back - Follow-up    HPI: Mandy [name] is a 77 y.o. female who presents to the office for MRI review. Patient states that he left leg radicular pain is pretty much resolved at this point. She states that the celebrex and gabapentin provided excellent relief of her symptoms.  Currently has no complaints regarding her leg.  Just had right hand surgery by Dr Amedeo Plenty  MRI results revealed: MR Lumbar Spine w/o contrast  Result Date: 11/27/2022 CLINICAL DATA:  Low back pain for 6 weeks.  No known injury. EXAM: MRI LUMBAR SPINE WITHOUT CONTRAST TECHNIQUE: Multiplanar, multisequence MR imaging of the lumbar spine was performed. No intravenous contrast was administered. COMPARISON:  None Available. FINDINGS: Segmentation:  Standard. Alignment: Grade 1 anterolisthesis of L4 on L5. Dextroscoliosis of the lumbar spine. Vertebrae: No acute fracture, evidence of discitis, or aggressive bone lesion. Conus medullaris and cauda equina: Conus extends to the L1 level. Conus and cauda equina appear normal. Paraspinal and other soft tissues: No acute paraspinal abnormality. Disc levels: Disc spaces: Degenerative disease with disc height loss at L2-3. Disc desiccation at T12-L1, L1-2, L3-4 and L4-5. T12-L1: Mild broad-based disc bulge. Mild bilateral facet arthropathy. No foraminal or central canal stenosis. L1-L2: Mild broad-based disc bulge. Moderate bilateral facet arthropathy. Moderate left foraminal stenosis. Mild right foraminal stenosis. No spinal stenosis. L2-L3: Broad-based disc osteophyte complex. Moderate bilateral facet arthropathy. Mild spinal stenosis. Severe left foraminal stenosis. Mild right foraminal  stenosis. Bilateral subarticular recess stenosis. L3-L4: Broad-based disc bulge. Moderate bilateral facet arthropathy. Moderate spinal stenosis. Severe right foraminal stenosis. Mild left foraminal stenosis. L4-L5: Broad-based disc bulge. Severe right and moderate left facet arthropathy. Moderate spinal stenosis. No left foraminal stenosis. Mild right foraminal stenosis. Right subarticular recess stenosis. L5-S1: No significant disc bulge. No neural foraminal stenosis. No central canal stenosis. Severe bilateral facet arthropathy. IMPRESSION: 1. Lumbar spine spondylosis as described above. 2. No acute osseous injury of the lumbar spine. Electronically Signed   By: Kathreen Devoid M.D.   On: 11/27/2022 08:26                 ROS: All systems reviewed are negative as they relate to the chief complaint within the history of present illness.  Patient denies fevers or chills.  Assessment & Plan: Visit Diagnoses:  1. Radicular syndrome of left leg     Plan: Mandy [name] is a 77 y.o. female who presents to the office for eval of Left leg radicular pain and review of L spine MRI.  Symptoms have resolved with celebrex and gabapentin.  Mandy [name] does have a significant amount of spinal and foraminal stenosis at multiple levels but with lack of symptoms, plan to just monitor for now.  If her symptoms worsen after coming off her postop pain medication from surger with Dr Amedeo Plenty, we could consider L spine ESI.  No weakness on exam today.  She will call the office if her symptoms return but otherwise follow-up as needed.   Follow-Up Instructions: No follow-ups on file.   Orders:  No orders of the defined types  were placed in this encounter.  No orders of the defined types were placed in this encounter.     Procedures: No procedures performed   Clinical Data: No additional findings.  Objective: Vital Signs: There were no vitals taken for this visit.  Physical Exam:  Constitutional: Patient appears  well-developed HEENT:  Head: Normocephalic Eyes:EOM are normal Neck: Normal range of motion Cardiovascular: Normal rate Pulmonary/chest: Effort normal Neurologic: Patient is alert Skin: Skin is warm Psychiatric: Patient has normal mood and affect  Ortho Exam: patient ortho exam demonstrates 5/5 hip flexion, quad, hamstring, DF, PF strength. No clonus bilaterally. Negative straight leg raise bilaterally  Specialty Comments:  No specialty comments available.  Imaging: No results found.   PMFS History: Patient Active Problem List   Diagnosis Date Noted   Excessive cerumen in both ear canals 10/27/2022   Dry skin dermatitis 12/22/2021   Acute pain of right knee 12/22/2021   History of tobacco abuse 12/08/2021   Chronic rhinitis 12/03/2020   Hyperglycemia 06/28/2020   Class 1 obesity with serious comorbidity and body mass index (BMI) of 32.0 to 32.9 in adult 06/28/2020   Other fatigue 06/17/2020   Essential hypertension 06/17/2020   Other hyperlipidemia 06/17/2020   Low back pain 03/30/2019   OSA (obstructive sleep apnea) 10/30/2017   Frequent nocturnal awakening 07/27/2017   GERD (gastroesophageal reflux disease) 03/20/2017   Urinary frequency 03/20/2017   Preventative health care 07/14/2016   Cough 07/14/2016   Medicare annual wellness visit, subsequent 05/10/2014   Tinea pedis 05/10/2014   Atrophic vaginitis 11/18/2012   Screening for malignant neoplasm of cervix 04/26/2011   Onychomycosis 04/26/2011   Mixed hyperlipidemia 05/11/2010   Vitamin D deficiency 04/13/2010   CARPAL TUNNEL SYNDROME, RIGHT, MILD 04/13/2010   TRIGGER FINGER, RIGHT THUMB 04/13/2010   Osteopenia 04/13/2010   MUMPS, HX OF 04/13/2010   CHICKENPOX, HX OF 04/13/2010   Past Medical History:  Diagnosis Date   Atrophic vaginitis 11/18/2012   Back pain    Chicken pox as a child   Cough 07/14/2016   Dermatitis 04/26/2011   GERD (gastroesophageal reflux disease) 03/20/2017   High cholesterol    HTN  (hypertension) 04/13/2010   Qualifier: Diagnosis of  By: Charlett Blake MD, Stacey     Hypertension    Measles as a child   Miscarriage 1981   Onychomycosis 04/26/2011   OSA (obstructive sleep apnea) 10/30/2017   Osteopenia 10/17/1999   Osteopenia 04/13/2010   Qualifier: Diagnosis of  By: Charlett Blake MD, Stacey     Preventative health care 07/14/2016   Screening for malignant neoplasm of the cervix 04/26/2011   Sleep apnea    Tinea pedis 05/10/2014   Urinary frequency 03/20/2017    Family History  Problem Relation Age of Onset   Osteoporosis Mother    Coronary artery disease Mother    Heart attack Mother 31   Hypertension Mother    Dementia Mother    Hyperlipidemia Mother    Stroke Mother    Obesity Mother    Diabetes Father        type 2   Heart disease Father    Stroke Father    Obesity Father    Dementia Brother        Lewy Body   Parkinson's disease Brother    Dementia Maternal Grandmother    Asthma Maternal Grandfather    Cancer Paternal Grandmother        ovarian   Dementia Paternal Grandmother    Diabetes Paternal Grandfather  tyoe 2   Hearing loss Paternal Grandfather    Birth defects Maternal Aunt    Arthritis Other    Hyperlipidemia Other    Hypertension Other    Other Other        Cardiovascular disorder    Past Surgical History:  Procedure Laterality Date   cataracts Bilateral 2023   DILATION AND CURETTAGE OF UTERUS  10/17/1979   miscarriage   Social History   Occupational History   Occupation: bookkeeper  Tobacco Use   Smoking status: Former    Packs/day: 1.00    Years: 45.00    Total pack years: 45.00    Types: Cigarettes    Quit date: 2013    Years since quitting: 11.1   Smokeless tobacco: Never  Vaping Use   Vaping Use: Never used  Substance and Sexual Activity   Alcohol use: No   Drug use: No   Sexual activity: Yes    Partners: Male

## 2022-12-07 ENCOUNTER — Other Ambulatory Visit: Payer: Self-pay | Admitting: Surgical

## 2022-12-07 DIAGNOSIS — M48061 Spinal stenosis, lumbar region without neurogenic claudication: Secondary | ICD-10-CM | POA: Diagnosis not present

## 2022-12-07 DIAGNOSIS — M5416 Radiculopathy, lumbar region: Secondary | ICD-10-CM | POA: Diagnosis not present

## 2022-12-07 NOTE — Telephone Encounter (Signed)
Pt last in office 12/01/2022 MRI f/u L spine. Celebrex and gabapentin were helping symptoms noted at that visit.

## 2022-12-08 ENCOUNTER — Ambulatory Visit: Payer: Medicare HMO | Admitting: Adult Health

## 2022-12-08 MED ORDER — GABAPENTIN 100 MG PO CAPS: 100.0000 mg | ORAL_CAPSULE | Freq: Three times a day (TID) | ORAL | 0 refills | Status: DC

## 2022-12-08 NOTE — Telephone Encounter (Signed)
Ok to rf pls clala thx

## 2022-12-11 ENCOUNTER — Ambulatory Visit: Payer: Medicare HMO | Admitting: Adult Health

## 2022-12-11 DIAGNOSIS — Z4789 Encounter for other orthopedic aftercare: Secondary | ICD-10-CM | POA: Diagnosis not present

## 2022-12-28 DIAGNOSIS — Z4789 Encounter for other orthopedic aftercare: Secondary | ICD-10-CM | POA: Diagnosis not present

## 2022-12-28 DIAGNOSIS — M25631 Stiffness of right wrist, not elsewhere classified: Secondary | ICD-10-CM | POA: Diagnosis not present

## 2022-12-28 DIAGNOSIS — M25641 Stiffness of right hand, not elsewhere classified: Secondary | ICD-10-CM | POA: Diagnosis not present

## 2022-12-29 ENCOUNTER — Ambulatory Visit (HOSPITAL_BASED_OUTPATIENT_CLINIC_OR_DEPARTMENT_OTHER): Payer: Medicare HMO

## 2023-01-05 ENCOUNTER — Ambulatory Visit: Payer: Medicare HMO | Admitting: Adult Health

## 2023-01-11 DIAGNOSIS — M25641 Stiffness of right hand, not elsewhere classified: Secondary | ICD-10-CM | POA: Diagnosis not present

## 2023-01-11 DIAGNOSIS — M25631 Stiffness of right wrist, not elsewhere classified: Secondary | ICD-10-CM | POA: Diagnosis not present

## 2023-01-16 ENCOUNTER — Ambulatory Visit: Payer: Medicare HMO | Admitting: Adult Health

## 2023-01-18 ENCOUNTER — Ambulatory Visit: Payer: Medicare HMO | Admitting: Adult Health

## 2023-01-18 ENCOUNTER — Encounter: Payer: Self-pay | Admitting: Adult Health

## 2023-01-18 VITALS — BP 108/70 | HR 62 | Temp 97.7°F | Ht 62.0 in | Wt 189.9 lb

## 2023-01-18 DIAGNOSIS — Z87891 Personal history of nicotine dependence: Secondary | ICD-10-CM

## 2023-01-18 DIAGNOSIS — J310 Chronic rhinitis: Secondary | ICD-10-CM

## 2023-01-18 DIAGNOSIS — G4733 Obstructive sleep apnea (adult) (pediatric): Secondary | ICD-10-CM | POA: Diagnosis not present

## 2023-01-18 NOTE — Progress Notes (Signed)
@Patient  ID: Mandy [name], female    DOB: Dec 27, 1945, 77 y.o.   MRN: [id number]  Chief Complaint  Patient presents with   Follow-up    Referring provider: Bradd Canary, MD  HPI: 77 year old female followed for obstructive sleep apnea chronic allergic rhinitis  TEST/EVENTS :  HST 10/26/17 >> AHI 17.4, SaO2 low 79% Auto CPAP 04/15/19 to 05/14/19 >> used on 19 of 30 nights with average 5 hrs 33 min.  Average AHI 0.8 with median CPAP 7 and 95 th percentile CPAP 10 cm H2O  01/18/2023 Follow up : OSA and AR  Patient presents for a 1 year follow-up.  Patient has underlying moderate obstructive sleep apnea on nocturnal CPAP.  She continues to struggle with CPAP.  Says that she tried the DreamWear nasal mask but did not like it.  Has recently gotten a new mask but has been having difficulty because she recently had right hand surgery that has been taking a long time to heal.  She says her sleep has been fragmented due to hand pain.  CPAP download shows 57% compliance.  Daily average usage of 4.5 hours.  Patient is on auto CPAP 5 to 15 cm H2O.  Daily average pressure at 10 cm H2O.  AHI 0.2/hour. She continues to have some nasal congestion and drainage.  She remains on Flonase and Astelin as needed.  Also has Allegra and chlor tabs if needed.  Patient is a former smoker.  She participates in the lung cancer screening program.  Patient says that she does need her CT chest rescheduled due to her hand issues.   Allergies  Allergen Reactions   Codeine    Pneumococcal Vaccine Polyvalent     REACTION: rash at injection site and induration.   Red Yeast Rice Extract     Leg pain    Immunization History  Administered Date(s) Administered   Fluad Quad(high Dose 65+) 07/12/2019, 07/13/2020, 06/21/2021, 07/14/2022   Hepatitis B 04/13/2008, 06/15/2008, 12/17/2008   Influenza Split 08/03/2011, 07/31/2012   Influenza, High Dose Seasonal PF 07/23/2013, 07/30/2015, 07/14/2016, 07/26/2017, 07/19/2018    Influenza,inj,Quad PF,6+ Mos 07/08/2014   PFIZER(Purple Top)SARS-COV-2 Vaccination 11/21/2019, 12/15/2019   Pneumococcal Conjugate-13 10/30/2013   Pneumococcal Polysaccharide-23 12/23/2009, 12/21/2015   Td 05/11/2010   Tdap 05/07/2022   Zoster Recombinat (Shingrix) 06/27/2019, 09/17/2019   Zoster, Live 09/23/2008    Past Medical History:  Diagnosis Date   Atrophic vaginitis 11/18/2012   Back pain    Chicken pox as a child   Cough 07/14/2016   Dermatitis 04/26/2011   GERD (gastroesophageal reflux disease) 03/20/2017   High cholesterol    HTN (hypertension) 04/13/2010   Qualifier: Diagnosis of  By: Abner Greenspan MD, Stacey     Hypertension    Measles as a child   Miscarriage 1981   Onychomycosis 04/26/2011   OSA (obstructive sleep apnea) 10/30/2017   Osteopenia 10/17/1999   Osteopenia 04/13/2010   Qualifier: Diagnosis of  By: Abner Greenspan MD, Stacey     Preventative health care 07/14/2016   Screening for malignant neoplasm of the cervix 04/26/2011   Sleep apnea    Tinea pedis 05/10/2014   Urinary frequency 03/20/2017    Tobacco History: Social History   Tobacco Use  Smoking Status Former   Packs/day: 1.00   Years: 45.00   Additional pack years: 0.00   Total pack years: 45.00   Types: Cigarettes   Quit date: 2013   Years since quitting: 11.2  Smokeless Tobacco Never   Counseling given:  Not Answered   Outpatient Medications Prior to Visit  Medication Sig Dispense Refill   amLODipine (NORVASC) 5 MG tablet Take 1 tablet (5 mg total) by mouth daily. 30 tablet 3   ammonium lactate (LAC-HYDRIN) 12 % cream Apply 1 application on to the skin as needed for dry skin. 385 g 0   aspirin 81 MG tablet Take 81 mg by mouth daily.     azelastine (ASTELIN) 0.1 % nasal spray Place 1 spray into both nostrils daily as needed for rhinitis. Use in each nostril as directed 30 mL 5   b complex vitamins tablet Take 1 tablet by mouth daily.     calcium citrate-vitamin D (CITRACAL+D) 315-200 MG-UNIT per tablet Take 1  tablet by mouth 2 (two) times daily.     ezetimibe (ZETIA) 10 MG tablet Take 10 mg by mouth daily.     Methylsulfonylmethane (MSM PO) Take 2 capsules by mouth daily. 1,000 mg 3 x daily     multivitamin (THERAGRAN) per tablet Take 1 tablet by mouth daily.     mupirocin ointment (BACTROBAN) 2 % APPLY INTO THE NOSE AT BEDTIME AS NEEDED. 22 g 1   Nutritional Supplements (VITAMIN D BOOSTER PO) Take by mouth.     Omega-3 Fatty Acids-Vitamin E (COROMEGA PO) Take by mouth.     Probiotic Product (PROBIOTIC DAILY PO) Take by mouth daily.     triamterene-hydrochlorothiazide (DYAZIDE) 37.5-25 MG capsule TAKE ONE CAPSULE DAILY 90 capsule 0   TURMERIC PO Take 1 capsule by mouth daily.      vitamin C (ASCORBIC ACID) 500 MG tablet Take 500 mg by mouth daily.     Zinc Acetate, Oral, (ZINC ACETATE PO) Take by mouth.     celecoxib (CELEBREX) 100 MG capsule Take 1 capsule (100 mg total) by mouth 2 (two) times daily. Do not take with Ibuprofen. 60 capsule 0   ezetimibe (ZETIA) 10 MG tablet Take 1 tablet (10 mg total) by mouth daily. (Patient not taking: Reported on 01/18/2023) 90 tablet 3   gabapentin (NEURONTIN) 100 MG capsule Take 1 capsule (100 mg total) by mouth 3 (three) times daily. (Patient not taking: Reported on 01/18/2023) 60 capsule 0   PREMARIN vaginal cream APPLY A SMALL AMOUNT (APPROXIMATELY 0.25G)PER VAGINA 3 TIMES A WEEK AT BEDTIME. (Patient not taking: Reported on 01/18/2023) 30 g 0   Red Yeast Rice Extract (RED YEAST RICE PO) Take by mouth.     No facility-administered medications prior to visit.     Review of Systems:   Constitutional:   No  weight loss, night sweats,  Fevers, chills, fatigue, or  lassitude.  HEENT:   No headaches,  Difficulty swallowing,  Tooth/dental problems, or  Sore throat,                No sneezing, itching, ear ache,  +nasal congestion, post nasal drip,   CV:  No chest pain,  Orthopnea, PND, swelling in lower extremities, anasarca, dizziness, palpitations, syncope.    GI  No heartburn, indigestion, abdominal pain, nausea, vomiting, diarrhea, change in bowel habits, loss of appetite, bloody stools.   Resp: No shortness of breath with exertion or at rest.  No excess mucus, no productive cough,  No non-productive cough,  No coughing up of blood.  No change in color of mucus.  No wheezing.  No chest wall deformity  Skin: no rash or lesions.  GU: no dysuria, change in color of urine, no urgency or frequency.  No flank pain, no  hematuria   MS:  No joint pain or swelling.  No decreased range of motion.  No back pain.    Physical Exam  BP 108/70 (BP Location: Left Arm, Patient Position: Sitting, Cuff Size: Normal)   Pulse 62   Temp 97.7 F (36.5 C) (Oral)   Ht  (1.575 m)   Wt 189 lb 14.4 oz (86.1 kg)   SpO2 97%   BMI 34.73 kg/m   GEN: A/Ox3; pleasant , NAD, well nourished    HEENT:  [place]/AT,  NOSE-clear, THROAT-clear, no lesions, no postnasal drip or exudate noted.   NECK:  Supple w/ fair ROM; no JVD; normal carotid impulses w/o bruits; no thyromegaly or nodules palpated; no lymphadenopathy.    RESP  Clear  P & A; w/o, wheezes/ rales/ or rhonchi. no accessory muscle use, no dullness to percussion  CARD:  RRR, no m/r/g, no peripheral edema, pulses intact, no cyanosis or clubbing.  GI:   Soft & nt; nml bowel sounds; no organomegaly or masses detected.   Musco: Warm bil, no deformities or joint swelling noted. Hand splint   Neuro: alert, no focal deficits noted.    Skin: Warm, no lesions or rashes    Lab Results:   BNP No results found for: "BNP"  ProBNP No results found for: "PROBNP"  Imaging: No results found.        No data to display          No results found for: "NITRICOXIDE"      Assessment & Plan:   No problem-specific Assessment & Plan notes found for this encounter.     Rubye Oaks, NP 01/18/2023

## 2023-01-18 NOTE — Patient Instructions (Signed)
Allegra 180mg  daily As needed   Chlorpheniramine 4mg  (Chlortab) At bedtime  As needed  Drainage  Saline nasal spray Twice daily   Saline nasal gel At bedtime  .  Flonase 1 puff daily in am As needed   Astelin 1 puffs daily in pm . As needed   Continue on CPAP At bedtime  -wear each night  Try to wear each night for at least 6hrs each night  Stay active , healthy diet  Do not drive if sleepy  Reschedule Lung cancer CT screening  Follow up with Dr. Halford Chessman or Toyia Jelinek NP  In 6 months  and As needed

## 2023-01-22 NOTE — Progress Notes (Signed)
Reviewed and agree with assessment/plan.   Coralyn Helling, MD [facility] Pulmonary/Critical Care 01/22/2023, 11:41 AM Pager:  [phone]

## 2023-01-22 NOTE — Assessment & Plan Note (Signed)
Continue with lung cancer CT screening program.

## 2023-01-22 NOTE — Assessment & Plan Note (Signed)
Moderate obstructive sleep apnea encouraged on CPAP compliance  Plan  Patient Instructions  Allegra 180mg  daily As needed   Chlorpheniramine 4mg  (Chlortab) At bedtime  As needed  Drainage  Saline nasal spray Twice daily   Saline nasal gel At bedtime  .  Flonase 1 puff daily in am As needed   Astelin 1 puffs daily in pm . As needed   Continue on CPAP At bedtime  -wear each night  Try to wear each night for at least 6hrs each night  Stay active , healthy diet  Do not drive if sleepy  Reschedule Lung cancer CT screening  Follow up with Dr. Craige Cotta or Roseanne Juenger NP  In 6 months  and As needed

## 2023-01-22 NOTE — Assessment & Plan Note (Signed)
Continue on current maintenance regimen 

## 2023-01-23 DIAGNOSIS — M25641 Stiffness of right hand, not elsewhere classified: Secondary | ICD-10-CM | POA: Diagnosis not present

## 2023-01-25 ENCOUNTER — Other Ambulatory Visit: Payer: Self-pay | Admitting: Family

## 2023-01-25 NOTE — Telephone Encounter (Signed)
FYI for TP-    Hi Tammy, I just wanted you to know that our visit last week was beneficial to me.  You gently scared me regarding using the CPAP machine.  Unfortunately, noone had ever told me the consequences of not using the machine.  I have faithfully worn the CPAP every night since seeing you and have slept all night since for the first time in years. Thank you, thank you, Willett Budzynski 21-Dec-2045

## 2023-01-29 DIAGNOSIS — Z4789 Encounter for other orthopedic aftercare: Secondary | ICD-10-CM | POA: Diagnosis not present

## 2023-02-05 DIAGNOSIS — G4733 Obstructive sleep apnea (adult) (pediatric): Secondary | ICD-10-CM | POA: Diagnosis not present

## 2023-02-16 ENCOUNTER — Other Ambulatory Visit: Payer: Self-pay | Admitting: Family Medicine

## 2023-02-26 DIAGNOSIS — Z4789 Encounter for other orthopedic aftercare: Secondary | ICD-10-CM | POA: Diagnosis not present

## 2023-02-26 DIAGNOSIS — M24139 Other articular cartilage disorders, unspecified wrist: Secondary | ICD-10-CM | POA: Diagnosis not present

## 2023-02-26 DIAGNOSIS — M25531 Pain in right wrist: Secondary | ICD-10-CM | POA: Diagnosis not present

## 2023-03-14 DIAGNOSIS — H02403 Unspecified ptosis of bilateral eyelids: Secondary | ICD-10-CM | POA: Diagnosis not present

## 2023-03-23 DIAGNOSIS — G4733 Obstructive sleep apnea (adult) (pediatric): Secondary | ICD-10-CM

## 2023-03-23 NOTE — Telephone Encounter (Signed)
Tammy- please advise on pt email:  Hi Tammy, I met with [place] at [facility] yesterday for a new mask fitting.  I have been advised by Christoper Allegra that I am eligable fr a new CPAP machine.  [place] wanted me to check with you to see if you had received the paperwork from [employer] to prescribe a new machine and suggest that you recoomend the Resmed Airsense 11.  If you have any questions you can reach out to [place] at [phone] Option 3.   Thank you for your help. Happy Summer. Sarina Ill [birth year]

## 2023-03-26 NOTE — Telephone Encounter (Signed)
That is fine for new order as requested.

## 2023-04-04 DIAGNOSIS — G4733 Obstructive sleep apnea (adult) (pediatric): Secondary | ICD-10-CM | POA: Diagnosis not present

## 2023-04-06 ENCOUNTER — Ambulatory Visit
Admission: RE | Admit: 2023-04-06 | Discharge: 2023-04-06 | Disposition: A | Discharge status: Home / Self Care | Payer: Medicare HMO | Source: Ambulatory Visit | Attending: Urgent Care | Admitting: Urgent Care

## 2023-04-06 ENCOUNTER — Ambulatory Visit (INDEPENDENT_AMBULATORY_CARE_PROVIDER_SITE_OTHER): Payer: Medicare HMO

## 2023-04-06 VITALS — BP 158/90 | HR 71 | Temp 98.9°F | Resp 20

## 2023-04-06 DIAGNOSIS — J189 Pneumonia, unspecified organism: Secondary | ICD-10-CM

## 2023-04-06 DIAGNOSIS — J018 Other acute sinusitis: Secondary | ICD-10-CM | POA: Diagnosis not present

## 2023-04-06 DIAGNOSIS — R059 Cough, unspecified: Secondary | ICD-10-CM | POA: Diagnosis not present

## 2023-04-06 MED ORDER — PROMETHAZINE-DM 6.25-15 MG/5ML PO SYRP: 5.0000 mL | ORAL_SOLUTION | Freq: Every evening | ORAL | 0 refills | Status: DC | PRN

## 2023-04-06 MED ORDER — AMOXICILLIN-POT CLAVULANATE 875-125 MG PO TABS: 1.0000 | ORAL_TABLET | Freq: Two times a day (BID) | ORAL | 0 refills | Status: DC

## 2023-04-06 MED ORDER — AZITHROMYCIN 250 MG PO TABS: ORAL_TABLET | ORAL | 0 refills | Status: DC

## 2023-04-06 NOTE — ED Provider Notes (Signed)
[facility]  Note:  This document was prepared using Conservation officer, historic buildings and may include unintentional dictation errors.  MRN: [id number] DOB: 07-09-1946  Subjective:   Mandy [name] is a 77 y.o. female presenting for 10 day history of acute onset persistent dry hacking cough, sinus and head congestion, drainage. Has used nasal sprays, otc medications with minimal relief. Has a history of walking pneumonia and wants to make sure this is ruled out. No chest pain, shob, wheezing. No smoking of any kind including cigarettes, cigars, vaping, marijuana use.  No asthma.   No current facility-administered medications for this encounter.  Current Outpatient Medications:    amLODipine (NORVASC) 5 MG tablet, Take 1 tablet (5 mg total) by mouth daily., Disp: 90 tablet, Rfl: 1   ammonium lactate (LAC-HYDRIN) 12 % cream, Apply 1 application on to the skin as needed for dry skin., Disp: 385 g, Rfl: 0   aspirin 81 MG tablet, Take 81 mg by mouth daily., Disp: , Rfl:    azelastine (ASTELIN) 0.1 % nasal spray, Place 1 spray into both nostrils daily as needed for rhinitis. Use in each nostril as directed, Disp: 30 mL, Rfl: 5   b complex vitamins tablet, Take 1 tablet by mouth daily., Disp: , Rfl:    calcium citrate-vitamin D (CITRACAL+D) 315-200 MG-UNIT per tablet, Take 1 tablet by mouth 2 (two) times daily., Disp: , Rfl:    ezetimibe (ZETIA) 10 MG tablet, Take 10 mg by mouth daily., Disp: , Rfl:    Methylsulfonylmethane (MSM PO), Take 2 capsules by mouth daily. 1,000 mg 3 x daily, Disp: , Rfl:    multivitamin (THERAGRAN) per tablet, Take 1 tablet by mouth daily., Disp: , Rfl:    mupirocin ointment (BACTROBAN) 2 %, APPLY INTO THE NOSE AT BEDTIME AS NEEDED., Disp: 22 g, Rfl: 1   Nutritional Supplements (VITAMIN D BOOSTER PO), Take by mouth., Disp: , Rfl:    Omega-3 Fatty Acids-Vitamin E (COROMEGA PO), Take by mouth., Disp: , Rfl:    Probiotic Product (PROBIOTIC DAILY PO),  Take by mouth daily., Disp: , Rfl:    triamterene-hydrochlorothiazide (DYAZIDE) 37.5-25 MG capsule, TAKE ONE CAPSULE DAILY, Disp: 90 capsule, Rfl: 0   TURMERIC PO, Take 1 capsule by mouth daily. , Disp: , Rfl:    vitamin C (ASCORBIC ACID) 500 MG tablet, Take 500 mg by mouth daily., Disp: , Rfl:    Zinc Acetate, Oral, (ZINC ACETATE PO), Take by mouth., Disp: , Rfl:    Allergies  Allergen Reactions   Codeine    Pneumococcal Vaccine Polyvalent     REACTION: rash at injection site and induration.   Red Yeast Rice Extract     Leg pain    Past Medical History:  Diagnosis Date   Atrophic vaginitis 11/18/2012   Back pain    Chicken pox as a child   Cough 07/14/2016   Dermatitis 04/26/2011   GERD (gastroesophageal reflux disease) 03/20/2017   High cholesterol    HTN (hypertension) 04/13/2010   Qualifier: Diagnosis of  By: Abner Greenspan MD, Stacey     Hypertension    Measles as a child   Miscarriage 1981   Onychomycosis 04/26/2011   OSA (obstructive sleep apnea) 10/30/2017   Osteopenia 10/17/1999   Osteopenia 04/13/2010   Qualifier: Diagnosis of  By: Abner Greenspan MD, Stacey     Preventative health care 07/14/2016   Screening for malignant neoplasm of the cervix 04/26/2011   Sleep apnea    Tinea  pedis 05/10/2014   Urinary frequency 03/20/2017     Past Surgical History:  Procedure Laterality Date   cataracts Bilateral 2023   DILATION AND CURETTAGE OF UTERUS  10/17/1979   miscarriage    Family History  Problem Relation Age of Onset   Osteoporosis Mother    Coronary artery disease Mother    Heart attack Mother 51   Hypertension Mother    Dementia Mother    Hyperlipidemia Mother    Stroke Mother    Obesity Mother    Diabetes Father        type 2   Heart disease Father    Stroke Father    Obesity Father    Dementia Brother        Lewy Body   Parkinson's disease Brother    Dementia Maternal Grandmother    Asthma Maternal Grandfather    Cancer Paternal Grandmother        ovarian   Dementia  Paternal Grandmother    Diabetes Paternal Grandfather        tyoe 2   Hearing loss Paternal Grandfather    Birth defects Maternal Aunt    Arthritis Other    Hyperlipidemia Other    Hypertension Other    Other Other        Cardiovascular disorder    Social History   Tobacco Use   Smoking status: Former    Packs/day: 1.00    Years: 45.00    Additional pack years: 0.00    Total pack years: 45.00    Types: Cigarettes    Quit date: 2013    Years since quitting: 11.4   Smokeless tobacco: Never  Vaping Use   Vaping Use: Never used  Substance Use Topics   Alcohol use: No   Drug use: No    ROS   Objective:   Vitals: BP (!) 158/90 (BP Location: Right Arm)   Pulse 71   Temp 98.9 F (37.2 C) (Oral)   Resp 20   SpO2 95%   Physical Exam Constitutional:      General: She is not in acute distress.    Appearance: Normal appearance. She is well-developed and normal weight. She is not ill-appearing, toxic-appearing or diaphoretic.  HENT:     Head: Normocephalic and atraumatic.     Right Ear: Tympanic membrane, ear canal and external ear normal. No drainage or tenderness. No middle ear effusion. There is no impacted cerumen. Tympanic membrane is not erythematous or bulging.     Left Ear: Tympanic membrane, ear canal and external ear normal. No drainage or tenderness.  No middle ear effusion. There is no impacted cerumen. Tympanic membrane is not erythematous or bulging.     Nose: Nose normal. No congestion or rhinorrhea.     Mouth/Throat:     Mouth: Mucous membranes are moist. No oral lesions.     Pharynx: No pharyngeal swelling, oropharyngeal exudate, posterior oropharyngeal erythema or uvula swelling.     Tonsils: No tonsillar exudate or tonsillar abscesses.  Eyes:     General: No scleral icterus.       Right eye: No discharge.        Left eye: No discharge.     Extraocular Movements: Extraocular movements intact.     Right eye: Normal extraocular motion.     Left eye:  Normal extraocular motion.     Conjunctiva/sclera: Conjunctivae normal.  Cardiovascular:     Rate and Rhythm: Normal rate and regular rhythm.  Heart sounds: Normal heart sounds. No murmur heard.    No friction rub. No gallop.  Pulmonary:     Effort: Pulmonary effort is normal. No respiratory distress.     Breath sounds: No stridor. No wheezing, rhonchi or rales.  Chest:     Chest wall: No tenderness.  Musculoskeletal:     Cervical back: Normal range of motion and neck supple.  Lymphadenopathy:     Cervical: No cervical adenopathy.  Skin:    General: Skin is warm and dry.  Neurological:     General: No focal deficit present.     Mental Status: She is alert and oriented to person, place, and time.  Psychiatric:        Mood and Affect: Mood normal.        Behavior: Behavior normal.        Thought Content: Thought content normal.        Judgment: Judgment normal.     Assessment and Plan :   PDMP not reviewed this encounter.  1. Community acquired pneumonia of right middle lobe of lung   2. Other acute sinusitis, recurrence not specified     Will treat this empirically for community-acquired pneumonia of the right middle lobe of her lung.  The radiology over read is pending but it is my clinical opinion that this is pneumonia based off of the images.  Will use double coverage with Augmentin and azithromycin.  Recommended supportive care otherwise.  Creatinine clearance was calculated out at 76 mL/min based off of her most recent levels.  Counseled patient on potential for adverse effects with medications prescribed/recommended today, ER and return-to-clinic precautions discussed, patient verbalized understanding.    Wallis Bamberg, PA-C 04/06/23 1308

## 2023-04-06 NOTE — Discharge Instructions (Signed)
We will manage this as a sinus infection with Augmentin (amoxicillin-clavulanate). For sore throat or cough try using a honey-based tea. Use 3 teaspoons of honey with juice squeezed from half lemon. Place shaved pieces of ginger into 1/2-1 cup of water and warm over stove top. Then mix the ingredients and repeat every 4 hours as needed. Please take Tylenol 500mg -650mg  every 6 hours for throat pain, fevers, aches and pains. Hydrate very well with at least 2 liters of water. Eat light meals such as soups (chicken and noodles, vegetable, chicken and wild rice).  Do not eat foods that you are allergic to.  Taking an antihistamine like Zyrtec can help against postnasal drainage, sinus congestion which can cause sinus pain, sinus headaches, throat pain, painful swallowing, coughing.  Use cough medication as needed.   I will update you with your chest x-ray results later today and make changes to the treatment plan and diagnosis as necessary.

## 2023-04-06 NOTE — ED Triage Notes (Signed)
Pt c/o head congestion, dry cough x 10 days-mild relief with antihistamines and nasal sprays-NAD-steady gait

## 2023-04-09 ENCOUNTER — Telehealth: Payer: Self-pay

## 2023-04-09 ENCOUNTER — Encounter: Payer: Self-pay | Admitting: Family Medicine

## 2023-04-09 NOTE — Telephone Encounter (Signed)
Pt called to clarify timeline for abx course. Rx and provider instructions reviewed with pt. Pt verbalized understanding. All questions addressed.

## 2023-04-16 ENCOUNTER — Encounter: Payer: Self-pay | Admitting: Family Medicine

## 2023-04-16 ENCOUNTER — Ambulatory Visit (INDEPENDENT_AMBULATORY_CARE_PROVIDER_SITE_OTHER): Payer: Medicare HMO | Admitting: Family Medicine

## 2023-04-16 VITALS — BP 126/69 | HR 61 | Temp 97.5°F | Ht 62.0 in | Wt 191.0 lb

## 2023-04-16 DIAGNOSIS — J189 Pneumonia, unspecified organism: Secondary | ICD-10-CM

## 2023-04-16 NOTE — Progress Notes (Signed)
   Acute Office Visit  Subjective:     Patient ID: Mandy [name], female    DOB: 1946/04/23, 77 y.o.   MRN: [id number]  Chief Complaint  Patient presents with   Pneumonia    Follow up     Patient is in today for pneumonia follow-up.   Discussed the use of AI scribe software for clinical note transcription with the patient, who gave verbal consent to proceed.  History of Present Illness   The patient, with a recent diagnosis of pneumonia and sinusitis, presents for a follow-up after completing a course of Augmentin and a Z-Pak. She reports feeling 'pretty much' back to normal, with only an occasional cough, but no production of blood or mucus. Denies chest pain, dyspnea, wheezing, fevers, chills, body aches.           ROS All review of systems negative except what is listed in the HPI      Objective:    BP 126/69   Pulse 61   Temp (!) 97.5 F (36.4 C) (Oral)   Ht 5\' 2"  (1.575 m)   Wt 191 lb (86.6 kg)   SpO2 96%   BMI 34.93 kg/m    Physical Exam Vitals reviewed.  Constitutional:      General: She is not in acute distress.    Appearance: Normal appearance. She is not ill-appearing.  Cardiovascular:     Rate and Rhythm: Normal rate and regular rhythm.     Pulses: Normal pulses.     Heart sounds: Normal heart sounds.  Pulmonary:     Effort: Pulmonary effort is normal. No respiratory distress.     Breath sounds: Normal breath sounds. No wheezing, rhonchi or rales.  Skin:    General: Skin is warm and dry.  Neurological:     Mental Status: She is alert and oriented to person, place, and time.  Psychiatric:        Mood and Affect: Mood normal.        Behavior: Behavior normal.        Thought Content: Thought content normal.        Judgment: Judgment normal.     No results found for any visits on 04/16/23.      Assessment & Plan:   Problem List Items Addressed This Visit   None Visit Diagnoses     Community acquired pneumonia of right lower lobe of  lung    -  Primary   Relevant Orders   DG Chest 2 View     Glad to hear you are feeling much better! Monitor for any new or worsening symptoms and keep [place] updated.  Repeat chest xray in 2-3 weeks to check per radiologist recommendations.   No orders of the defined types were placed in this encounter.   Return if symptoms worsen or fail to improve.  Clayborne Dana, NP

## 2023-04-16 NOTE — Patient Instructions (Addendum)
Glad to hear you are feeling much better! Monitor for any new or worsening symptoms and keep [place] updated.  Repeat chest xray in 2-3 weeks to check per radiologist recommendations.

## 2023-04-26 ENCOUNTER — Ambulatory Visit (HOSPITAL_BASED_OUTPATIENT_CLINIC_OR_DEPARTMENT_OTHER)
Admission: RE | Admit: 2023-04-26 | Discharge: 2023-04-26 | Disposition: A | Discharge status: Home / Self Care | Payer: Medicare HMO | Source: Ambulatory Visit | Attending: Family Medicine | Admitting: Family Medicine

## 2023-04-26 DIAGNOSIS — J189 Pneumonia, unspecified organism: Secondary | ICD-10-CM | POA: Insufficient documentation

## 2023-04-26 DIAGNOSIS — J9811 Atelectasis: Secondary | ICD-10-CM | POA: Diagnosis not present

## 2023-04-29 NOTE — Progress Notes (Signed)
Subjective:    Patient ID: Mandy [name], female    DOB: 10/14/1946, 77 y.o.   MRN: [id number]  Chief Complaint  Patient presents with  . Follow-up    Follow up    HPI Discussed the use of AI scribe software for clinical note transcription with the patient, who gave verbal consent to proceed.  History of Present Illness   The patient, with a history of back pain, hand surgery, cataract surgeries, pneumonia, and weight gain, presents with a complaint of occasional urinary dribbling. The patient reports that the dribbling occurs when getting up from bed or after sitting for prolonged periods. The patient denies any urinary urgency or incontinence. The patient also reports a recent history of pneumonia, which she feels fully recovered from. The patient underwent hand surgery in February and reports some cognitive issues post-surgery, which have improved significantly over the past month. The patient also underwent two cataract surgeries, which were successful. The patient has been managing her cholesterol with Zetia, prescribed by a cardiologist. The patient has been experiencing weight gain, which she attributes to inactivity due to COVID-19 and post-surgery recovery. The patient is now back to regular exercise and is following a diet high in protein and vegetables. She did bend over and hit her face hard on a piece of furniture, has had bruising on right side of face but is improving. no HA or syncope associated.    Patient is a 77 yo female in today for follow up on chronic medical concerns. No recent febrile illness or hospitalizations. Denies CP/palp/SOB/HA/congestion/fevers/GI or GU c/o. Taking meds as prescribed     Past Medical History:  Diagnosis Date  . Atrophic vaginitis 11/18/2012  . Back pain   . Chicken pox as a child  . Cough 07/14/2016  . Dermatitis 04/26/2011  . GERD (gastroesophageal reflux disease) 03/20/2017  . High cholesterol   . HTN (hypertension) 04/13/2010   Qualifier:  Diagnosis of  By: Abner Greenspan MD, Misty Stanley    . Hypertension   . Measles as a child  . Miscarriage 1981  . Onychomycosis 04/26/2011  . OSA (obstructive sleep apnea) 10/30/2017  . Osteopenia 10/17/1999  . Osteopenia 04/13/2010   Qualifier: Diagnosis of  By: Abner Greenspan MD, Misty Stanley    . Preventative health care 07/14/2016  . Screening for malignant neoplasm of the cervix 04/26/2011  . Sleep apnea   . Tinea pedis 05/10/2014  . Urinary frequency 03/20/2017    Past Surgical History:  Procedure Laterality Date  . cataracts Bilateral 2023  . DILATION AND CURETTAGE OF UTERUS  10/17/1979   miscarriage    Family History  Problem Relation Age of Onset  . Osteoporosis Mother   . Coronary artery disease Mother   . Heart attack Mother 61  . Hypertension Mother   . Dementia Mother   . Hyperlipidemia Mother   . Stroke Mother   . Obesity Mother   . Diabetes Father        type 2  . Heart disease Father   . Stroke Father   . Obesity Father   . Dementia Brother        Lewy Body  . Parkinson's disease Brother   . Dementia Maternal Grandmother   . Asthma Maternal Grandfather   . Cancer Paternal Grandmother        ovarian  . Dementia Paternal Grandmother   . Diabetes Paternal Grandfather        tyoe 2  . Hearing loss Paternal Grandfather   .  Birth defects Maternal Aunt   . Arthritis Other   . Hyperlipidemia Other   . Hypertension Other   . Other Other        Cardiovascular disorder    Social History   Socioeconomic History  . Marital status: Married    Spouse name: Thayer Ohm  . Number of children: Not on file  . Years of education: Not on file  . Highest education level: Bachelor's degree (e.g., BA, AB, BS)  Occupational History  . Occupation: bookkeeper  Tobacco Use  . Smoking status: Former    Current packs/day: 0.00    Average packs/day: 1 pack/day for 45.0 years (45.0 ttl pk-yrs)    Types: Cigarettes    Start date: 71    Quit date: 2013    Years since quitting: 11.5  . Smokeless tobacco:  Never  Vaping Use  . Vaping status: Never Used  Substance and Sexual Activity  . Alcohol use: No  . Drug use: No  . Sexual activity: Yes    Partners: Male  Other Topics Concern  . Not on file  Social History Narrative   Regular exercise- yes   Does Yoga 3-4 X week, personal trainer 1 X week   and walks regularly   Has lost 16 pounds by minimizing processed foods and meats   Social Determinants of Health   Financial Resource Strain: Low Risk  (04/12/2023)   Overall Financial Resource Strain (CARDIA)   . Difficulty of Paying Living Expenses: Not hard at all  Food Insecurity: No Food Insecurity (04/12/2023)   Hunger Vital Sign   . Worried About Programme researcher, broadcasting/film/video in the Last Year: Never true   . Ran Out of Food in the Last Year: Never true  Transportation Needs: No Transportation Needs (04/12/2023)   PRAPARE - Transportation   . Lack of Transportation (Medical): No   . Lack of Transportation (Non-Medical): No  Physical Activity: Sufficiently Active (04/12/2023)   Exercise Vital Sign   . Days of Exercise per Week: 5 days   . Minutes of Exercise per Session: 30 min  Stress: No Stress Concern Present (04/12/2023)   [employer] of Occupational Health - Occupational Stress Questionnaire   . Feeling of Stress : Not at all  Social Connections: Socially Integrated (04/12/2023)   Social Connection and Isolation Panel [NHANES]   . Frequency of Communication with Friends and Family: Three times a week   . Frequency of Social Gatherings with Friends and Family: Twice a week   . Attends Religious Services: 1 to 4 times per year   . Active Member of Clubs or Organizations: Yes   . Attends Banker Meetings: 1 to 4 times per year   . Marital Status: Married  Catering manager Violence: Not on file    Outpatient Medications Prior to Visit  Medication Sig Dispense Refill  . amLODipine (NORVASC) 5 MG tablet Take 1 tablet (5 mg total) by mouth daily. 90 tablet 1  . aspirin 81  MG tablet Take 81 mg by mouth daily.    Marland Kitchen azelastine (ASTELIN) 0.1 % nasal spray Place 1 spray into both nostrils daily as needed for rhinitis. Use in each nostril as directed 30 mL 5  . b complex vitamins tablet Take 1 tablet by mouth daily.    . calcium citrate-vitamin D (CITRACAL+D) 315-200 MG-UNIT per tablet Take 1 tablet by mouth 2 (two) times daily.    . Methylsulfonylmethane (MSM PO) Take 2 capsules by mouth daily. 1,000 mg  3 x daily    . multivitamin (THERAGRAN) per tablet Take 1 tablet by mouth daily.    . mupirocin ointment (BACTROBAN) 2 % APPLY INTO THE NOSE AT BEDTIME AS NEEDED. 22 g 1  . Nutritional Supplements (VITAMIN D BOOSTER PO) Take by mouth.    . Omega-3 Fatty Acids-Vitamin E (COROMEGA PO) Take by mouth.    . Probiotic Product (PROBIOTIC DAILY PO) Take by mouth daily.    Marland Kitchen triamterene-hydrochlorothiazide (DYAZIDE) 37.5-25 MG capsule TAKE ONE CAPSULE DAILY 90 capsule 0  . TURMERIC PO Take 1 capsule by mouth daily.     . vitamin C (ASCORBIC ACID) 500 MG tablet Take 500 mg by mouth daily.    Marland Kitchen ammonium lactate (LAC-HYDRIN) 12 % cream Apply 1 application on to the skin as needed for dry skin. 385 g 0  . ezetimibe (ZETIA) 10 MG tablet Take 10 mg by mouth daily.    . Zinc Acetate, Oral, (ZINC ACETATE PO) Take by mouth.     No facility-administered medications prior to visit.    Allergies  Allergen Reactions  . Codeine   . Pneumococcal Vaccine Polyvalent     REACTION: rash at injection site and induration.  . Red Yeast Rice Extract     Leg pain    Review of Systems  Constitutional:  Negative for fever and malaise/fatigue.  HENT:  Negative for congestion.   Eyes:  Negative for blurred vision.  Respiratory:  Negative for shortness of breath.   Cardiovascular:  Negative for chest pain, palpitations and leg swelling.  Gastrointestinal:  Negative for abdominal pain, blood in stool and nausea.  Genitourinary:  Positive for urgency. Negative for dysuria and frequency.   Musculoskeletal:  Negative for falls.  Skin:  Negative for rash.  Neurological:  Negative for dizziness, loss of consciousness and headaches.  Endo/Heme/Allergies:  Negative for environmental allergies.  Psychiatric/Behavioral:  Negative for depression. The patient is not nervous/anxious.        Objective:    Physical Exam Constitutional:      General: She is not in acute distress.    Appearance: Normal appearance. She is well-developed. She is not toxic-appearing.  HENT:     Head: Normocephalic and atraumatic.     Right Ear: External ear normal.     Left Ear: External ear normal.     Nose: Nose normal.  Eyes:     General:        Right eye: No discharge.        Left eye: No discharge.     Conjunctiva/sclera: Conjunctivae normal.  Neck:     Thyroid: No thyromegaly.  Cardiovascular:     Rate and Rhythm: Normal rate and regular rhythm.     Heart sounds: Normal heart sounds. No murmur heard. Pulmonary:     Effort: Pulmonary effort is normal. No respiratory distress.     Breath sounds: Normal breath sounds.  Abdominal:     General: Bowel sounds are normal.     Palpations: Abdomen is soft.     Tenderness: There is no abdominal tenderness. There is no guarding.  Musculoskeletal:        General: Normal range of motion.     Cervical back: Neck supple.  Lymphadenopathy:     Cervical: No cervical adenopathy.  Skin:    General: Skin is warm and dry.     Findings: Bruising present.     Comments: Bruising right side of face around eye and down to upper cheek  Neurological:  Mental Status: She is alert and oriented to person, place, and time.  Psychiatric:        Mood and Affect: Mood normal.        Behavior: Behavior normal.        Thought Content: Thought content normal.        Judgment: Judgment normal.   BP 128/72 (BP Location: Left Arm, Patient Position: Sitting, Cuff Size: Normal)   Pulse 64   Temp (!) 97.5 F (36.4 C) (Oral)   Resp 16   Ht 5\' 2"  (1.575 m)   Wt  184 lb (83.5 kg)   SpO2 95%   BMI 33.65 kg/m  Wt Readings from Last 3 Encounters:  04/30/23 184 lb (83.5 kg)  04/16/23 191 lb (86.6 kg)  01/18/23 189 lb 14.4 oz (86.1 kg)    Diabetic Foot Exam - Simple   No data filed    Lab Results  Component Value Date   WBC 5.1 04/30/2023   HGB 14.0 04/30/2023   HCT 42.1 04/30/2023   PLT 350.0 04/30/2023   GLUCOSE 97 04/30/2023   CHOL 216 (H) 04/30/2023   TRIG 120.0 04/30/2023   HDL 60.70 04/30/2023   LDLDIRECT 113.4 11/12/2012   LDLCALC 131 (H) 04/30/2023   ALT 12 04/30/2023   AST 16 04/30/2023   NA 134 (L) 04/30/2023   K 4.7 04/30/2023   CL 97 04/30/2023   CREATININE 0.75 04/30/2023   BUN 13 04/30/2023   CO2 28 04/30/2023   TSH 1.77 04/30/2023   HGBA1C 5.6 04/30/2023    Lab Results  Component Value Date   TSH 1.77 04/30/2023   Lab Results  Component Value Date   WBC 5.1 04/30/2023   HGB 14.0 04/30/2023   HCT 42.1 04/30/2023   MCV 96.3 04/30/2023   PLT 350.0 04/30/2023   Lab Results  Component Value Date   NA 134 (L) 04/30/2023   K 4.7 04/30/2023   CO2 28 04/30/2023   GLUCOSE 97 04/30/2023   BUN 13 04/30/2023   CREATININE 0.75 04/30/2023   BILITOT 0.9 04/30/2023   ALKPHOS 55 04/30/2023   AST 16 04/30/2023   ALT 12 04/30/2023   PROT 6.8 04/30/2023   ALBUMIN 4.2 04/30/2023   CALCIUM 10.4 04/30/2023   EGFR 77 06/08/2021   GFR 76.31 04/30/2023   Lab Results  Component Value Date   CHOL 216 (H) 04/30/2023   Lab Results  Component Value Date   HDL 60.70 04/30/2023   Lab Results  Component Value Date   LDLCALC 131 (H) 04/30/2023   Lab Results  Component Value Date   TRIG 120.0 04/30/2023   Lab Results  Component Value Date   CHOLHDL 4 04/30/2023   Lab Results  Component Value Date   HGBA1C 5.6 04/30/2023       Assessment & Plan:  Vitamin D deficiency Assessment & Plan: Supplement and monitor  Orders: -     VITAMIN D 25 Hydroxy (Vit-D Deficiency, Fractures)  Mixed  hyperlipidemia Assessment & Plan: Encourage heart healthy diet such as MIND or DASH diet, increase exercise, avoid trans fats, simple carbohydrates and processed foods, consider a krill or fish or flaxseed oil cap daily.    Osteopenia, unspecified location Assessment & Plan: Encouraged to get adequate exercise, calcium and vitamin d intake   Orders: -     Lipid panel  Essential hypertension Assessment & Plan: Well controlled, no changes to meds. Encouraged heart healthy diet such as the DASH diet and exercise as tolerated.  Orders: -     CBC with Differential/Platelet -     Comprehensive metabolic panel -     TSH  Hyperglycemia Assessment & Plan: hgba1c acceptable, minimize simple carbs. Increase exercise as tolerated. Continue current meds  Orders: -     Hemoglobin A1c  Urinary incontinence, unspecified type -     Urinalysis, Routine w reflex microscopic -     Urine Culture  Primary hypertension  Other orders -     Ezetimibe; Take 1 tablet (10 mg total) by mouth daily.  Dispense: 90 tablet; Refill: 3    Assessment and Plan    Post-operative cognitive dysfunction: Patient reports improvement in mental clarity following hand surgery in February. Encouraged continued adherence to healthy lifestyle habits to further support cognitive recovery. -Continue current lifestyle modifications including regular physical activity and a diet high in protein and vegetables.  Urinary incontinence: Patient reports occasional dribbling, particularly upon standing. No signs of urinary tract infection at this time. -Collect urine sample for urinalysis. -Initiate Kegel exercises, aiming for 10 repetitions twice daily. -Consider pelvic floor physical therapy if self-directed exercises are insufficient.  Hyperlipidemia: Patient currently managed on Zetia (ezetimibe) prescribed by a specialist. Patient requests transition of prescription management to primary care. -Renew Zetia  prescription and send to [facility]. -Continue monitoring lipid levels and consider referral back to specialist if levels increase despite current therapy.  Pneumonia: Patient reports full recovery from recent pneumonia. Awaiting results of recent chest x-ray. -Await results of chest x-ray taken on 04/26/2023. -Continue current management as tolerated.  General Health Maintenance: -Continue daily probiotic and consider adding daily cranberry tablet (AZO) for urinary health. -Consider following the MIND diet for overall health and inflammation reduction. -Plan for comprehensive annual check-up before the end of the year.         Danise Edge, MD

## 2023-04-29 NOTE — Assessment & Plan Note (Signed)
hgba1c acceptable, minimize simple carbs. Increase exercise as tolerated. Continue current meds 

## 2023-04-29 NOTE — Assessment & Plan Note (Signed)
Well controlled, no changes to meds. Encouraged heart healthy diet such as the DASH diet and exercise as tolerated.  °

## 2023-04-29 NOTE — Assessment & Plan Note (Signed)
Encouraged to get adequate exercise, calcium and vitamin d intake 

## 2023-04-29 NOTE — Assessment & Plan Note (Signed)
Supplement and monitor 

## 2023-04-29 NOTE — Assessment & Plan Note (Signed)
Encourage heart healthy diet such as MIND or DASH diet, increase exercise, avoid trans fats, simple carbohydrates and processed foods, consider a krill or fish or flaxseed oil cap daily.  °

## 2023-04-30 ENCOUNTER — Ambulatory Visit (INDEPENDENT_AMBULATORY_CARE_PROVIDER_SITE_OTHER): Payer: Medicare HMO | Admitting: Family Medicine

## 2023-04-30 VITALS — BP 128/72 | HR 64 | Temp 97.5°F | Resp 16 | Ht 62.0 in | Wt 184.0 lb

## 2023-04-30 DIAGNOSIS — I10 Essential (primary) hypertension: Secondary | ICD-10-CM | POA: Diagnosis not present

## 2023-04-30 DIAGNOSIS — E559 Vitamin D deficiency, unspecified: Secondary | ICD-10-CM

## 2023-04-30 DIAGNOSIS — R739 Hyperglycemia, unspecified: Secondary | ICD-10-CM

## 2023-04-30 DIAGNOSIS — R32 Unspecified urinary incontinence: Secondary | ICD-10-CM

## 2023-04-30 DIAGNOSIS — E782 Mixed hyperlipidemia: Secondary | ICD-10-CM | POA: Diagnosis not present

## 2023-04-30 DIAGNOSIS — M8580 Other specified disorders of bone density and structure, unspecified site: Secondary | ICD-10-CM

## 2023-04-30 LAB — URINALYSIS, ROUTINE W REFLEX MICROSCOPIC
Bilirubin Urine: NEGATIVE
Hgb urine dipstick: NEGATIVE
Ketones, ur: NEGATIVE
Leukocytes,Ua: NEGATIVE
Nitrite: NEGATIVE
RBC / HPF: NONE SEEN (ref 0–?)
Specific Gravity, Urine: 1.015 (ref 1.000–1.030)
Total Protein, Urine: NEGATIVE
Urine Glucose: NEGATIVE
Urobilinogen, UA: 0.2 (ref 0.0–1.0)
WBC, UA: NONE SEEN (ref 0–?)
pH: 7 (ref 5.0–8.0)

## 2023-04-30 LAB — CBC WITH DIFFERENTIAL/PLATELET
Basophils Absolute: 0.1 10*3/uL (ref 0.0–0.1)
Basophils Relative: 1.4 % (ref 0.0–3.0)
Eosinophils Absolute: 0.1 10*3/uL (ref 0.0–0.7)
Eosinophils Relative: 2.6 % (ref 0.0–5.0)
HCT: 42.1 % (ref 36.0–46.0)
Hemoglobin: 14 g/dL (ref 12.0–15.0)
Lymphocytes Relative: 31.1 % (ref 12.0–46.0)
Lymphs Abs: 1.6 10*3/uL (ref 0.7–4.0)
MCHC: 33.2 g/dL (ref 30.0–36.0)
MCV: 96.3 fl (ref 78.0–100.0)
Monocytes Absolute: 0.5 10*3/uL (ref 0.1–1.0)
Monocytes Relative: 9.8 % (ref 3.0–12.0)
Neutro Abs: 2.8 10*3/uL (ref 1.4–7.7)
Neutrophils Relative %: 55.1 % (ref 43.0–77.0)
Platelets: 350 10*3/uL (ref 150.0–400.0)
RBC: 4.37 Mil/uL (ref 3.87–5.11)
RDW: 15 % (ref 11.5–15.5)
WBC: 5.1 10*3/uL (ref 4.0–10.5)

## 2023-04-30 LAB — VITAMIN D 25 HYDROXY (VIT D DEFICIENCY, FRACTURES): VITD: 90.15 ng/mL (ref 30.00–100.00)

## 2023-04-30 LAB — HEMOGLOBIN A1C: Hgb A1c MFr Bld: 5.6 % (ref 4.6–6.5)

## 2023-04-30 LAB — TSH: TSH: 1.77 u[IU]/mL (ref 0.35–5.50)

## 2023-04-30 MED ORDER — EZETIMIBE 10 MG PO TABS: 10.0000 mg | ORAL_TABLET | Freq: Every day | ORAL | 3 refills | Status: DC

## 2023-04-30 NOTE — Patient Instructions (Addendum)
MIND diet   The Blue Zones  Kegel Exercises  Kegel exercises can help strengthen your pelvic floor muscles. The pelvic floor is a group of muscles that support your rectum, small intestine, and bladder. In females, pelvic floor muscles also help support the uterus. These muscles help you control the flow of urine and stool (feces). Kegel exercises are painless and simple. They do not require any equipment. Your provider may suggest Kegel exercises to: Improve bladder and bowel control. Improve sexual response. Improve weak pelvic floor muscles after surgery to remove the uterus (hysterectomy) or after pregnancy, in females. Improve weak pelvic floor muscles after prostate gland removal or surgery, in males. Kegel exercises involve squeezing your pelvic floor muscles. These are the same muscles you squeeze when you try to stop the flow of urine or keep from passing gas. The exercises can be done while sitting, standing, or lying down, but it is best to vary your position. Ask your health care provider which exercises are safe for you. Do exercises exactly as told by your health care provider and adjust them as directed. Do not begin these exercises until told by your health care provider. Exercises How to do Kegel exercises: Squeeze your pelvic floor muscles tight. You should feel a tight lift in your rectal area. If you are a female, you should also feel a tightness in your vaginal area. Keep your stomach, buttocks, and legs relaxed. Hold the muscles tight for up to 10 seconds. Breathe normally. Relax your muscles for up to 10 seconds. Repeat as told by your health care provider. Repeat this exercise daily as told by your health care provider. Continue to do this exercise for at least 4-6 weeks, or for as long as told by your health care provider. You may be referred to a physical therapist who can help you learn more about how to do Kegel exercises. Depending on your condition, your health  care provider may recommend: Varying how long you squeeze your muscles. Doing several sets of exercises every day. Doing exercises for several weeks. Making Kegel exercises a part of your regular exercise routine. This information is not intended to replace advice given to you by your health care provider. Make sure you discuss any questions you have with your health care provider. Document Revised: 02/10/2021 Document Reviewed: 02/10/2021 Elsevier Patient Education  2024 [employer] Urinary Incontinence Urinary incontinence refers to a condition in which a person is unable to control where and when to pass urine. A person with this condition will urinate involuntarily. This means that the person urinates when he or she does not mean to. What are the causes? This condition may be caused by: Medicines. Infections. Constipation. Overactive bladder muscles. Weak bladder muscles. Weak pelvic floor muscles. These muscles provide support for the bladder, intestine, and, in women, the uterus. Enlarged prostate in men. The prostate is a gland near the bladder. When it gets too big, it can pinch the urethra. With the urethra blocked, the bladder can weaken and lose the ability to empty properly. Surgery. Emotional factors, such as anxiety, stress, or post-traumatic stress disorder (PTSD). Spinal cord injury, nerve injury, or other neurological conditions. Pelvic organ prolapse. This happens in women when organs move out of place and into the vagina. This movement can prevent the bladder and urethra from working properly. What increases the risk? The following factors may make you more likely to develop this condition: Age. The older you are, the higher the risk. Obesity. Being physically  inactive. Pregnancy and childbirth. Menopause. Diseases that affect the nerves or spinal cord. Long-term, or chronic, coughing. This can increase pressure on the bladder and pelvic floor muscles. What are  the signs or symptoms? Symptoms may vary depending on the type of urinary incontinence you have. They include: A sudden urge to urinate, and passing urine involuntarily before you can get to a bathroom (urge incontinence). Suddenly passing urine when doing activities that force urine to pass, such as coughing, laughing, exercising, or sneezing (stress incontinence). Needing to urinate often but urinating only a small amount, or constantly dribbling urine (overflow incontinence). Urinating because you cannot get to the bathroom in time due to a physical disability, such as arthritis or injury, or due to a communication or thinking problem, such as Alzheimer's disease (functional incontinence). How is this diagnosed? This condition may be diagnosed based on: Your medical history. A physical exam. Tests, such as: Urine tests. X-rays of your kidney and bladder. Ultrasound. CT scan. Cystoscopy. In this procedure, a health care provider inserts a tube with a light and camera (cystoscope) through the urethra and into the bladder to check for problems. Urodynamic testing. These tests assess how well the bladder, urethra, and sphincter can store and release urine. There are different types of urodynamic tests, and they vary depending on what the test is measuring. To help diagnose your condition, your health care provider may recommend that you keep a log of when you urinate and how much you urinate. How is this treated? Treatment for this condition depends on the type of incontinence that you have and its cause. Treatment may include: Lifestyle changes, such as: Quitting smoking. Maintaining a healthy weight. Staying active. Try to get 150 minutes of moderate-intensity exercise every week. Ask your health care provider which activities are safe for you. Eating a healthy diet. Avoid high-fat foods, like fried foods. Avoid refined carbohydrates like white bread and white rice. Limit how much alcohol  and caffeine you drink. Increase your fiber intake. Healthy sources of fiber include beans, whole grains, and fresh fruits and vegetables. Behavioral changes, such as: Pelvic floor muscle exercises. Bladder training, such as lengthening the amount of time between bathroom breaks, or using the bathroom at regular intervals. Using techniques to suppress bladder urges. This can include distraction techniques or controlled breathing exercises. Medicines, such as: Medicines to relax the bladder muscles and prevent bladder spasms. Medicines to help slow or prevent the growth of a man's prostate. Botox injections. These can help relax the bladder muscles. Treatments, such as: Using pulses of electricity to help change bladder reflexes (electrical nerve stimulation). For women, using a medical device to prevent urine leaks. This is a small, tampon-like, disposable device that is inserted into the urethra. Injecting collagen or carbon beads (bulking agents) into the urinary sphincter. These can help thicken tissue and close the bladder opening. Surgery. Follow these instructions at home: Lifestyle Limit alcohol and caffeine. These can fill your bladder quickly and irritate it. Keep yourself clean to help prevent odors and skin damage. Ask your health care provider about special skin creams and cleansers that can protect the skin from urine. Consider wearing pads or adult diapers. Make sure to change them regularly, and always change them right after experiencing incontinence. General instructions Take over-the-counter and prescription medicines only as told by your health care provider. Use the bathroom about every 3-4 hours, even if you do not feel the need to urinate. Try to empty your bladder completely every time. After  urinating, wait a minute. Then try to urinate again. Make sure you are in a relaxed position while urinating. If your incontinence is caused by nerve problems, keep a log of the  medicines you take and the times you go to the bathroom. Keep all follow-up visits. This is important. Where to find more information [employer] of Diabetes and Digestive and Kidney Diseases: CarFli[employer].tn American Urology Association: www.urologyhealth.org Contact a health care provider if: You have pain that gets worse. Your incontinence gets worse. Get help right away if: You have a fever or chills. You are unable to urinate. You have redness in your groin area or down your legs. Summary Urinary incontinence refers to a condition in which a person is unable to control where and when to pass urine. This condition may be caused by medicines, infection, weak bladder muscles, weak pelvic floor muscles, enlargement of the prostate (in men), or surgery. Factors such as older age, obesity, pregnancy and childbirth, menopause, neurological diseases, and chronic coughing may increase your risk for developing this condition. Types of urinary incontinence include urge incontinence, stress incontinence, overflow incontinence, and functional incontinence. This condition is usually treated first with lifestyle and behavioral changes, such as quitting smoking, eating a healthier diet, and doing regular pelvic floor exercises. Other treatment options include medicines, bulking agents, medical devices, electrical nerve stimulation, or surgery. This information is not intended to replace advice given to you by your health care provider. Make sure you discuss any questions you have with your health care provider. Document Revised: 05/07/2020 Document Reviewed: 05/07/2020 Elsevier Patient Education  2024 [employer].

## 2023-05-01 LAB — COMPREHENSIVE METABOLIC PANEL
ALT: 12 U/L (ref 0–35)
AST: 16 U/L (ref 0–37)
Albumin: 4.2 g/dL (ref 3.5–5.2)
Alkaline Phosphatase: 55 U/L (ref 39–117)
BUN: 13 mg/dL (ref 6–23)
CO2: 28 mEq/L (ref 19–32)
Calcium: 10.4 mg/dL (ref 8.4–10.5)
Chloride: 97 mEq/L (ref 96–112)
Creatinine, Ser: 0.75 mg/dL (ref 0.40–1.20)
GFR: 76.31 mL/min (ref >60.00)
Glucose, Bld: 97 mg/dL (ref 70–99)
Potassium: 4.7 mEq/L (ref 3.5–5.1)
Sodium: 134 mEq/L — ABNORMAL LOW (ref 135–145)
Total Bilirubin: 0.9 mg/dL (ref 0.2–1.2)
Total Protein: 6.8 g/dL (ref 6.0–8.3)

## 2023-05-01 LAB — LIPID PANEL
Cholesterol: 216 mg/dL — ABNORMAL HIGH (ref 0–200)
HDL: 60.7 mg/dL (ref >39.00)
LDL Cholesterol: 131 mg/dL — ABNORMAL HIGH (ref 0–99)
NonHDL: 154.83
Total CHOL/HDL Ratio: 4
Triglycerides: 120 mg/dL (ref 0.0–149.0)
VLDL: 24 mg/dL (ref 0.0–40.0)

## 2023-05-01 LAB — URINE CULTURE
MICRO NUMBER:: 15200040
SPECIMEN QUALITY:: ADEQUATE

## 2023-05-03 DIAGNOSIS — Z4789 Encounter for other orthopedic aftercare: Secondary | ICD-10-CM | POA: Diagnosis not present

## 2023-05-04 DIAGNOSIS — G4733 Obstructive sleep apnea (adult) (pediatric): Secondary | ICD-10-CM | POA: Diagnosis not present

## 2023-05-08 DIAGNOSIS — G4733 Obstructive sleep apnea (adult) (pediatric): Secondary | ICD-10-CM | POA: Diagnosis not present

## 2023-05-17 ENCOUNTER — Ambulatory Visit: Payer: Medicare HMO | Admitting: Adult Health

## 2023-05-17 ENCOUNTER — Other Ambulatory Visit: Payer: Self-pay | Admitting: Family Medicine

## 2023-05-17 ENCOUNTER — Encounter: Payer: Self-pay | Admitting: Adult Health

## 2023-05-17 VITALS — BP 130/80 | HR 58 | Temp 97.5°F | Ht 62.0 in | Wt 184.8 lb

## 2023-05-17 DIAGNOSIS — G4733 Obstructive sleep apnea (adult) (pediatric): Secondary | ICD-10-CM | POA: Diagnosis not present

## 2023-05-17 DIAGNOSIS — Z87891 Personal history of nicotine dependence: Secondary | ICD-10-CM | POA: Diagnosis not present

## 2023-05-17 DIAGNOSIS — J310 Chronic rhinitis: Secondary | ICD-10-CM

## 2023-05-17 NOTE — Assessment & Plan Note (Signed)
Excellent control compliance on nocturnal CPAP.-Continue on current setting Plan  . Patient Instructions  Allegra 180mg  daily As needed   Chlorpheniramine 4mg  (Chlortab) At bedtime  As needed  Drainage  Saline nasal spray Twice daily   Saline nasal gel At bedtime  .  Flonase 1 puff daily in am As needed   Astelin 1 puffs daily in pm . As needed   Continue on CPAP At bedtime  -wear each night  Keep up good work.  Change CPAP pressure to Auto CPAP 5-15cmH2O.  Stay active , healthy diet  Do not drive if sleepy  Reschedule Lung cancer CT screening  Follow up with Dawne Casali NP in 1 year and As needed

## 2023-05-17 NOTE — Assessment & Plan Note (Signed)
CT chest 2023 no suspicious nodules .  Insurance no longer covering yearly scan .  Can discuss if she wants to pay out of pocket expense $150-200.

## 2023-05-17 NOTE — Patient Instructions (Addendum)
Allegra 180mg  daily As needed   Chlorpheniramine 4mg  (Chlortab) At bedtime  As needed  Drainage  Saline nasal spray Twice daily   Saline nasal gel At bedtime  .  Flonase 1 puff daily in am As needed   Astelin 1 puffs daily in pm . As needed   Continue on CPAP At bedtime  -wear each night  Keep up good work.  Change CPAP pressure to Auto CPAP 5-15cmH2O.  Stay active , healthy diet  Do not drive if sleepy  Reschedule Lung cancer CT screening  Follow up with Shaketha Jeon NP in 1 year and As needed

## 2023-05-17 NOTE — Progress Notes (Signed)
@Patient  ID: Mandy [name], female    DOB: Feb 21, 1946, 77 y.o.   MRN: [id number]  Chief Complaint  Patient presents with   Follow-up    Referring provider: Bradd Canary, MD  HPI: 77 year old female former smoker followed for obstructive sleep apnea and chronic allergic rhinitis   TEST/EVENTS :  HST 10/26/17 >> AHI 17.4, SaO2 low 79% Auto CPAP 04/15/19 to 05/14/19 >> used on 19 of 30 nights with average 5 hrs 33 min.  Average AHI 0.8 with median CPAP 7 and 95 th percentile CPAP 10 cm H2O  05/17/2023 Follow up ; OSA and AR  Patient presents for a 41-month follow-up.  Patient has underlying sleep apnea.  She is on CPAP at bedtime.  Recently got a new CPAP machine and says it is working well.  She is wearing it every single night.  Does feel that she is more rested and has increased energy.  Does not feel as tired.  CPAP download shows excellent compliance with daily average usage at 6 hours.  She is on auto CPAP 4 to 20 cm H2O.  AHI 0.5/hour.  Daily average pressure at 10.1 cm H2O.   Says overall allergy symptoms have been doing well  Patient was treated for pneumonia couple months ago.  Says that she has improved.  Follow-up chest x-ray showed improvement  Participates in the lung cancer CT screening program.  However insurance is no longer covering yearly CT scans.  Allergies  Allergen Reactions   Codeine    Pneumococcal Vaccine Polyvalent     REACTION: rash at injection site and induration.   Red Yeast Rice Extract     Leg pain    Immunization History  Administered Date(s) Administered   Fluad Quad(high Dose 65+) 07/12/2019, 07/13/2020, 06/21/2021, 07/14/2022   Hepatitis B 04/13/2008, 06/15/2008, 12/17/2008   Influenza Split 08/03/2011, 07/31/2012   Influenza, High Dose Seasonal PF 07/23/2013, 07/30/2015, 07/14/2016, 07/26/2017, 07/19/2018   Influenza,inj,Quad PF,6+ Mos 07/08/2014   PFIZER(Purple Top)SARS-COV-2 Vaccination 11/21/2019, 12/15/2019   Pneumococcal Conjugate-13  10/30/2013   Pneumococcal Polysaccharide-23 12/23/2009, 12/21/2015   Td 05/11/2010   Tdap 05/07/2022   Zoster Recombinant(Shingrix) 06/27/2019, 09/17/2019   Zoster, Live 09/23/2008    Past Medical History:  Diagnosis Date   Atrophic vaginitis 11/18/2012   Back pain    Chicken pox as a child   Cough 07/14/2016   Dermatitis 04/26/2011   GERD (gastroesophageal reflux disease) 03/20/2017   High cholesterol    HTN (hypertension) 04/13/2010   Qualifier: Diagnosis of  By: Abner Greenspan MD, Stacey     Hypertension    Measles as a child   Miscarriage 1981   Onychomycosis 04/26/2011   OSA (obstructive sleep apnea) 10/30/2017   Osteopenia 10/17/1999   Osteopenia 04/13/2010   Qualifier: Diagnosis of  By: Abner Greenspan MD, Stacey     Preventative health care 07/14/2016   Screening for malignant neoplasm of the cervix 04/26/2011   Sleep apnea    Tinea pedis 05/10/2014   Urinary frequency 03/20/2017    Tobacco History: Social History   Tobacco Use  Smoking Status Former   Current packs/day: 0.00   Average packs/day: 1 pack/day for 45.0 years (45.0 ttl pk-yrs)   Types: Cigarettes   Start date: 1968   Quit date: 2013   Years since quitting: 11.5  Smokeless Tobacco Never   Counseling given: Not Answered   Outpatient Medications Prior to Visit  Medication Sig Dispense Refill   amLODipine (NORVASC) 5 MG tablet Take 1 tablet (5  mg total) by mouth daily. 90 tablet 1   aspirin 81 MG tablet Take 81 mg by mouth daily.     azelastine (ASTELIN) 0.1 % nasal spray Place 1 spray into both nostrils daily as needed for rhinitis. Use in each nostril as directed 30 mL 5   b complex vitamins tablet Take 1 tablet by mouth daily.     calcium citrate-vitamin D (CITRACAL+D) 315-200 MG-UNIT per tablet Take 1 tablet by mouth 2 (two) times daily.     ezetimibe (ZETIA) 10 MG tablet Take 1 tablet (10 mg total) by mouth daily. 90 tablet 3   Methylsulfonylmethane (MSM PO) Take 2 capsules by mouth daily. 1,000 mg 3 x daily      multivitamin (THERAGRAN) per tablet Take 1 tablet by mouth daily.     mupirocin ointment (BACTROBAN) 2 % APPLY INTO THE NOSE AT BEDTIME AS NEEDED. 22 g 1   Nutritional Supplements (VITAMIN D BOOSTER PO) Take by mouth.     Omega-3 Fatty Acids-Vitamin E (COROMEGA PO) Take by mouth.     Probiotic Product (PROBIOTIC DAILY PO) Take by mouth daily.     triamterene-hydrochlorothiazide (DYAZIDE) 37.5-25 MG capsule Take 1 each (1 capsule total) by mouth daily. 90 capsule 1   TURMERIC PO Take 1 capsule by mouth daily.      vitamin C (ASCORBIC ACID) 500 MG tablet Take 500 mg by mouth daily.     No facility-administered medications prior to visit.     Review of Systems:   Constitutional:   No  weight loss, night sweats,  Fevers, chills, + fatigue, or  lassitude.  HEENT:   No headaches,  Difficulty swallowing,  Tooth/dental problems, or  Sore throat,                No sneezing, itching, ear ache, nasal congestion, post nasal drip,   CV:  No chest pain,  Orthopnea, PND, swelling in lower extremities, anasarca, dizziness, palpitations, syncope.   GI  No heartburn, indigestion, abdominal pain, nausea, vomiting, diarrhea, change in bowel habits, loss of appetite, bloody stools.   Resp: No shortness of breath with exertion or at rest.  No excess mucus, no productive cough,  No non-productive cough,  No coughing up of blood.  No change in color of mucus.  No wheezing.  No chest wall deformity  Skin: no rash or lesions.  GU: no dysuria, change in color of urine, no urgency or frequency.  No flank pain, no hematuria   MS:  No joint pain or swelling.  No decreased range of motion.  No back pain.    Physical Exam  BP 130/80 (BP Location: Left Arm, Cuff Size: Normal)   Pulse (!) 58   Temp (!) 97.5 F (36.4 C) (Oral)   Ht 5\' 2"  (1.575 m)   Wt 184 lb 12.8 oz (83.8 kg)   SpO2 95%   BMI 33.80 kg/m   GEN: A/Ox3; pleasant , NAD, well nourished    HEENT:  [place]/AT,  NOSE-clear, THROAT-clear, no  lesions, no postnasal drip or exudate noted.   NECK:  Supple w/ fair ROM; no JVD; normal carotid impulses w/o bruits; no thyromegaly or nodules palpated; no lymphadenopathy.    RESP  Clear  P & A; w/o, wheezes/ rales/ or rhonchi. no accessory muscle use, no dullness to percussion  CARD:  RRR, no m/r/g, no peripheral edema, pulses intact, no cyanosis or clubbing.  GI:   Soft & nt; nml bowel sounds; no organomegaly or masses  detected.   Musco: Warm bil, no deformities or joint swelling noted.   Neuro: alert, no focal deficits noted.    Skin: Warm, no lesions or rashes    Lab Results:  CBC     BNP No results found for: "BNP"  ProBNP No results found for: "PROBNP"  Imaging: DG Chest 2 View  Result Date: 05/01/2023 CLINICAL DATA:  follow-up on PNA EXAM: CHEST - 2 VIEW COMPARISON:  04/06/2023. FINDINGS: Cardiac silhouette is unremarkable. Right mid lung alveolar opacity has improved compared to the prior study. Minimal linear subsegmental atelectasis at the left base. Normal pulmonary vasculature. No pneumothorax or pleural effusion. The visualized skeletal structures are unremarkable. IMPRESSION: Right mid lung alveolar process has partially resolved. Electronically Signed   By: Layla Maw M.D.   On: 05/01/2023 09:57    Administration History     None           No data to display          No results found for: "NITRICOXIDE"      Assessment & Plan:   OSA (obstructive sleep apnea) Excellent control compliance on nocturnal CPAP.-Continue on current setting Plan  . Patient Instructions  Allegra 180mg  daily As needed   Chlorpheniramine 4mg  (Chlortab) At bedtime  As needed  Drainage  Saline nasal spray Twice daily   Saline nasal gel At bedtime  .  Flonase 1 puff daily in am As needed   Astelin 1 puffs daily in pm . As needed   Continue on CPAP At bedtime  -wear each night  Keep up good work.  Change CPAP pressure to Auto CPAP 5-15cmH2O.  Stay active  , healthy diet  Do not drive if sleepy  Reschedule Lung cancer CT screening  Follow up with Ia Leeb NP in 1 year and As needed       Chronic rhinitis No changes  Plan  Patient Instructions  Allegra 180mg  daily As needed   Chlorpheniramine 4mg  (Chlortab) At bedtime  As needed  Drainage  Saline nasal spray Twice daily   Saline nasal gel At bedtime  .  Flonase 1 puff daily in am As needed   Astelin 1 puffs daily in pm . As needed   Continue on CPAP At bedtime  -wear each night  Keep up good work.  Change CPAP pressure to Auto CPAP 5-15cmH2O.  Stay active , healthy diet  Do not drive if sleepy  Reschedule Lung cancer CT screening  Follow up with Hilary Pundt NP in 1 year and As needed       History of tobacco abuse CT chest 2023 no suspicious nodules .  Insurance no longer covering yearly scan .  Can discuss if she wants to pay out of pocket expense $150-200.      Rubye Oaks, NP 05/17/2023

## 2023-05-17 NOTE — Assessment & Plan Note (Signed)
No changes  Plan  Patient Instructions  Allegra 180mg  daily As needed   Chlorpheniramine 4mg  (Chlortab) At bedtime  As needed  Drainage  Saline nasal spray Twice daily   Saline nasal gel At bedtime  .  Flonase 1 puff daily in am As needed   Astelin 1 puffs daily in pm . As needed   Continue on CPAP At bedtime  -wear each night  Keep up good work.  Change CPAP pressure to Auto CPAP 5-15cmH2O.  Stay active , healthy diet  Do not drive if sleepy  Reschedule Lung cancer CT screening  Follow up with Zakariya Knickerbocker NP in 1 year and As needed

## 2023-05-25 NOTE — Progress Notes (Signed)
Reviewed and agree with assessment/plan.   Coralyn Helling, MD [facility] Pulmonary/Critical Care 05/25/2023, 3:46 PM Pager:  [phone]

## 2023-05-28 ENCOUNTER — Encounter: Payer: Self-pay | Admitting: Family Medicine

## 2023-05-29 ENCOUNTER — Other Ambulatory Visit: Payer: Self-pay

## 2023-05-29 DIAGNOSIS — R059 Cough, unspecified: Secondary | ICD-10-CM

## 2023-05-29 NOTE — Telephone Encounter (Signed)
Called pt was advised of CXR ordered and sent referral for  ENT.

## 2023-06-04 DIAGNOSIS — G4733 Obstructive sleep apnea (adult) (pediatric): Secondary | ICD-10-CM | POA: Diagnosis not present

## 2023-06-07 ENCOUNTER — Ambulatory Visit (HOSPITAL_BASED_OUTPATIENT_CLINIC_OR_DEPARTMENT_OTHER)
Admission: RE | Admit: 2023-06-07 | Discharge: 2023-06-07 | Disposition: A | Discharge status: Home / Self Care | Payer: Medicare HMO | Source: Ambulatory Visit | Attending: Family Medicine | Admitting: Family Medicine

## 2023-06-07 DIAGNOSIS — J189 Pneumonia, unspecified organism: Secondary | ICD-10-CM | POA: Diagnosis not present

## 2023-06-07 DIAGNOSIS — I700 Atherosclerosis of aorta: Secondary | ICD-10-CM | POA: Diagnosis not present

## 2023-06-07 DIAGNOSIS — R059 Cough, unspecified: Secondary | ICD-10-CM | POA: Insufficient documentation

## 2023-06-14 DIAGNOSIS — L821 Other seborrheic keratosis: Secondary | ICD-10-CM | POA: Diagnosis not present

## 2023-06-14 DIAGNOSIS — D1801 Hemangioma of skin and subcutaneous tissue: Secondary | ICD-10-CM | POA: Diagnosis not present

## 2023-06-14 DIAGNOSIS — L814 Other melanin hyperpigmentation: Secondary | ICD-10-CM | POA: Diagnosis not present

## 2023-06-14 DIAGNOSIS — I8312 Varicose veins of left lower extremity with inflammation: Secondary | ICD-10-CM | POA: Diagnosis not present

## 2023-06-14 DIAGNOSIS — I8311 Varicose veins of right lower extremity with inflammation: Secondary | ICD-10-CM | POA: Diagnosis not present

## 2023-06-14 DIAGNOSIS — D225 Melanocytic nevi of trunk: Secondary | ICD-10-CM | POA: Diagnosis not present

## 2023-06-14 DIAGNOSIS — L570 Actinic keratosis: Secondary | ICD-10-CM | POA: Diagnosis not present

## 2023-06-14 DIAGNOSIS — I872 Venous insufficiency (chronic) (peripheral): Secondary | ICD-10-CM | POA: Diagnosis not present

## 2023-06-14 DIAGNOSIS — D2271 Melanocytic nevi of right lower limb, including hip: Secondary | ICD-10-CM | POA: Diagnosis not present

## 2023-06-14 DIAGNOSIS — Z85828 Personal history of other malignant neoplasm of skin: Secondary | ICD-10-CM | POA: Diagnosis not present

## 2023-06-27 ENCOUNTER — Encounter: Payer: Self-pay | Admitting: Family Medicine

## 2023-07-05 DIAGNOSIS — G4733 Obstructive sleep apnea (adult) (pediatric): Secondary | ICD-10-CM | POA: Diagnosis not present

## 2023-07-26 ENCOUNTER — Ambulatory Visit: Payer: Medicare HMO | Admitting: Adult Health

## 2023-08-04 DIAGNOSIS — G4733 Obstructive sleep apnea (adult) (pediatric): Secondary | ICD-10-CM | POA: Diagnosis not present

## 2023-08-09 DIAGNOSIS — G4733 Obstructive sleep apnea (adult) (pediatric): Secondary | ICD-10-CM | POA: Diagnosis not present

## 2023-08-16 ENCOUNTER — Ambulatory Visit (INDEPENDENT_AMBULATORY_CARE_PROVIDER_SITE_OTHER): Payer: Medicare HMO

## 2023-08-16 DIAGNOSIS — Z23 Encounter for immunization: Secondary | ICD-10-CM | POA: Diagnosis not present

## 2023-08-16 NOTE — Progress Notes (Deleted)
Pt here for Flu shot, tolerated well L deltoid.  Dr. Abner Greenspan Pt

## 2023-09-04 DIAGNOSIS — G4733 Obstructive sleep apnea (adult) (pediatric): Secondary | ICD-10-CM | POA: Diagnosis not present

## 2023-10-04 DIAGNOSIS — G4733 Obstructive sleep apnea (adult) (pediatric): Secondary | ICD-10-CM | POA: Diagnosis not present

## 2023-10-26 ENCOUNTER — Other Ambulatory Visit: Payer: Self-pay | Admitting: Family Medicine

## 2023-11-04 DIAGNOSIS — G4733 Obstructive sleep apnea (adult) (pediatric): Secondary | ICD-10-CM | POA: Diagnosis not present

## 2023-11-10 DIAGNOSIS — G4733 Obstructive sleep apnea (adult) (pediatric): Secondary | ICD-10-CM | POA: Diagnosis not present

## 2023-11-14 NOTE — Assessment & Plan Note (Signed)
Encouraged DASH or MIND diet, decrease po intake and increase exercise as tolerated. Needs 7-8 hours of sleep nightly. Avoid trans fats, eat small, frequent meals every 4-5 hours with lean proteins, complex carbs and healthy fats. Minimize simple carbs, high fat foods and processed foods

## 2023-11-14 NOTE — Assessment & Plan Note (Signed)
Well controlled, no changes to meds. Encouraged heart healthy diet such as the DASH diet and exercise as tolerated.

## 2023-11-14 NOTE — Assessment & Plan Note (Signed)
Encourage heart healthy diet such as MIND or DASH diet, increase exercise, avoid trans fats, simple carbohydrates and processed foods, consider a krill or fish or flaxseed oil cap daily.

## 2023-11-14 NOTE — Assessment & Plan Note (Signed)
hgba1c acceptable, minimize simple carbs. Increase exercise as tolerated. Continue current meds

## 2023-11-14 NOTE — Assessment & Plan Note (Signed)
Supplement and monitor

## 2023-11-14 NOTE — Assessment & Plan Note (Addendum)
Patient encouraged to maintain heart healthy diet, regular exercise, adequate sleep. Consider daily probiotics. Take medications as prescribed. Labs ordered and reviewed.  Dexa 03/2022 repeat in 1-2 years MM 03/2022 repeat every 2 years Colonoscopy 2013 patient aged out of screening colonoscopy, sees a Pap patient aged out

## 2023-11-15 ENCOUNTER — Ambulatory Visit (INDEPENDENT_AMBULATORY_CARE_PROVIDER_SITE_OTHER): Payer: Medicare HMO | Admitting: Family Medicine

## 2023-11-15 ENCOUNTER — Encounter: Payer: Self-pay | Admitting: Family Medicine

## 2023-11-15 VITALS — BP 130/72 | HR 53 | Temp 97.8°F | Resp 16 | Ht 62.0 in | Wt 176.2 lb

## 2023-11-15 DIAGNOSIS — I10 Essential (primary) hypertension: Secondary | ICD-10-CM

## 2023-11-15 DIAGNOSIS — R739 Hyperglycemia, unspecified: Secondary | ICD-10-CM

## 2023-11-15 DIAGNOSIS — Z0000 Encounter for general adult medical examination without abnormal findings: Secondary | ICD-10-CM | POA: Diagnosis not present

## 2023-11-15 DIAGNOSIS — E559 Vitamin D deficiency, unspecified: Secondary | ICD-10-CM

## 2023-11-15 DIAGNOSIS — Z1231 Encounter for screening mammogram for malignant neoplasm of breast: Secondary | ICD-10-CM

## 2023-11-15 DIAGNOSIS — Z780 Asymptomatic menopausal state: Secondary | ICD-10-CM | POA: Diagnosis not present

## 2023-11-15 DIAGNOSIS — Z6832 Body mass index (BMI) 32.0-32.9, adult: Secondary | ICD-10-CM | POA: Diagnosis not present

## 2023-11-15 DIAGNOSIS — E66811 Obesity, class 1: Secondary | ICD-10-CM | POA: Diagnosis not present

## 2023-11-15 DIAGNOSIS — E782 Mixed hyperlipidemia: Secondary | ICD-10-CM | POA: Diagnosis not present

## 2023-11-15 DIAGNOSIS — E2839 Other primary ovarian failure: Secondary | ICD-10-CM | POA: Diagnosis not present

## 2023-11-15 LAB — CBC WITH DIFFERENTIAL/PLATELET
Basophils Absolute: 0.1 10*3/uL (ref 0.0–0.1)
Basophils Relative: 1.7 % (ref 0.0–3.0)
Eosinophils Absolute: 0.1 10*3/uL (ref 0.0–0.7)
Eosinophils Relative: 2.6 % (ref 0.0–5.0)
HCT: 42.4 % (ref 36.0–46.0)
Hemoglobin: 14.2 g/dL (ref 12.0–15.0)
Lymphocytes Relative: 28.3 % (ref 12.0–46.0)
Lymphs Abs: 1.4 10*3/uL (ref 0.7–4.0)
MCHC: 33.4 g/dL (ref 30.0–36.0)
MCV: 98.8 fL (ref 78.0–100.0)
Monocytes Absolute: 0.4 10*3/uL (ref 0.1–1.0)
Monocytes Relative: 9.3 % (ref 3.0–12.0)
Neutro Abs: 2.8 10*3/uL (ref 1.4–7.7)
Neutrophils Relative %: 58.1 % (ref 43.0–77.0)
Platelets: 373 10*3/uL (ref 150.0–400.0)
RBC: 4.29 Mil/uL (ref 3.87–5.11)
RDW: 14.6 % (ref 11.5–15.5)
WBC: 4.8 10*3/uL (ref 4.0–10.5)

## 2023-11-15 LAB — COMPREHENSIVE METABOLIC PANEL
ALT: 11 U/L (ref 0–35)
AST: 18 U/L (ref 0–37)
Albumin: 4 g/dL (ref 3.5–5.2)
Alkaline Phosphatase: 55 U/L (ref 39–117)
BUN: 24 mg/dL — ABNORMAL HIGH (ref 6–23)
CO2: 31 meq/L (ref 19–32)
Calcium: 9.2 mg/dL (ref 8.4–10.5)
Chloride: 100 meq/L (ref 96–112)
Creatinine, Ser: 0.77 mg/dL (ref 0.40–1.20)
GFR: 73.65 mL/min (ref >60.00)
Glucose, Bld: 80 mg/dL (ref 70–99)
Potassium: 4.3 meq/L (ref 3.5–5.1)
Sodium: 138 meq/L (ref 135–145)
Total Bilirubin: 0.7 mg/dL (ref 0.2–1.2)
Total Protein: 6.2 g/dL (ref 6.0–8.3)

## 2023-11-15 LAB — HEMOGLOBIN A1C: Hgb A1c MFr Bld: 5.6 % (ref 4.6–6.5)

## 2023-11-15 LAB — LIPID PANEL
Cholesterol: 202 mg/dL — ABNORMAL HIGH (ref 0–200)
HDL: 69.5 mg/dL (ref >39.00)
LDL Cholesterol: 118 mg/dL — ABNORMAL HIGH (ref 0–99)
NonHDL: 132.87
Total CHOL/HDL Ratio: 3
Triglycerides: 72 mg/dL (ref 0.0–149.0)
VLDL: 14.4 mg/dL (ref 0.0–40.0)

## 2023-11-15 LAB — VITAMIN D 25 HYDROXY (VIT D DEFICIENCY, FRACTURES): VITD: 75.79 ng/mL (ref 30.00–100.00)

## 2023-11-15 LAB — TSH: TSH: 2.04 u[IU]/mL (ref 0.35–5.50)

## 2023-11-15 NOTE — Patient Instructions (Addendum)
Netflix: Live to 100 the Secrets of the Blue Zones  RSV, Respiratory Syncitial Virus Vaccine, [facility] pharmacy   Prevnar 20 at office or at [facility]  Preventive Care 65 Years and Older, Female Preventive care refers to lifestyle choices and visits with your health care provider that can promote health and wellness. Preventive care visits are also called wellness exams. What can I expect for my preventive care visit? Counseling Your health care provider may ask you questions about your: Medical history, including: Past medical problems. Family medical history. Pregnancy and menstrual history. History of falls. Current health, including: Memory and ability to understand (cognition). Emotional well-being. Home life and relationship well-being. Sexual activity and sexual health. Lifestyle, including: Alcohol, nicotine or tobacco, and drug use. Access to firearms. Diet, exercise, and sleep habits. Work and work Astronomer. Sunscreen use. Safety issues such as seatbelt and bike helmet use. Physical exam Your health care provider will check your: Height and weight. These may be used to calculate your BMI (body mass index). BMI is a measurement that tells if you are at a healthy weight. Waist circumference. This measures the distance around your waistline. This measurement also tells if you are at a healthy weight and may help predict your risk of certain diseases, such as type 2 diabetes and high blood pressure. Heart rate and blood pressure. Body temperature. Skin for abnormal spots. What immunizations do I need?  Vaccines are usually given at various ages, according to a schedule. Your health care provider will recommend vaccines for you based on your age, medical history, and lifestyle or other factors, such as travel or where you work. What tests do I need? Screening Your health care provider may recommend screening tests for certain conditions. This may include: Lipid and  cholesterol levels. Hepatitis C test. Hepatitis B test. HIV (human immunodeficiency virus) test. STI (sexually transmitted infection) testing, if you are at risk. Lung cancer screening. Colorectal cancer screening. Diabetes screening. This is done by checking your blood sugar (glucose) after you have not eaten for a while (fasting). Mammogram. Talk with your health care provider about how often you should have regular mammograms. BRCA-related cancer screening. This may be done if you have a family history of breast, ovarian, tubal, or peritoneal cancers. Bone density scan. This is done to screen for osteoporosis. Talk with your health care provider about your test results, treatment options, and if necessary, the need for more tests. Follow these instructions at home: Eating and drinking  Eat a diet that includes fresh fruits and vegetables, whole grains, lean protein, and low-fat dairy products. Limit your intake of foods with high amounts of sugar, saturated fats, and salt. Take vitamin and mineral supplements as recommended by your health care provider. Do not drink alcohol if your health care provider tells you not to drink. If you drink alcohol: Limit how much you have to 0-1 drink a day. Know how much alcohol is in your drink. In the [place] one drink equals one 12 oz bottle of beer (355 mL), one 5 oz glass of wine (148 mL), or one 1 oz glass of hard liquor (44 mL). Lifestyle Brush your teeth every morning and night with fluoride toothpaste. Floss one time each day. Exercise for at least 30 minutes 5 or more days each week. Do not use any products that contain nicotine or tobacco. These products include cigarettes, chewing tobacco, and vaping devices, such as e-cigarettes. If you need help quitting, ask your health care provider. Do not use  drugs. If you are sexually active, practice safe sex. Use a condom or other form of protection in order to prevent STIs. Take aspirin only as told  by your health care provider. Make sure that you understand how much to take and what form to take. Work with your health care provider to find out whether it is safe and beneficial for you to take aspirin daily. Ask your health care provider if you need to take a cholesterol-lowering medicine (statin). Find healthy ways to manage stress, such as: Meditation, yoga, or listening to music. Journaling. Talking to a trusted person. Spending time with friends and family. Minimize exposure to UV radiation to reduce your risk of skin cancer. Safety Always wear your seat belt while driving or riding in a vehicle. Do not drive: If you have been drinking alcohol. Do not ride with someone who has been drinking. When you are tired or distracted. While texting. If you have been using any mind-altering substances or drugs. Wear a helmet and other protective equipment during sports activities. If you have firearms in your house, make sure you follow all gun safety procedures. What's next? Visit your health care provider once a year for an annual wellness visit. Ask your health care provider how often you should have your eyes and teeth checked. Stay up to date on all vaccines. This information is not intended to replace advice given to you by your health care provider. Make sure you discuss any questions you have with your health care provider. Document Revised: 03/30/2021 Document Reviewed: 03/30/2021 Elsevier Patient Education  2024 [employer].

## 2023-11-16 ENCOUNTER — Encounter: Payer: Self-pay | Admitting: Family Medicine

## 2023-11-18 ENCOUNTER — Encounter: Payer: Self-pay | Admitting: Family Medicine

## 2023-11-18 NOTE — Progress Notes (Signed)
Subjective:    Patient ID: Mandy [name], female    DOB: 05/30/1946, 78 y.o.   MRN: [id number]  Chief Complaint  Patient presents with   Annual Exam    HPI Discussed the use of AI scribe software for clinical note transcription with the patient, who gave verbal consent to proceed.  History of Present Illness   The patient, with a history of hand surgery, hypertension, and pneumonia, presents for a routine check-up. The patient reports a persistent cough, which has not significantly changed in severity or frequency. The patient's husband believes the cough worsened after the initiation of a second antihypertensive medication, which the patient has since discontinued. The patient denies any other new or worsening symptoms.  The patient also discusses a family history of dementia and lupus, expressing concern about the potential hereditary nature of these conditions. The patient's mother lived to 45 years old without significant health issues, despite not adhering to traditional health advice such as regular exercise and hydration. The patient herself reports good dietary habits but admits to overeating.  The patient also mentions a history of pneumonia and expresses interest in receiving a new pneumonia vaccine. The patient has had pneumonia two to three times in the past and is interested in preventative measures. The patient also discusses upcoming screenings, including a mammogram and a DEXA scan, and is agreeable to these procedures.        Past Medical History:  Diagnosis Date   Atrophic vaginitis 11/18/2012   Back pain    Chicken pox as a child   Cough 07/14/2016   Dermatitis 04/26/2011   GERD (gastroesophageal reflux disease) 03/20/2017   High cholesterol    HTN (hypertension) 04/13/2010   Qualifier: Diagnosis of  By: Abner Greenspan MD, Tanith Dagostino     Hypertension    Measles as a child   Miscarriage 1981   Onychomycosis 04/26/2011   OSA (obstructive sleep apnea) 10/30/2017   Osteopenia 10/17/1999    Osteopenia 04/13/2010   Qualifier: Diagnosis of  By: Abner Greenspan MD, Nicklas Mcsweeney     Preventative health care 07/14/2016   Screening for malignant neoplasm of the cervix 04/26/2011   Sleep apnea    Tinea pedis 05/10/2014   Urinary frequency 03/20/2017    Past Surgical History:  Procedure Laterality Date   cataracts Bilateral 2023   DILATION AND CURETTAGE OF UTERUS  10/17/1979   miscarriage    Family History  Problem Relation Age of Onset   Osteoporosis Mother    Coronary artery disease Mother    Heart attack Mother 55   Hypertension Mother    Dementia Mother    Hyperlipidemia Mother    Stroke Mother    Obesity Mother    Diabetes Father        type 2   Heart disease Father    Stroke Father    Obesity Father    Dementia Brother        Lewy Body   Parkinson's disease Brother    Birth defects Maternal Aunt    Dementia Maternal Grandmother    Asthma Maternal Grandfather    Cancer Paternal Grandmother        ovarian   Dementia Paternal Grandmother    Diabetes Paternal Grandfather        tyoe 2   Hearing loss Paternal Grandfather    Arthritis Other    Hyperlipidemia Other    Hypertension Other    Other Other        Cardiovascular disorder  Social History   Socioeconomic History   Marital status: Married    Spouse name: Thayer Ohm   Number of children: Not on file   Years of education: Not on file   Highest education level: Bachelor's degree (e.g., BA, AB, BS)  Occupational History   Occupation: bookkeeper  Tobacco Use   Smoking status: Former    Current packs/day: 0.00    Average packs/day: 1 pack/day for 45.0 years (45.0 ttl pk-yrs)    Types: Cigarettes    Start date: 69    Quit date: 2013    Years since quitting: 12.0   Smokeless tobacco: Never  Vaping Use   Vaping status: Never Used  Substance and Sexual Activity   Alcohol use: No   Drug use: No   Sexual activity: Yes    Partners: Male  Other Topics Concern   Not on file  Social History Narrative   Regular  exercise- yes   Does Yoga 3-4 X week, personal trainer 1 X week   and walks regularly   Has lost 16 pounds by minimizing processed foods and meats   Social Drivers of Health   Financial Resource Strain: Low Risk  (04/12/2023)   Overall Financial Resource Strain (CARDIA)    Difficulty of Paying Living Expenses: Not hard at all  Food Insecurity: No Food Insecurity (04/12/2023)   Hunger Vital Sign    Worried About Running Out of Food in the Last Year: Never true    Ran Out of Food in the Last Year: Never true  Transportation Needs: No Transportation Needs (04/12/2023)   PRAPARE - Administrator, Civil Service (Medical): No    Lack of Transportation (Non-Medical): No  Physical Activity: Sufficiently Active (04/12/2023)   Exercise Vital Sign    Days of Exercise per Week: 5 days    Minutes of Exercise per Session: 30 min  Stress: No Stress Concern Present (04/12/2023)   [employer] of Occupational Health - Occupational Stress Questionnaire    Feeling of Stress : Not at all  Social Connections: Socially Integrated (04/12/2023)   Social Connection and Isolation Panel [NHANES]    Frequency of Communication with Friends and Family: Three times a week    Frequency of Social Gatherings with Friends and Family: Twice a week    Attends Religious Services: 1 to 4 times per year    Active Member of [employer] or Organizations: Yes    Attends Banker Meetings: 1 to 4 times per year    Marital Status: Married  Catering manager Violence: Not on file    Outpatient Medications Prior to Visit  Medication Sig Dispense Refill   aspirin 81 MG tablet Take 81 mg by mouth daily.     azelastine (ASTELIN) 0.1 % nasal spray Place 1 spray into both nostrils daily as needed for rhinitis. Use in each nostril as directed 30 mL 5   b complex vitamins tablet Take 1 tablet by mouth daily.     calcium citrate-vitamin D (CITRACAL+D) 315-200 MG-UNIT per tablet Take 1 tablet by mouth 2 (two) times  daily.     ezetimibe (ZETIA) 10 MG tablet Take 1 tablet (10 mg total) by mouth daily. 90 tablet 3   Methylsulfonylmethane (MSM PO) Take 2 capsules by mouth daily. 1,000 mg 3 x daily     multivitamin (THERAGRAN) per tablet Take 1 tablet by mouth daily.     mupirocin ointment (BACTROBAN) 2 % APPLY INTO THE NOSE AT BEDTIME AS NEEDED. 22 g  1   Nutritional Supplements (VITAMIN D BOOSTER PO) Take by mouth.     Omega-3 Fatty Acids-Vitamin E (COROMEGA PO) Take by mouth.     Probiotic Product (PROBIOTIC DAILY PO) Take by mouth daily.     triamterene-hydrochlorothiazide (DYAZIDE) 37.5-25 MG capsule Take 1 each (1 capsule total) by mouth daily. 90 capsule 1   TURMERIC PO Take 1 capsule by mouth daily.      vitamin C (ASCORBIC ACID) 500 MG tablet Take 500 mg by mouth daily.     amLODipine (NORVASC) 5 MG tablet Take 1 tablet (5 mg total) by mouth daily. 90 tablet 1   No facility-administered medications prior to visit.    Allergies  Allergen Reactions   Codeine    Pneumococcal Vaccine Polyvalent     REACTION: rash at injection site and induration.   Red Yeast Rice Extract     Leg pain    Review of Systems  Constitutional:  Negative for chills, fever and malaise/fatigue.  HENT:  Negative for congestion and hearing loss.   Eyes:  Negative for discharge.  Respiratory:  Negative for cough, sputum production and shortness of breath.   Cardiovascular:  Negative for chest pain, palpitations and leg swelling.  Gastrointestinal:  Negative for abdominal pain, blood in stool, constipation, diarrhea, heartburn, nausea and vomiting.  Genitourinary:  Negative for dysuria, frequency, hematuria and urgency.  Musculoskeletal:  Negative for back pain, falls and myalgias.  Skin:  Negative for rash.  Neurological:  Negative for dizziness, sensory change, loss of consciousness, weakness and headaches.  Endo/Heme/Allergies:  Negative for environmental allergies. Does not bruise/bleed easily.   Psychiatric/Behavioral:  Negative for depression and suicidal ideas. The patient is not nervous/anxious and does not have insomnia.        Objective:    Physical Exam Constitutional:      General: She is not in acute distress.    Appearance: Normal appearance. She is not diaphoretic.  HENT:     Head: Normocephalic and atraumatic.     Right Ear: Tympanic membrane, ear canal and external ear normal.     Left Ear: Tympanic membrane, ear canal and external ear normal.     Nose: Nose normal.     Mouth/Throat:     Mouth: Mucous membranes are moist.     Pharynx: Oropharynx is clear. No oropharyngeal exudate.  Eyes:     General: No scleral icterus.       Right eye: No discharge.        Left eye: No discharge.     Conjunctiva/sclera: Conjunctivae normal.     Pupils: Pupils are equal, round, and reactive to light.  Neck:     Thyroid: No thyromegaly.  Cardiovascular:     Rate and Rhythm: Normal rate and regular rhythm.     Heart sounds: Normal heart sounds. No murmur heard. Pulmonary:     Effort: Pulmonary effort is normal. No respiratory distress.     Breath sounds: Normal breath sounds. No wheezing or rales.  Abdominal:     General: Bowel sounds are normal. There is no distension.     Palpations: Abdomen is soft. There is no mass.     Tenderness: There is no abdominal tenderness.  Musculoskeletal:        General: No tenderness. Normal range of motion.     Cervical back: Normal range of motion and neck supple.  Lymphadenopathy:     Cervical: No cervical adenopathy.  Skin:    General: Skin is warm  and dry.     Findings: No rash.  Neurological:     General: No focal deficit present.     Mental Status: She is alert and oriented to person, place, and time.     Cranial Nerves: No cranial nerve deficit.     Coordination: Coordination normal.     Deep Tendon Reflexes: Reflexes are normal and symmetric. Reflexes normal.  Psychiatric:        Mood and Affect: Mood normal.         Behavior: Behavior normal.        Thought Content: Thought content normal.        Judgment: Judgment normal.     BP 130/72 (BP Location: Left Arm, Patient Position: Sitting, Cuff Size: Large)   Pulse (!) 53   Temp 97.8 F (36.6 C) (Oral)   Resp 16   Ht 5\' 2"  (1.575 m)   Wt 176 lb 3.2 oz (79.9 kg)   SpO2 97%   BMI 32.23 kg/m  Wt Readings from Last 3 Encounters:  11/15/23 176 lb 3.2 oz (79.9 kg)  05/17/23 184 lb 12.8 oz (83.8 kg)  04/30/23 184 lb (83.5 kg)    Diabetic Foot Exam - Simple   No data filed    Lab Results  Component Value Date   WBC 4.8 11/15/2023   HGB 14.2 11/15/2023   HCT 42.4 11/15/2023   PLT 373.0 11/15/2023   GLUCOSE 80 11/15/2023   CHOL 202 (H) 11/15/2023   TRIG 72.0 11/15/2023   HDL 69.50 11/15/2023   LDLDIRECT 113.4 11/12/2012   LDLCALC 118 (H) 11/15/2023   ALT 11 11/15/2023   AST 18 11/15/2023   NA 138 11/15/2023   K 4.3 11/15/2023   CL 100 11/15/2023   CREATININE 0.77 11/15/2023   BUN 24 (H) 11/15/2023   CO2 31 11/15/2023   TSH 2.04 11/15/2023   HGBA1C 5.6 11/15/2023    Lab Results  Component Value Date   TSH 2.04 11/15/2023   Lab Results  Component Value Date   WBC 4.8 11/15/2023   HGB 14.2 11/15/2023   HCT 42.4 11/15/2023   MCV 98.8 11/15/2023   PLT 373.0 11/15/2023   Lab Results  Component Value Date   NA 138 11/15/2023   K 4.3 11/15/2023   CO2 31 11/15/2023   GLUCOSE 80 11/15/2023   BUN 24 (H) 11/15/2023   CREATININE 0.77 11/15/2023   BILITOT 0.7 11/15/2023   ALKPHOS 55 11/15/2023   AST 18 11/15/2023   ALT 11 11/15/2023   PROT 6.2 11/15/2023   ALBUMIN 4.0 11/15/2023   CALCIUM 9.2 11/15/2023   EGFR 77 06/08/2021   GFR 73.65 11/15/2023   Lab Results  Component Value Date   CHOL 202 (H) 11/15/2023   Lab Results  Component Value Date   HDL 69.50 11/15/2023   Lab Results  Component Value Date   LDLCALC 118 (H) 11/15/2023   Lab Results  Component Value Date   TRIG 72.0 11/15/2023   Lab Results   Component Value Date   CHOLHDL 3 11/15/2023   Lab Results  Component Value Date   HGBA1C 5.6 11/15/2023       Assessment & Plan:  Class 1 obesity with serious comorbidity and body mass index (BMI) of 32.0 to 32.9 in adult, unspecified obesity type Assessment & Plan: Encouraged DASH or MIND diet, decrease po intake and increase exercise as tolerated. Needs 7-8 hours of sleep nightly. Avoid trans fats, eat small, frequent meals every 4-5 hours  with lean proteins, complex carbs and healthy fats. Minimize simple carbs, high fat foods and processed foods    Essential hypertension Assessment & Plan: Well controlled, no changes to meds. Encouraged heart healthy diet such as the DASH diet and exercise as tolerated.   Orders: -     CBC with Differential/Platelet; Future -     Comprehensive metabolic panel; Future -     TSH; Future  Hyperglycemia Assessment & Plan: hgba1c acceptable, minimize simple carbs. Increase exercise as tolerated. Continue current meds  Orders: -     Hemoglobin A1c; Future  Mixed hyperlipidemia Assessment & Plan: Encourage heart healthy diet such as MIND or DASH diet, increase exercise, avoid trans fats, simple carbohydrates and processed foods, consider a krill or fish or flaxseed oil cap daily.   Orders: -     Lipid panel; Future  Preventative health care Assessment & Plan: Patient encouraged to maintain heart healthy diet, regular exercise, adequate sleep. Consider daily probiotics. Take medications as prescribed. Labs ordered and reviewed.  Dexa 03/2022 repeat in 1-2 years MM 03/2022 repeat every 2 years Colonoscopy 2013 patient aged out of screening colonoscopy, sees a Pap patient aged out   Vitamin D deficiency Assessment & Plan: Supplement and monitor  Orders: -     VITAMIN D 25 Hydroxy (Vit-D Deficiency, Fractures); Future  Encounter for screening mammogram for malignant neoplasm of breast -     3D Screening Mammogram, Left and Right;  Future  Post-menopausal -     DG Bone Density; Future  Estrogen deficiency -     DG Bone Density; Future    Assessment and Plan    Post-operative hand surgery Recovery from hand surgery last year. No current numbness or stiffness. -No further intervention required at this time.  Hypertension Patient stopped taking amlodipine due to perceived worsening of cough. Blood pressure controlled on triamterene HCTZ. -Continue triamterene HCTZ. -Remove amlodipine from medication list.  Chronic cough Persistent cough not worsening. No other associated symptoms. -Continue monitoring. No further intervention required at this time.  Ear discomfort Occasional piercing pain in the ear. Wax observed in the ear canal, not obstructing and TM unremarkable -Advise patient to use peroxide in the ear every 2-3 weeks to manage wax build-up.  General Health Maintenance / Followup Plans -Mammogram due in June 2025. -DEXA scan due in June 2025. -Consider Arexvy (RSV) vaccine. -Consider Prevnar 20 pneumonia vaccine booster. -Annual wellness visit recommended. -Check blood work in 6 months. -Continue hydration and exercise for overall health maintenance.         Danise Edge, MD

## 2023-11-23 ENCOUNTER — Encounter: Payer: Self-pay | Admitting: *Deleted

## 2023-12-05 DIAGNOSIS — G4733 Obstructive sleep apnea (adult) (pediatric): Secondary | ICD-10-CM | POA: Diagnosis not present

## 2023-12-13 ENCOUNTER — Telehealth: Payer: Self-pay | Admitting: *Deleted

## 2023-12-13 NOTE — Telephone Encounter (Signed)
 [name], Virgel Bouquet, NP routed this conversation to Me [name], Virgel Bouquet, NP to [name]      12/08/23  4:20 PM [name], Sorry to hear, this I am going to get my nurse to get a downloaur CPAP so I can make sure it is taking care of your sleep apnea. I can always make some adjustments to the CPAP If you think the pressure is too high or the mask is leaking.  Typically insomnia I try to look at what might be causing it , ie stress , etc.  Helpful sleep tips are to go to bed each night at same time and get up same time, Keep time in bed <7-8 hr . No daytime naps. Limit caffeine and screen time. Increase activity during daytime. Stress relievers , stretching in the evenings.    I will reach back out once I get more information.    Kindly  Mandy [name]   Last read by Mandy [name] at  5:01 PM on 12/08/2023. December 06, 2023      12/06/23  3:54 PM Mandy [name], CMA routed this conversation to [name], Virgel Bouquet, NP Mandy [name] to [facility] Pool (supporting Julio Sicks, NP)      12/06/23  3:22 PM Hi Mandy, I wanted to to tell you about my sleep pattern lately to see if you have a solution. I sleep in shifts. First shift is 3-4 hours, then I'm up for 1-2 hrs, and then I sleep another shift of 1-3 ish hours.  Often I get a third shift of several hours. I generally don't sleep during the day. My cpap scores are still in the 80 to low 90's.  Any thoughts??? Thanks, Mandy [name] 2045/01/[birth year] This encounter is not signed. The conversation may still be ongoing.

## 2023-12-14 NOTE — Telephone Encounter (Signed)
 Download printed and placed on Mandy [name]'s desk for review.

## 2023-12-17 NOTE — Telephone Encounter (Signed)
 CPAP download shows excellent compliance with daily average usage at 6 hours and your sleep apnea control is also excellent Would continue on current settings.  Wanted to see if any your sleep had gotten any bit better.

## 2023-12-17 NOTE — Telephone Encounter (Signed)
 DL looks great, no issues with CPAP  Great control and usage .  Is she still having sleep issues.

## 2023-12-18 ENCOUNTER — Encounter: Payer: Self-pay | Admitting: *Deleted

## 2023-12-20 ENCOUNTER — Encounter: Payer: Self-pay | Admitting: *Deleted

## 2024-01-02 DIAGNOSIS — G4733 Obstructive sleep apnea (adult) (pediatric): Secondary | ICD-10-CM | POA: Diagnosis not present

## 2024-01-07 ENCOUNTER — Encounter: Payer: Self-pay | Admitting: Emergency Medicine

## 2024-02-02 DIAGNOSIS — G4733 Obstructive sleep apnea (adult) (pediatric): Secondary | ICD-10-CM | POA: Diagnosis not present

## 2024-02-04 ENCOUNTER — Encounter: Payer: Self-pay | Admitting: Family Medicine

## 2024-02-04 ENCOUNTER — Other Ambulatory Visit: Payer: Self-pay | Admitting: Family Medicine

## 2024-02-04 DIAGNOSIS — R059 Cough, unspecified: Secondary | ICD-10-CM

## 2024-02-09 DIAGNOSIS — G4733 Obstructive sleep apnea (adult) (pediatric): Secondary | ICD-10-CM | POA: Diagnosis not present

## 2024-02-11 ENCOUNTER — Encounter (INDEPENDENT_AMBULATORY_CARE_PROVIDER_SITE_OTHER): Payer: Self-pay | Admitting: Otolaryngology

## 2024-02-12 DIAGNOSIS — L720 Epidermal cyst: Secondary | ICD-10-CM | POA: Diagnosis not present

## 2024-02-12 DIAGNOSIS — D0439 Carcinoma in situ of skin of other parts of face: Secondary | ICD-10-CM | POA: Diagnosis not present

## 2024-02-12 DIAGNOSIS — L739 Follicular disorder, unspecified: Secondary | ICD-10-CM | POA: Diagnosis not present

## 2024-02-12 DIAGNOSIS — D040 Carcinoma in situ of skin of lip: Secondary | ICD-10-CM | POA: Diagnosis not present

## 2024-02-12 DIAGNOSIS — L570 Actinic keratosis: Secondary | ICD-10-CM | POA: Diagnosis not present

## 2024-02-12 DIAGNOSIS — D485 Neoplasm of uncertain behavior of skin: Secondary | ICD-10-CM | POA: Diagnosis not present

## 2024-03-03 DIAGNOSIS — G4733 Obstructive sleep apnea (adult) (pediatric): Secondary | ICD-10-CM | POA: Diagnosis not present

## 2024-03-13 DIAGNOSIS — S0501XA Injury of conjunctiva and corneal abrasion without foreign body, right eye, initial encounter: Secondary | ICD-10-CM | POA: Diagnosis not present

## 2024-03-13 DIAGNOSIS — H2101 Hyphema, right eye: Secondary | ICD-10-CM | POA: Diagnosis not present

## 2024-03-14 DIAGNOSIS — H2101 Hyphema, right eye: Secondary | ICD-10-CM | POA: Diagnosis not present

## 2024-03-15 DIAGNOSIS — H2101 Hyphema, right eye: Secondary | ICD-10-CM | POA: Diagnosis not present

## 2024-03-17 DIAGNOSIS — H2101 Hyphema, right eye: Secondary | ICD-10-CM | POA: Diagnosis not present

## 2024-03-19 DIAGNOSIS — H2101 Hyphema, right eye: Secondary | ICD-10-CM | POA: Diagnosis not present

## 2024-03-25 DIAGNOSIS — H2101 Hyphema, right eye: Secondary | ICD-10-CM | POA: Diagnosis not present

## 2024-04-03 DIAGNOSIS — G4730 Sleep apnea, unspecified: Secondary | ICD-10-CM | POA: Diagnosis not present

## 2024-04-03 DIAGNOSIS — E669 Obesity, unspecified: Secondary | ICD-10-CM | POA: Diagnosis not present

## 2024-04-03 DIAGNOSIS — G4700 Insomnia, unspecified: Secondary | ICD-10-CM | POA: Diagnosis not present

## 2024-04-03 DIAGNOSIS — E785 Hyperlipidemia, unspecified: Secondary | ICD-10-CM | POA: Diagnosis not present

## 2024-04-03 DIAGNOSIS — K5909 Other constipation: Secondary | ICD-10-CM | POA: Diagnosis not present

## 2024-04-03 DIAGNOSIS — I10 Essential (primary) hypertension: Secondary | ICD-10-CM | POA: Diagnosis not present

## 2024-04-08 ENCOUNTER — Ambulatory Visit (HOSPITAL_BASED_OUTPATIENT_CLINIC_OR_DEPARTMENT_OTHER): Payer: Medicare HMO

## 2024-04-08 ENCOUNTER — Other Ambulatory Visit (HOSPITAL_BASED_OUTPATIENT_CLINIC_OR_DEPARTMENT_OTHER): Payer: Medicare HMO

## 2024-04-13 NOTE — Assessment & Plan Note (Signed)
 Encourage heart healthy diet such as MIND or DASH diet, increase exercise, avoid trans fats, simple carbohydrates and processed foods, consider a krill or fish or flaxseed oil cap daily.

## 2024-04-13 NOTE — Assessment & Plan Note (Signed)
 hgba1c acceptable, minimize simple carbs. Increase exercise as tolerated. Continue current meds

## 2024-04-13 NOTE — Assessment & Plan Note (Signed)
 Supplement and monitor

## 2024-04-13 NOTE — Assessment & Plan Note (Signed)
 Well controlled, no changes to meds. Encouraged heart healthy diet such as the DASH diet and exercise as tolerated.

## 2024-04-16 ENCOUNTER — Encounter (INDEPENDENT_AMBULATORY_CARE_PROVIDER_SITE_OTHER): Payer: Self-pay | Admitting: Otolaryngology

## 2024-04-16 ENCOUNTER — Ambulatory Visit (INDEPENDENT_AMBULATORY_CARE_PROVIDER_SITE_OTHER): Admitting: Otolaryngology

## 2024-04-16 VITALS — BP 135/86 | HR 66

## 2024-04-16 DIAGNOSIS — J343 Hypertrophy of nasal turbinates: Secondary | ICD-10-CM

## 2024-04-16 DIAGNOSIS — K219 Gastro-esophageal reflux disease without esophagitis: Secondary | ICD-10-CM

## 2024-04-16 DIAGNOSIS — J342 Deviated nasal septum: Secondary | ICD-10-CM | POA: Diagnosis not present

## 2024-04-16 DIAGNOSIS — R053 Chronic cough: Secondary | ICD-10-CM | POA: Diagnosis not present

## 2024-04-16 DIAGNOSIS — J3089 Other allergic rhinitis: Secondary | ICD-10-CM | POA: Diagnosis not present

## 2024-04-16 NOTE — Progress Notes (Signed)
 ENT CONSULT:  Reason for Consult: chronic cough    HPI: Discussed the use of AI scribe software for clinical note transcription with the patient, who gave verbal consent to proceed.  History of Present Illness Mandy [name] is a 78 year old female who presents with a chronic cough for several months.  She has been experiencing a chronic dry cough for several months, with fluctuating severity and no specific pattern throughout the day. There is no history of asthma, lung disease, heartburn, or reflux, and she has not used inhalers for this condition. Her past medical history includes a previous episode of pneumonia on the right side in 2024, with no specific explanation provided for the pneumonia. She reports no difficulty swallowing.  About five or six years ago, an ENT specialist suspected acid reflux but did not initiate any treatment. She occasionally feels as though something is stuck in her throat, which she associates with the cough.  Her medication history includes changes to her blood pressure medication and discontinuation of a statin, which previously resolved her cough. She currently does not take any medication for reflux and has not experienced heartburn. She experiences postnasal drainage at certain times of the year but not currently. She uses a CPAP machine and has an upcoming appointment with a pulmonary specialist.   Records Reviewed:  Pulm visit 05/17/23 for OSA  HST 10/26/17 >> AHI 17.4, SaO2 low 79% Auto CPAP 04/15/19 to 05/14/19 >> used on 19 of 30 nights with average 5 hrs 33 min.  Average AHI 0.8 with median CPAP 7 and 95 th percentile CPAP 10 cm H2O   05/17/2023 Follow up ; OSA and AR  Patient presents for a 88-month follow-up.  Patient has underlying sleep apnea.  She is on CPAP at bedtime.  Recently got a new CPAP machine and says it is working well.  She is wearing it every single night.  Does feel that she is more rested and has increased energy.  Does not feel as tired.   CPAP download shows excellent compliance with daily average usage at 6 hours.  She is on auto CPAP 4 to 20 cm H2O.  AHI 0.5/hour.  Daily average pressure at 10.1 cm H2O.    Says overall allergy symptoms have been doing well   Patient was treated for pneumonia couple months ago.  Says that she has improved.  Follow-up chest x-ray showed improvement   Participates in the lung cancer CT screening program.  However insurance is no longer covering yearly CT scans.   Past Medical History:  Diagnosis Date   Atrophic vaginitis 11/18/2012   Back pain    Chicken pox as a child   Cough 07/14/2016   Dermatitis 04/26/2011   GERD (gastroesophageal reflux disease) 03/20/2017   High cholesterol    HTN (hypertension) 04/13/2010   Qualifier: Diagnosis of  By: Domenica MD, Stacey     Hypertension    Measles as a child   Miscarriage 1981   Onychomycosis 04/26/2011   OSA (obstructive sleep apnea) 10/30/2017   Osteopenia 10/17/1999   Osteopenia 04/13/2010   Qualifier: Diagnosis of  By: Domenica MD, Stacey     Preventative health care 07/14/2016   Screening for malignant neoplasm of the cervix 04/26/2011   Sleep apnea    Tinea pedis 05/10/2014   Urinary frequency 03/20/2017    Past Surgical History:  Procedure Laterality Date   cataracts Bilateral 2023   DILATION AND CURETTAGE OF UTERUS  10/17/1979   miscarriage  Family History  Problem Relation Age of Onset   Osteoporosis Mother    Coronary artery disease Mother    Heart attack Mother 61   Hypertension Mother    Dementia Mother    Hyperlipidemia Mother    Stroke Mother    Obesity Mother    Diabetes Father        type 2   Heart disease Father    Stroke Father    Obesity Father    Dementia Brother        Lewy Body   Parkinson's disease Brother    Birth defects Maternal Aunt    Dementia Maternal Grandmother    Asthma Maternal Grandfather    Cancer Paternal Grandmother        ovarian   Dementia Paternal Grandmother    Diabetes Paternal Grandfather         tyoe 2   Hearing loss Paternal Grandfather    Arthritis Other    Hyperlipidemia Other    Hypertension Other    Other Other        Cardiovascular disorder    Social History:  reports that she quit smoking about 12 years ago. Her smoking use included cigarettes. She started smoking about 57 years ago. She has a 45 pack-year smoking history. She has never used smokeless tobacco. She reports that she does not drink alcohol and does not use drugs.  Allergies:  Allergies  Allergen Reactions   Codeine    Pneumococcal Vaccine Polyvalent     REACTION: rash at injection site and induration.   Red Yeast Rice Extract     Leg pain    Medications: I have reviewed the patient's current medications.  The PMH, PSH, Medications, Allergies, and SH were reviewed and updated.  ROS: Constitutional: Negative for fever, weight loss and weight gain. Cardiovascular: Negative for chest pain and dyspnea on exertion. Respiratory: Is not experiencing shortness of breath at rest. Gastrointestinal: Negative for nausea and vomiting. Neurological: Negative for headaches. Psychiatric: The patient is not nervous/anxious  Blood pressure 135/86, pulse 66, SpO2 94%. There is no height or weight on file to calculate BMI.  PHYSICAL EXAM:  Exam: General: Well-developed, well-nourished Respiratory Respiratory effort: Equal inspiration and expiration without stridor Cardiovascular Peripheral Vascular: Warm extremities with equal color/perfusion Eyes: No nystagmus with equal extraocular motion bilaterally Neuro/Psych/Balance: Patient oriented to person, place, and time; Appropriate mood and affect; Gait is intact with no imbalance; Cranial nerves I-XII are intact Head and Face Inspection: Normocephalic and atraumatic without mass or lesion Palpation: Facial skeleton intact without bony stepoffs Salivary Glands: No mass or tenderness Facial Strength: Facial motility symmetric and full  bilaterally ENT Pinna: External ear intact and fully developed External canal: Canal is patent with intact skin Tympanic Membrane: Clear and mobile External Nose: No scar or anatomic deformity Internal Nose: Septum is deviated to the left. No polyp, or purulence. Mucosal edema and erythema present.  Bilateral inferior turbinate hypertrophy.  Lips, Teeth, and gums: Mucosa and teeth intact and viable TMJ: No pain to palpation with full mobility Oral cavity/oropharynx: No erythema or exudate, no lesions present Nasopharynx: No mass or lesion with intact mucosa Hypopharynx: Intact mucosa without pooling of secretions Larynx Glottic: Full true vocal cord mobility without lesion or mass Supraglottic: Normal appearing epiglottis and AE folds Interarytenoid Space: Moderate pachydermia&edema Subglottic Space: Patent without lesion or edema Neck Neck and Trachea: Midline trachea without mass or lesion Thyroid : No mass or nodularity Lymphatics: No lymphadenopathy  Procedure: Preoperative diagnosis: chronic  cough   Postoperative diagnosis:   Same + GERD LPR  Procedure: Flexible fiberoptic laryngoscopy  Surgeon: Janara Klett, MD  Anesthesia: Topical lidocaine and Afrin Complications: None Condition is stable throughout exam  Indications and consent:  The patient presents to the clinic with above symptoms. Indirect laryngoscopy view was incomplete. Thus it was recommended that they undergo a flexible fiberoptic laryngoscopy. All of the risks, benefits, and potential complications were reviewed with the patient preoperatively and verbal informed consent was obtained.  Procedure: The patient was seated upright in the clinic. Topical lidocaine and Afrin were applied to the nasal cavity. After adequate anesthesia had occurred, I then proceeded to pass the flexible telescope into the nasal cavity. The nasal cavity was patent without rhinorrhea or polyp. The nasopharynx was also patent without  mass or lesion. The base of tongue was visualized and was normal. There were no signs of pooling of secretions in the piriform sinuses. The true vocal folds were mobile bilaterally. There were no signs of glottic or supraglottic mucosal lesion or mass. There was moderate interarytenoid pachydermia and post cricoid edema. The telescope was then slowly withdrawn and the patient tolerated the procedure throughout.    Studies Reviewed: CXR 06/07/23 FINDINGS: Cardiac silhouette is unremarkable. No pneumothorax or pleural effusion. Right lower lobe pneumonia has nearly resolved. Small residual opacity identified right mid lung. Left base subsegmental atelectasis or scarring. Calcified aorta. Normal pulmonary vasculature. The visualized skeletal structures are unremarkable.   IMPRESSION: Right mid lung pneumonia has nearly completely resolved.  Assessment/Plan: Encounter Diagnoses  Name Primary?   Chronic cough Yes   Chronic GERD    Environmental and seasonal allergies    Hypertrophy of both inferior nasal turbinates    Nasal septal deviation     Assessment and Plan Assessment & Plan Chronic cough Chronic dry cough for several months. Previously had cough which resolved after changes in her BP medication and statin. Silent reflux considered as a potential cause. Hx of R sided PNA in 2024, denies dysphagia sx. She denies post-nasal drainage currently.  I discussed common etiologies of chronic cough including GERD/allergies and PND and lung disease with the patient and explained that in most cases the cause of chronic cough is multi-factorial - Recommend Reflux Gourmet after meals, preferably at night. - Provide written instructions on lifestyle modifications for reflux control. - Order chest x-ray to evaluate (no recent imaging) - Coordinate with pulmonary specialist during upcoming visit to be evaluated for pulmonary causes of chronic cough and have PFTs  CXR and RTC in 3 mo  Thank you  for allowing me to participate in the care of this patient. Please do not hesitate to contact me with any questions or concerns.   Elena Larry, MD Otolaryngology [facility] Phone: [phone] Fax: [phone]    04/16/2024, 2:50 PM

## 2024-04-16 NOTE — Patient Instructions (Signed)

## 2024-04-17 ENCOUNTER — Other Ambulatory Visit: Payer: Self-pay | Admitting: Family Medicine

## 2024-04-17 ENCOUNTER — Ambulatory Visit (INDEPENDENT_AMBULATORY_CARE_PROVIDER_SITE_OTHER): Admitting: Family Medicine

## 2024-04-17 ENCOUNTER — Encounter: Payer: Self-pay | Admitting: Family Medicine

## 2024-04-17 VITALS — BP 128/70 | HR 60 | Resp 16 | Ht 62.0 in | Wt 182.4 lb

## 2024-04-17 DIAGNOSIS — E559 Vitamin D deficiency, unspecified: Secondary | ICD-10-CM | POA: Diagnosis not present

## 2024-04-17 DIAGNOSIS — I10 Essential (primary) hypertension: Secondary | ICD-10-CM | POA: Diagnosis not present

## 2024-04-17 DIAGNOSIS — H2101 Hyphema, right eye: Secondary | ICD-10-CM | POA: Diagnosis not present

## 2024-04-17 DIAGNOSIS — E782 Mixed hyperlipidemia: Secondary | ICD-10-CM | POA: Diagnosis not present

## 2024-04-17 DIAGNOSIS — R739 Hyperglycemia, unspecified: Secondary | ICD-10-CM

## 2024-04-17 LAB — CBC WITH DIFFERENTIAL/PLATELET
Basophils Absolute: 0.1 10*3/uL (ref 0.0–0.1)
Basophils Relative: 1.2 % (ref 0.0–3.0)
Eosinophils Absolute: 0.1 10*3/uL (ref 0.0–0.7)
Eosinophils Relative: 1.3 % (ref 0.0–5.0)
HCT: 39.4 % (ref 36.0–46.0)
Hemoglobin: 13.4 g/dL (ref 12.0–15.0)
Lymphocytes Relative: 25.9 % (ref 12.0–46.0)
Lymphs Abs: 1.4 10*3/uL (ref 0.7–4.0)
MCHC: 34.1 g/dL (ref 30.0–36.0)
MCV: 96.9 fl (ref 78.0–100.0)
Monocytes Absolute: 0.5 10*3/uL (ref 0.1–1.0)
Monocytes Relative: 9.5 % (ref 3.0–12.0)
Neutro Abs: 3.4 10*3/uL (ref 1.4–7.7)
Neutrophils Relative %: 62.1 % (ref 43.0–77.0)
Platelets: 354 10*3/uL (ref 150.0–400.0)
RBC: 4.07 Mil/uL (ref 3.87–5.11)
RDW: 14.3 % (ref 11.5–15.5)
WBC: 5.6 10*3/uL (ref 4.0–10.5)

## 2024-04-17 LAB — COMPREHENSIVE METABOLIC PANEL WITH GFR
ALT: 12 U/L (ref 0–35)
AST: 17 U/L (ref 0–37)
Albumin: 4.2 g/dL (ref 3.5–5.2)
Alkaline Phosphatase: 50 U/L (ref 39–117)
BUN: 16 mg/dL (ref 6–23)
CO2: 32 meq/L (ref 19–32)
Calcium: 9.4 mg/dL (ref 8.4–10.5)
Chloride: 96 meq/L (ref 96–112)
Creatinine, Ser: 0.79 mg/dL (ref 0.40–1.20)
GFR: 71.21 mL/min (ref >60.00)
Glucose, Bld: 96 mg/dL (ref 70–99)
Potassium: 4.3 meq/L (ref 3.5–5.1)
Sodium: 132 meq/L — ABNORMAL LOW (ref 135–145)
Total Bilirubin: 0.7 mg/dL (ref 0.2–1.2)
Total Protein: 6.5 g/dL (ref 6.0–8.3)

## 2024-04-17 LAB — LIPID PANEL
Cholesterol: 217 mg/dL — ABNORMAL HIGH (ref 0–200)
HDL: 72.8 mg/dL (ref >39.00)
LDL Cholesterol: 132 mg/dL — ABNORMAL HIGH (ref 0–99)
NonHDL: 144.17
Total CHOL/HDL Ratio: 3
Triglycerides: 63 mg/dL (ref 0.0–149.0)
VLDL: 12.6 mg/dL (ref 0.0–40.0)

## 2024-04-17 LAB — HEMOGLOBIN A1C: Hgb A1c MFr Bld: 5.5 % (ref 4.6–6.5)

## 2024-04-17 LAB — TSH: TSH: 1.26 u[IU]/mL (ref 0.35–5.50)

## 2024-04-17 NOTE — Patient Instructions (Signed)
 CBT Insomnia APP, [employer] APP   Insomnia Insomnia is a sleep disorder that makes it difficult to fall asleep or stay asleep. Insomnia can cause fatigue, low energy, difficulty concentrating, mood swings, and poor performance at work or school. There are three different ways to classify insomnia: Difficulty falling asleep. Difficulty staying asleep. Waking up too early in the morning. Any type of insomnia can be long-term (chronic) or short-term (acute). Both are common. Short-term insomnia usually lasts for 3 months or less. Chronic insomnia occurs at least three times a week for longer than 3 months. What are the causes? Insomnia may be caused by another condition, situation, or substance, such as: Having certain mental health conditions, such as anxiety and depression. Using caffeine, alcohol, tobacco, or drugs. Having gastrointestinal conditions, such as gastroesophageal reflux disease (GERD). Having certain medical conditions. These include: Asthma. Alzheimer's disease. Stroke. Chronic pain. An overactive thyroid  gland (hyperthyroidism). Other sleep disorders, such as restless legs syndrome and sleep apnea. Menopause. Sometimes, the cause of insomnia may not be known. What increases the risk? Risk factors for insomnia include: Gender. Females are affected more often than males. Age. Insomnia is more common as people get older. Stress and certain medical and mental health conditions. Lack of exercise. Having an irregular work schedule. This may include working night shifts and traveling between different time zones. What are the signs or symptoms? If you have insomnia, the main symptom is having trouble falling asleep or having trouble staying asleep. This may lead to other symptoms, such as: Feeling tired or having low energy. Feeling nervous about going to sleep. Not feeling rested in the morning. Having trouble concentrating. Feeling irritable, anxious, or  depressed. How is this diagnosed? This condition may be diagnosed based on: Your symptoms and medical history. Your health care provider may ask about: Your sleep habits. Any medical conditions you have. Your mental health. A physical exam. How is this treated? Treatment for insomnia depends on the cause. Treatment may focus on treating an underlying condition that is causing the insomnia. Treatment may also include: Medicines to help you sleep. Counseling or therapy. Lifestyle adjustments to help you sleep better. Follow these instructions at home: Eating and drinking  Limit or avoid alcohol, caffeinated beverages, and products that contain nicotine and tobacco, especially close to bedtime. These can disrupt your sleep. Do not eat a large meal or eat spicy foods right before bedtime. This can lead to digestive discomfort that can make it hard for you to sleep. Sleep habits  Keep a sleep diary to help you and your health care provider figure out what could be causing your insomnia. Write down: When you sleep. When you wake up during the night. How well you sleep and how rested you feel the next day. Any side effects of medicines you are taking. What you eat and drink. Make your bedroom a dark, comfortable place where it is easy to fall asleep. Put up shades or blackout curtains to block light from outside. Use a white noise machine to block noise. Keep the temperature cool. Limit screen use before bedtime. This includes: Not watching TV. Not using your smartphone, tablet, or computer. Stick to a routine that includes going to bed and waking up at the same times every day and night. This can help you fall asleep faster. Consider making a quiet activity, such as reading, part of your nighttime routine. Try to avoid taking naps during the day so that you sleep better at night. Get out  of bed if you are still awake after 15 minutes of trying to sleep. Keep the lights down, but try  reading or doing a quiet activity. When you feel sleepy, go back to bed. General instructions Take over-the-counter and prescription medicines only as told by your health care provider. Exercise regularly as told by your health care provider. However, avoid exercising in the hours right before bedtime. Use relaxation techniques to manage stress. Ask your health care provider to suggest some techniques that may work well for you. These may include: Breathing exercises. Routines to release muscle tension. Visualizing peaceful scenes. Make sure that you drive carefully. Do not drive if you feel very sleepy. Keep all follow-up visits. This is important. Contact a health care provider if: You are tired throughout the day. You have trouble in your daily routine due to sleepiness. You continue to have sleep problems, or your sleep problems get worse. Get help right away if: You have thoughts about hurting yourself or someone else. Get help right away if you feel like you may hurt yourself or others, or have thoughts about taking your own life. Go to your nearest emergency room or: Call 911. Call the National Suicide Prevention Lifeline at 228-271-9574 or 988. This is open 24 hours a day. Text the Crisis Text Line at 9544218733. Summary Insomnia is a sleep disorder that makes it difficult to fall asleep or stay asleep. Insomnia can be long-term (chronic) or short-term (acute). Treatment for insomnia depends on the cause. Treatment may focus on treating an underlying condition that is causing the insomnia. Keep a sleep diary to help you and your health care provider figure out what could be causing your insomnia. This information is not intended to replace advice given to you by your health care provider. Make sure you discuss any questions you have with your health care provider. Document Revised: 09/12/2021 Document Reviewed: 09/12/2021 Elsevier Patient Education  2024 [employer].

## 2024-04-17 NOTE — Progress Notes (Signed)
 Subjective:    Patient ID: Mandy [name], female    DOB: 07-17-1946, 78 y.o.   MRN: [id number]  Chief Complaint  Patient presents with   Medical Management of Chronic Issues    Patient presents today for a 5 month follow-up.   Quality Metric Gaps    Lung cancer screening    HPI Discussed the use of AI scribe software for clinical note transcription with the patient, who gave verbal consent to proceed.  History of Present Illness Mandy [name] is a 78 year old female who presents with a chronic cough.  She has a chronic dry cough that is intermittent and can be alleviated by using a Ricola lozenge. The cough has been present for some time, and she recalls a previous episode where changing medications resolved the issue. She was previously on losartan , and stopping it coincided with resolution of her cough. She has also been on statins in the past, but it is unclear if they contributed to the cough.  She has a history of pneumonia, which occurred in the summer. She is scheduled for a chest x-ray, mammogram, and bone density test next week.  She uses a CPAP machine for sleep apnea but reports that her sleep is still not optimal, as she sleeps in shifts and sometimes has good nights. She mentions that she sleeps better when away from home, such as when visiting her daughter in [place]. She also notes that her dog sometimes disrupts her sleep.  She has been using a product called 'Reflux Gourmet for her symptoms, which is an over-the-counter remedy made from seaweed, and she finds it palatable. She does not recall if it has been effective in alleviating her symptoms. No obvious heartburn, no recent illness. Denies CP/palp/SOB/HA/congestion/fevers/GI or GU c/o. Taking meds as prescribed. No dysphagia.    Past Medical History:  Diagnosis Date   Atrophic vaginitis 11/18/2012   Back pain    Chicken pox as a child   Cough 07/14/2016   Dermatitis 04/26/2011   GERD (gastroesophageal reflux  disease) 03/20/2017   High cholesterol    HTN (hypertension) 04/13/2010   Qualifier: Diagnosis of  By: Domenica MD, Minahil Quinlivan     Hypertension    Measles as a child   Miscarriage 1981   Onychomycosis 04/26/2011   OSA (obstructive sleep apnea) 10/30/2017   Osteopenia 10/17/1999   Osteopenia 04/13/2010   Qualifier: Diagnosis of  By: Domenica MD, Emalina Dubreuil     Preventative health care 07/14/2016   Screening for malignant neoplasm of the cervix 04/26/2011   Sleep apnea    Tinea pedis 05/10/2014   Urinary frequency 03/20/2017    Past Surgical History:  Procedure Laterality Date   cataracts Bilateral 2023   DILATION AND CURETTAGE OF UTERUS  10/17/1979   miscarriage    Family History  Problem Relation Age of Onset   Osteoporosis Mother    Coronary artery disease Mother    Heart attack Mother 31   Hypertension Mother    Dementia Mother    Hyperlipidemia Mother    Stroke Mother    Obesity Mother    Diabetes Father        type 2   Heart disease Father    Stroke Father    Obesity Father    Dementia Brother        Lewy Body   Parkinson's disease Brother    Birth defects Maternal Aunt    Dementia Maternal Grandmother    Asthma Maternal Grandfather  Cancer Paternal Grandmother        ovarian   Dementia Paternal Grandmother    Diabetes Paternal Grandfather        tyoe 2   Hearing loss Paternal Grandfather    Arthritis Other    Hyperlipidemia Other    Hypertension Other    Other Other        Cardiovascular disorder    Social History   Socioeconomic History   Marital status: Married    Spouse name: Medford   Number of children: Not on file   Years of education: Not on file   Highest education level: Bachelor's degree (e.g., BA, AB, BS)  Occupational History   Occupation: bookkeeper  Tobacco Use   Smoking status: Former    Current packs/day: 0.00    Average packs/day: 1 pack/day for 45.0 years (45.0 ttl pk-yrs)    Types: Cigarettes    Start date: 87    Quit date: 2013    Years  since quitting: 12.5   Smokeless tobacco: Never  Vaping Use   Vaping status: Never Used  Substance and Sexual Activity   Alcohol use: No   Drug use: No   Sexual activity: Yes    Partners: Male  Other Topics Concern   Not on file  Social History Narrative   Regular exercise- yes   Does Yoga 3-4 X week, personal trainer 1 X week   and walks regularly   Has lost 16 pounds by minimizing processed foods and meats   Social Drivers of Health   Financial Resource Strain: Low Risk  (04/12/2023)   Overall Financial Resource Strain (CARDIA)    Difficulty of Paying Living Expenses: Not hard at all  Food Insecurity: No Food Insecurity (04/12/2023)   Hunger Vital Sign    Worried About Running Out of Food in the Last Year: Never true    Ran Out of Food in the Last Year: Never true  Transportation Needs: No Transportation Needs (04/12/2023)   PRAPARE - Administrator, Civil Service (Medical): No    Lack of Transportation (Non-Medical): No  Physical Activity: Sufficiently Active (04/12/2023)   Exercise Vital Sign    Days of Exercise per Week: 5 days    Minutes of Exercise per Session: 30 min  Stress: No Stress Concern Present (04/12/2023)   [employer] of Occupational Health - Occupational Stress Questionnaire    Feeling of Stress : Not at all  Social Connections: Socially Integrated (04/12/2023)   Social Connection and Isolation Panel    Frequency of Communication with Friends and Family: Three times a week    Frequency of Social Gatherings with Friends and Family: Twice a week    Attends Religious Services: 1 to 4 times per year    Active Member of [employer] or Organizations: Yes    Attends Banker Meetings: 1 to 4 times per year    Marital Status: Married  Catering manager Violence: Not on file    Outpatient Medications Prior to Visit  Medication Sig Dispense Refill   b complex vitamins tablet Take 1 tablet by mouth daily.     calcium  citrate-vitamin D   (CITRACAL+D) 315-200 MG-UNIT per tablet Take 1 tablet by mouth 2 (two) times daily.     ezetimibe  (ZETIA ) 10 MG tablet Take 1 tablet (10 mg total) by mouth daily. 90 tablet 3   multivitamin (THERAGRAN) per tablet Take 1 tablet by mouth daily.     mupirocin  ointment (BACTROBAN ) 2 % APPLY INTO  THE NOSE AT BEDTIME AS NEEDED. 22 g 1   Nutritional Supplements (VITAMIN D  BOOSTER PO) Take by mouth.     Omega-3 Fatty Acids-Vitamin E (COROMEGA PO) Take by mouth.     Probiotic Product (PROBIOTIC DAILY PO) Take by mouth daily.     triamterene -hydrochlorothiazide (DYAZIDE) 37.5-25 MG capsule Take 1 each (1 capsule total) by mouth daily. 90 capsule 1   TURMERIC PO Take 1 capsule by mouth daily.      vitamin C (ASCORBIC ACID) 500 MG tablet Take 500 mg by mouth daily.     aspirin 81 MG tablet Take 81 mg by mouth daily.     azelastine  (ASTELIN ) 0.1 % nasal spray Place 1 spray into both nostrils daily as needed for rhinitis. Use in each nostril as directed 30 mL 5   Methylsulfonylmethane (MSM PO) Take 2 capsules by mouth daily. 1,000 mg 3 x daily     No facility-administered medications prior to visit.    Allergies  Allergen Reactions   Codeine    Pneumococcal Vaccine Polyvalent     REACTION: rash at injection site and induration.   Red Yeast Rice Extract     Leg pain    Review of Systems  Constitutional:  Negative for fever and malaise/fatigue.  HENT:  Negative for congestion.   Eyes:  Negative for blurred vision.  Respiratory:  Positive for cough. Negative for shortness of breath.   Cardiovascular:  Negative for chest pain, palpitations and leg swelling.  Gastrointestinal:  Negative for abdominal pain, blood in stool and nausea.  Genitourinary:  Negative for dysuria and frequency.  Musculoskeletal:  Negative for falls.  Skin:  Negative for rash.  Neurological:  Negative for dizziness, loss of consciousness and headaches.  Endo/Heme/Allergies:  Negative for environmental allergies.   Psychiatric/Behavioral:  Negative for depression. The patient is not nervous/anxious.        Objective:    Physical Exam Constitutional:      General: She is not in acute distress.    Appearance: Normal appearance. She is well-developed. She is not toxic-appearing.  HENT:     Head: Normocephalic and atraumatic.     Right Ear: External ear normal.     Left Ear: External ear normal.     Nose: Nose normal.  Eyes:     General:        Right eye: No discharge.        Left eye: No discharge.     Conjunctiva/sclera: Conjunctivae normal.  Neck:     Thyroid : No thyromegaly.  Cardiovascular:     Rate and Rhythm: Normal rate and regular rhythm.     Heart sounds: Normal heart sounds. No murmur heard. Pulmonary:     Effort: Pulmonary effort is normal. No respiratory distress.     Breath sounds: Normal breath sounds.  Abdominal:     General: Bowel sounds are normal.     Palpations: Abdomen is soft.     Tenderness: There is no abdominal tenderness. There is no guarding.  Musculoskeletal:        General: Normal range of motion.     Cervical back: Neck supple.  Lymphadenopathy:     Cervical: No cervical adenopathy.  Skin:    General: Skin is warm and dry.  Neurological:     Mental Status: She is alert and oriented to person, place, and time.  Psychiatric:        Mood and Affect: Mood normal.        Behavior:  Behavior normal.        Thought Content: Thought content normal.        Judgment: Judgment normal.     BP 128/70   Pulse 60   Resp 16   Ht 5' 2 (1.575 m)   Wt 182 lb 6.4 oz (82.7 kg)   SpO2 97%   BMI 33.36 kg/m  Wt Readings from Last 3 Encounters:  04/17/24 182 lb 6.4 oz (82.7 kg)  11/15/23 176 lb 3.2 oz (79.9 kg)  05/17/23 184 lb 12.8 oz (83.8 kg)    Diabetic Foot Exam - Simple   No data filed    Lab Results  Component Value Date   WBC 4.8 11/15/2023   HGB 14.2 11/15/2023   HCT 42.4 11/15/2023   PLT 373.0 11/15/2023   GLUCOSE 80 11/15/2023   CHOL 202  (H) 11/15/2023   TRIG 72.0 11/15/2023   HDL 69.50 11/15/2023   LDLDIRECT 113.4 11/12/2012   LDLCALC 118 (H) 11/15/2023   ALT 11 11/15/2023   AST 18 11/15/2023   NA 138 11/15/2023   K 4.3 11/15/2023   CL 100 11/15/2023   CREATININE 0.77 11/15/2023   BUN 24 (H) 11/15/2023   CO2 31 11/15/2023   TSH 2.04 11/15/2023   HGBA1C 5.6 11/15/2023    Lab Results  Component Value Date   TSH 2.04 11/15/2023   Lab Results  Component Value Date   WBC 4.8 11/15/2023   HGB 14.2 11/15/2023   HCT 42.4 11/15/2023   MCV 98.8 11/15/2023   PLT 373.0 11/15/2023   Lab Results  Component Value Date   NA 138 11/15/2023   K 4.3 11/15/2023   CO2 31 11/15/2023   GLUCOSE 80 11/15/2023   BUN 24 (H) 11/15/2023   CREATININE 0.77 11/15/2023   BILITOT 0.7 11/15/2023   ALKPHOS 55 11/15/2023   AST 18 11/15/2023   ALT 11 11/15/2023   PROT 6.2 11/15/2023   ALBUMIN 4.0 11/15/2023   CALCIUM  9.2 11/15/2023   EGFR 77 06/08/2021   GFR 73.65 11/15/2023   Lab Results  Component Value Date   CHOL 202 (H) 11/15/2023   Lab Results  Component Value Date   HDL 69.50 11/15/2023   Lab Results  Component Value Date   LDLCALC 118 (H) 11/15/2023   Lab Results  Component Value Date   TRIG 72.0 11/15/2023   Lab Results  Component Value Date   CHOLHDL 3 11/15/2023   Lab Results  Component Value Date   HGBA1C 5.6 11/15/2023       Assessment & Plan:  Essential hypertension Assessment & Plan: Well controlled, no changes to meds. Encouraged heart healthy diet such as the DASH diet and exercise as tolerated.   Orders: -     CBC with Differential/Platelet -     Comprehensive metabolic panel with GFR -     TSH  Hyperglycemia Assessment & Plan: hgba1c acceptable, minimize simple carbs. Increase exercise as tolerated. Continue current meds  Orders: -     Hemoglobin A1c  Mixed hyperlipidemia Assessment & Plan: Encourage heart healthy diet such as MIND or DASH diet, increase exercise, avoid trans  fats, simple carbohydrates and processed foods, consider a krill or fish or flaxseed oil cap daily.   Orders: -     Lipid panel  Vitamin D  deficiency Assessment & Plan: Supplement and monitor     Assessment and Plan Assessment & Plan Chronic Cough Chronic cough, dry and intermittent, possibly due to low-grade allergies or silent reflux.  Reflux Rescue used, not a medication. - Order chest x-ray to rule out pneumonia recurrence. - Continue Reflux Rescue and monitor effect on cough. - Consider trial of cetirizine and famotidine  if cough persists. - Discontinue one medication at a time if cough resolves to determine cause. - Consider pulmonology referral if cough persists. - Discuss gabapentin  for cough with pulmonologist if needed.  Obstructive Sleep Apnea (OSA) OSA managed with CPAP, improving symptoms but not fully resolving poor sleep quality. - Continue CPAP as prescribed. - Discuss sleep patterns and CPAP use with pulmonologist. - Consider CBTI Coach app for insomnia management.  Insomnia Insomnia with poor sleep quality and shift sleeping. CBTi recommended over medications. - Consider CBTI Coach app for insomnia management.  General Health Maintenance Routine health screenings and lab work due. - Perform routine lab work during this visit. - Ensure completion of scheduled mammogram, bone density test, and chest x-ray.  Follow-up Follow-up scheduled in six months with pulmonologist appointment at the end of the month. - Follow up in six months for routine evaluation. - Communicate via MyChart if issues arise before next appointment. - Discuss cough and sleep apnea management with pulmonologist.     Harlene Horton, MD

## 2024-04-19 ENCOUNTER — Ambulatory Visit: Payer: Self-pay | Admitting: Family Medicine

## 2024-04-21 NOTE — Progress Notes (Signed)
Patient reviewed via MyChart.

## 2024-04-28 ENCOUNTER — Encounter (HOSPITAL_BASED_OUTPATIENT_CLINIC_OR_DEPARTMENT_OTHER): Payer: Self-pay

## 2024-04-28 ENCOUNTER — Ambulatory Visit (HOSPITAL_BASED_OUTPATIENT_CLINIC_OR_DEPARTMENT_OTHER)
Admission: RE | Admit: 2024-04-28 | Discharge: 2024-04-28 | Disposition: A | Discharge status: Home / Self Care | Source: Ambulatory Visit | Attending: Otolaryngology | Admitting: Otolaryngology

## 2024-04-28 ENCOUNTER — Ambulatory Visit (HOSPITAL_BASED_OUTPATIENT_CLINIC_OR_DEPARTMENT_OTHER)
Admission: RE | Admit: 2024-04-28 | Discharge: 2024-04-28 | Disposition: A | Discharge status: Home / Self Care | Source: Ambulatory Visit | Attending: Family Medicine | Admitting: Family Medicine

## 2024-04-28 DIAGNOSIS — Z1231 Encounter for screening mammogram for malignant neoplasm of breast: Secondary | ICD-10-CM | POA: Diagnosis not present

## 2024-04-28 DIAGNOSIS — R053 Chronic cough: Secondary | ICD-10-CM | POA: Diagnosis not present

## 2024-04-28 DIAGNOSIS — E2839 Other primary ovarian failure: Secondary | ICD-10-CM | POA: Insufficient documentation

## 2024-04-28 DIAGNOSIS — Z780 Asymptomatic menopausal state: Secondary | ICD-10-CM | POA: Insufficient documentation

## 2024-04-29 ENCOUNTER — Ambulatory Visit: Payer: Self-pay | Admitting: Family Medicine

## 2024-04-30 ENCOUNTER — Ambulatory Visit (INDEPENDENT_AMBULATORY_CARE_PROVIDER_SITE_OTHER): Payer: Self-pay

## 2024-04-30 NOTE — Telephone Encounter (Signed)
 I gave the patient her scan results. Patient understood. She wanted me to let Dr. Okey know that the reflux gourmet is working.

## 2024-05-02 DIAGNOSIS — I10 Essential (primary) hypertension: Secondary | ICD-10-CM | POA: Diagnosis not present

## 2024-05-02 DIAGNOSIS — G4700 Insomnia, unspecified: Secondary | ICD-10-CM | POA: Diagnosis not present

## 2024-05-02 DIAGNOSIS — G4730 Sleep apnea, unspecified: Secondary | ICD-10-CM | POA: Diagnosis not present

## 2024-05-02 DIAGNOSIS — K5909 Other constipation: Secondary | ICD-10-CM | POA: Diagnosis not present

## 2024-05-02 DIAGNOSIS — E669 Obesity, unspecified: Secondary | ICD-10-CM | POA: Diagnosis not present

## 2024-05-02 DIAGNOSIS — E785 Hyperlipidemia, unspecified: Secondary | ICD-10-CM | POA: Diagnosis not present

## 2024-05-08 DIAGNOSIS — H02831 Dermatochalasis of right upper eyelid: Secondary | ICD-10-CM | POA: Diagnosis not present

## 2024-05-08 DIAGNOSIS — H02834 Dermatochalasis of left upper eyelid: Secondary | ICD-10-CM | POA: Diagnosis not present

## 2024-05-08 DIAGNOSIS — H02423 Myogenic ptosis of bilateral eyelids: Secondary | ICD-10-CM | POA: Diagnosis not present

## 2024-05-08 DIAGNOSIS — H0279 Other degenerative disorders of eyelid and periocular area: Secondary | ICD-10-CM | POA: Diagnosis not present

## 2024-05-08 DIAGNOSIS — H53483 Generalized contraction of visual field, bilateral: Secondary | ICD-10-CM | POA: Diagnosis not present

## 2024-05-08 DIAGNOSIS — H02422 Myogenic ptosis of left eyelid: Secondary | ICD-10-CM | POA: Diagnosis not present

## 2024-05-08 DIAGNOSIS — H04123 Dry eye syndrome of bilateral lacrimal glands: Secondary | ICD-10-CM | POA: Diagnosis not present

## 2024-05-08 DIAGNOSIS — H57813 Brow ptosis, bilateral: Secondary | ICD-10-CM | POA: Diagnosis not present

## 2024-05-08 DIAGNOSIS — B0052 Herpesviral keratitis: Secondary | ICD-10-CM | POA: Diagnosis not present

## 2024-05-08 DIAGNOSIS — H02421 Myogenic ptosis of right eyelid: Secondary | ICD-10-CM | POA: Diagnosis not present

## 2024-05-12 DIAGNOSIS — G4733 Obstructive sleep apnea (adult) (pediatric): Secondary | ICD-10-CM | POA: Diagnosis not present

## 2024-05-15 ENCOUNTER — Telehealth: Payer: Self-pay | Admitting: Adult Health

## 2024-05-15 ENCOUNTER — Ambulatory Visit: Admitting: Adult Health

## 2024-05-15 ENCOUNTER — Encounter: Payer: Self-pay | Admitting: Adult Health

## 2024-05-15 VITALS — BP 143/86 | HR 62 | Ht 61.5 in | Wt 186.0 lb

## 2024-05-15 DIAGNOSIS — Z87891 Personal history of nicotine dependence: Secondary | ICD-10-CM | POA: Diagnosis not present

## 2024-05-15 DIAGNOSIS — E66811 Obesity, class 1: Secondary | ICD-10-CM | POA: Diagnosis not present

## 2024-05-15 DIAGNOSIS — Z6832 Body mass index (BMI) 32.0-32.9, adult: Secondary | ICD-10-CM | POA: Diagnosis not present

## 2024-05-15 DIAGNOSIS — G4733 Obstructive sleep apnea (adult) (pediatric): Secondary | ICD-10-CM | POA: Diagnosis not present

## 2024-05-15 DIAGNOSIS — J310 Chronic rhinitis: Secondary | ICD-10-CM

## 2024-05-15 NOTE — Progress Notes (Signed)
 all what is this referral to DME  @Patient  ID: Mandy [name], female    DOB: [birth year], 78 y.o.   MRN: [id number]  Chief Complaint  Patient presents with   Sleep Apnea    C-pap is not recording sleep time correctly. Sleeping well, rested in am Still has sleepiness during the day    Referring provider: Domenica Harlene LABOR, MD  HPI: 78 year old female former smoker followed for obstructive sleep apnea and chronic allergic rhinitis  TEST/EVENTS :  HST 10/26/17 >> AHI 17.4, SaO2 low 79%   05/15/2024 Follow up: OSA and AR  Patient presents for a 1 year follow-up.  Patient has underlying moderate obstructive sleep apnea.  She wears her CPAP every still night.  Feels that she benefits from CPAP with decreased daytime sleepiness.  CPAP download shows excellent compliance with daily average usage at 6 hours.  AHI 0.5/hour  She has chronic allergic rhinitis.  Says that she is doing well.    Allergies  Allergen Reactions   Codeine    Pneumococcal Vaccine Polyvalent     REACTION: rash at injection site and induration.   Red Yeast Rice Extract     Leg pain    Immunization History  Administered Date(s) Administered   Fluad Quad(high Dose 65+) 07/12/2019, 07/13/2020, 06/21/2021, 07/14/2022   Fluad Trivalent(High Dose 65+) 08/16/2023   Hepatitis B 04/13/2008, 06/15/2008, 12/17/2008   Influenza Split 08/03/2011, 07/31/2012   Influenza, High Dose Seasonal PF 07/23/2013, 07/30/2015, 07/14/2016, 07/26/2017, 07/19/2018   Influenza,inj,Quad PF,6+ Mos 07/08/2014   PFIZER(Purple Top)SARS-COV-2 Vaccination 11/21/2019, 12/15/2019   Pneumococcal Conjugate-13 10/30/2013   Pneumococcal Polysaccharide-23 12/23/2009, 12/21/2015   Td 05/11/2010   Tdap 05/07/2022   Zoster Recombinant(Shingrix ) 06/27/2019, 09/17/2019   Zoster, Live 09/23/2008    Past Medical History:  Diagnosis Date   Atrophic vaginitis 11/18/2012   Back pain    Chicken pox as a child   Cough 07/14/2016   Dermatitis 04/26/2011   GERD  (gastroesophageal reflux disease) 03/20/2017   High cholesterol    HTN (hypertension) 04/13/2010   Qualifier: Diagnosis of  By: Domenica MD, Stacey     Hypertension    Measles as a child   Miscarriage 1981   Onychomycosis 04/26/2011   OSA (obstructive sleep apnea) 10/30/2017   Osteopenia 10/17/1999   Osteopenia 04/13/2010   Qualifier: Diagnosis of  By: Domenica MD, Stacey     Preventative health care 07/14/2016   Screening for malignant neoplasm of the cervix 04/26/2011   Sleep apnea    Tinea pedis 05/10/2014   Urinary frequency 03/20/2017    Tobacco History: Social History   Tobacco Use  Smoking Status Former   Current packs/day: 0.00   Average packs/day: 1 pack/day for 45.0 years (45.0 ttl pk-yrs)   Types: Cigarettes   Start date: 1968   Quit date: 2013   Years since quitting: 12.5  Smokeless Tobacco Never   Counseling given: Not Answered   Outpatient Medications Prior to Visit  Medication Sig Dispense Refill   b complex vitamins tablet Take 1 tablet by mouth daily.     CRANBERRY CONCENTRATE PO Take 1 tablet by mouth in the morning and at bedtime.     Docusate Sodium (COLACE PO) Take 1 tablet by mouth in the morning and at bedtime.     ezetimibe  (ZETIA ) 10 MG tablet Take 1 tablet (10 mg total) by mouth daily. 90 tablet 1   mupirocin  ointment (BACTROBAN ) 2 % APPLY INTO THE NOSE AT BEDTIME AS NEEDED. 22 g 1  Nutritional Supplements (VITAMIN D  BOOSTER PO) Take by mouth.     Probiotic Product (PROBIOTIC DAILY PO) Take by mouth daily.     triamterene -hydrochlorothiazide (DYAZIDE) 37.5-25 MG capsule Take 1 each (1 capsule total) by mouth daily. 90 capsule 1   TURMERIC PO Take 1 capsule by mouth daily.      calcium  citrate-vitamin D  (CITRACAL+D) 315-200 MG-UNIT per tablet Take 1 tablet by mouth 2 (two) times daily. (Patient not taking: Reported on 05/15/2024)     multivitamin (THERAGRAN) per tablet Take 1 tablet by mouth daily. (Patient not taking: Reported on 05/15/2024)     Omega-3 Fatty  Acids-Vitamin E (COROMEGA PO) Take by mouth. (Patient not taking: Reported on 05/15/2024)     vitamin C (ASCORBIC ACID) 500 MG tablet Take 500 mg by mouth daily. (Patient not taking: Reported on 05/15/2024)     No facility-administered medications prior to visit.     Review of Systems:   Constitutional:   No  weight loss, night sweats,  Fevers, chills, fatigue, or  lassitude.  HEENT:   No headaches,  Difficulty swallowing,  Tooth/dental problems, or  Sore throat,                No sneezing, itching, ear ache, nasal congestion, post nasal drip,   CV:  No chest pain,  Orthopnea, PND, swelling in lower extremities, anasarca, dizziness, palpitations, syncope.   GI  No heartburn, indigestion, abdominal pain, nausea, vomiting, diarrhea, change in bowel habits, loss of appetite, bloody stools.   Resp: No shortness of breath with exertion or at rest.  No excess mucus, no productive cough,  No non-productive cough,  No coughing up of blood.  No change in color of mucus.  No wheezing.  No chest wall deformity  Skin: no rash or lesions.  GU: no dysuria, change in color of urine, no urgency or frequency.  No flank pain, no hematuria   MS:  No joint pain or swelling.  No decreased range of motion.  No back pain.    Physical Exam  BP (!) 143/86   Pulse 62   Ht 5' 1.5 (1.562 m)   Wt 186 lb (84.4 kg)   SpO2 96% Comment: RA  BMI 34.58 kg/m   GEN: A/Ox3; pleasant , NAD, well nourished    HEENT:  [place]/AT,  , NOSE-clear, THROAT-clear, no lesions, no postnasal drip or exudate noted.   NECK:  Supple w/ fair ROM; no JVD; normal carotid impulses w/o bruits; no thyromegaly or nodules palpated; no lymphadenopathy.    RESP  Clear  P & A; w/o, wheezes/ rales/ or rhonchi. no accessory muscle use, no dullness to percussion  CARD:  RRR, no m/r/g, no peripheral edema, pulses intact, no cyanosis or clubbing.  GI:   Soft & nt; nml bowel sounds; no organomegaly or masses detected.   Musco: Warm bil, no  deformities or joint swelling noted.   Neuro: alert, no focal deficits noted.    Skin: Warm, no lesions or rashes    Lab Results:  CBC  BNP No results found for: BNP  ProBNP No results found for: PROBNP  Imaging:   Administration History     None           No data to display          No results found for: NITRICOXIDE      Assessment & Plan:   OSA (obstructive sleep apnea) Excellent control and compliance   Plan  Patient Instructions  Allegra 180mg  daily As needed   Chlorpheniramine 4mg  (Chlortab) At bedtime  As needed  Drainage  Saline nasal spray Twice daily   Saline nasal gel At bedtime  .  Flonase 1 puff daily in am As needed   Astelin  1 puffs daily in pm . As needed   Continue on CPAP At bedtime  -wear each night  Keep up good work.  Stay active , healthy diet  Do not drive if sleepy   Follow up with Carleah Yablonski NP in 1 year and As needed      Chronic rhinitis Controlled   Plan  Patient Instructions  Allegra 180mg  daily As needed   Chlorpheniramine 4mg  (Chlortab) At bedtime  As needed  Drainage  Saline nasal spray Twice daily   Saline nasal gel At bedtime  .  Flonase 1 puff daily in am As needed   Astelin  1 puffs daily in pm . As needed   Continue on CPAP At bedtime  -wear each night  Keep up good work.  Stay active , healthy diet  Do not drive if sleepy   Follow up with Simran Mannis NP in 1 year and As needed    '  Class 1 obesity with serious comorbidity and body mass index (BMI) of 32.0 to 32.9 in adult Healthy weight loss discussed      Madelin Stank, NP 05/15/2024

## 2024-05-15 NOTE — Assessment & Plan Note (Signed)
 Healthy weight loss discussed

## 2024-05-15 NOTE — Assessment & Plan Note (Signed)
 Controlled   Plan  Patient Instructions  Allegra 180mg  daily As needed   Chlorpheniramine 4mg  (Chlortab) At bedtime  As needed  Drainage  Saline nasal spray Twice daily   Saline nasal gel At bedtime  .  Flonase 1 puff daily in am As needed   Astelin  1 puffs daily in pm . As needed   Continue on CPAP At bedtime  -wear each night  Keep up good work.  Stay active , healthy diet  Do not drive if sleepy   Follow up with Norfleet Capers NP in 1 year and As needed    '

## 2024-05-15 NOTE — Telephone Encounter (Signed)
 Hey there what is needed for this order?

## 2024-05-15 NOTE — Assessment & Plan Note (Signed)
 Excellent control and compliance   Plan  Patient Instructions  Allegra 180mg  daily As needed   Chlorpheniramine 4mg  (Chlortab) At bedtime  As needed  Drainage  Saline nasal spray Twice daily   Saline nasal gel At bedtime  .  Flonase 1 puff daily in am As needed   Astelin  1 puffs daily in pm . As needed   Continue on CPAP At bedtime  -wear each night  Keep up good work.  Stay active , healthy diet  Do not drive if sleepy   Follow up with Bellamarie Pflug NP in 1 year and As needed

## 2024-05-15 NOTE — Patient Instructions (Signed)
 Allegra 180mg  daily As needed   Chlorpheniramine 4mg  (Chlortab) At bedtime  As needed  Drainage  Saline nasal spray Twice daily   Saline nasal gel At bedtime  .  Flonase 1 puff daily in am As needed   Astelin  1 puffs daily in pm . As needed   Continue on CPAP At bedtime  -wear each night  Keep up good work.  Stay active , healthy diet  Do not drive if sleepy   Follow up with Allaina Brotzman NP in 1 year and As needed

## 2024-05-16 ENCOUNTER — Ambulatory Visit: Payer: Medicare HMO | Admitting: Adult Health

## 2024-05-19 DIAGNOSIS — D0439 Carcinoma in situ of skin of other parts of face: Secondary | ICD-10-CM | POA: Diagnosis not present

## 2024-05-19 DIAGNOSIS — C44622 Squamous cell carcinoma of skin of right upper limb, including shoulder: Secondary | ICD-10-CM | POA: Diagnosis not present

## 2024-05-19 NOTE — Addendum Note (Signed)
 Addended by: ROLANDA POWELL SAILOR on: 05/19/2024 08:40 AM   Modules accepted: Orders

## 2024-05-19 NOTE — Telephone Encounter (Signed)
 Epic did not take my .phrase.  I edited the order for CPAP supplies.  Tammy will not be back in the office until Thursday to sign the order.  Thank you.

## 2024-05-19 NOTE — Telephone Encounter (Signed)
 As soon as it is signed we will send to DME

## 2024-05-21 NOTE — Telephone Encounter (Signed)
 I fixed the DME order, it did not take my dot phrase.  The order just needs to be signed what you get a chance.  Thank you.

## 2024-05-21 NOTE — Telephone Encounter (Signed)
 What order needs to be entered ?

## 2024-05-26 ENCOUNTER — Other Ambulatory Visit: Payer: Self-pay | Admitting: Family Medicine

## 2024-05-26 DIAGNOSIS — H02423 Myogenic ptosis of bilateral eyelids: Secondary | ICD-10-CM | POA: Diagnosis not present

## 2024-05-29 DIAGNOSIS — G4730 Sleep apnea, unspecified: Secondary | ICD-10-CM | POA: Diagnosis not present

## 2024-05-29 DIAGNOSIS — K5909 Other constipation: Secondary | ICD-10-CM | POA: Diagnosis not present

## 2024-05-29 DIAGNOSIS — G4700 Insomnia, unspecified: Secondary | ICD-10-CM | POA: Diagnosis not present

## 2024-05-29 DIAGNOSIS — I10 Essential (primary) hypertension: Secondary | ICD-10-CM | POA: Diagnosis not present

## 2024-05-29 DIAGNOSIS — E785 Hyperlipidemia, unspecified: Secondary | ICD-10-CM | POA: Diagnosis not present

## 2024-05-29 DIAGNOSIS — E669 Obesity, unspecified: Secondary | ICD-10-CM | POA: Diagnosis not present

## 2024-06-26 DIAGNOSIS — K5909 Other constipation: Secondary | ICD-10-CM | POA: Diagnosis not present

## 2024-06-26 DIAGNOSIS — E669 Obesity, unspecified: Secondary | ICD-10-CM | POA: Diagnosis not present

## 2024-06-26 DIAGNOSIS — G4700 Insomnia, unspecified: Secondary | ICD-10-CM | POA: Diagnosis not present

## 2024-06-26 DIAGNOSIS — E785 Hyperlipidemia, unspecified: Secondary | ICD-10-CM | POA: Diagnosis not present

## 2024-06-26 DIAGNOSIS — G4730 Sleep apnea, unspecified: Secondary | ICD-10-CM | POA: Diagnosis not present

## 2024-06-26 DIAGNOSIS — I10 Essential (primary) hypertension: Secondary | ICD-10-CM | POA: Diagnosis not present

## 2024-07-02 ENCOUNTER — Telehealth: Payer: Self-pay | Admitting: Family Medicine

## 2024-07-02 NOTE — Telephone Encounter (Signed)
 Copied from CRM [id number]. Topic: Medicare AWV >> Jul 02, 2024  2:09 PM Nathanel DEL wrote: Reason for CRM: Called LVM 07/02/2024 to schedule AWV. Please schedule Virtual or Telehealth visits ONLY.   Nathanel Paschal; Care Guide Ambulatory Clinical Support [place] [facility] Direct Dial: [phone]

## 2024-07-04 ENCOUNTER — Ambulatory Visit: Admitting: Orthopedic Surgery

## 2024-07-04 ENCOUNTER — Other Ambulatory Visit (INDEPENDENT_AMBULATORY_CARE_PROVIDER_SITE_OTHER): Payer: Self-pay

## 2024-07-04 ENCOUNTER — Encounter: Payer: Self-pay | Admitting: Orthopedic Surgery

## 2024-07-04 DIAGNOSIS — M1711 Unilateral primary osteoarthritis, right knee: Secondary | ICD-10-CM

## 2024-07-04 DIAGNOSIS — M25561 Pain in right knee: Secondary | ICD-10-CM | POA: Diagnosis not present

## 2024-07-04 MED ORDER — LIDOCAINE HCL 1 % IJ SOLN
5.0000 mL | INTRAMUSCULAR | Status: AC | PRN
  Administered 2024-07-04: 5 mL

## 2024-07-04 MED ORDER — BUPIVACAINE HCL 0.25 % IJ SOLN
4.0000 mL | INTRAMUSCULAR | Status: AC | PRN
  Administered 2024-07-04: 4 mL via INTRA_ARTICULAR

## 2024-07-04 MED ORDER — TRIAMCINOLONE ACETONIDE 40 MG/ML IJ SUSP
40.0000 mg | INTRAMUSCULAR | Status: AC | PRN
  Administered 2024-07-04: 40 mg via INTRA_ARTICULAR

## 2024-07-04 NOTE — Progress Notes (Signed)
 Office Visit Note   Patient: Mandy [name]           Date of Birth: 21-Feb-1946           MRN: [id number] Visit Date: 07/04/2024 Requested by: Domenica Harlene LABOR, MD [address] [place],  [place] [address] PCP: Domenica Harlene LABOR, MD  Subjective: Chief Complaint  Patient presents with   Right Knee - Pain    HPI: Mandy [name] is a 78 y.o. female who presents to the office reporting right knee pain.  Reports pain in the lateral and posterior aspect of the knee.  Been getting worse over the past month.  Does report some swelling.  She did have prior injection in 2023 which gave her very good relief.  Those old notes were reviewed.  Having a little bit of back pain with radicular leg pain as well.  She walks about 1-1/2 miles daily.  She lives in a two-story house and does have some pain with stairs.  Tylenol helps her symptoms.  She does use ice daily.  She did have MRI of the lumbar spine which showed some lumbar spine spondylosis but no acute osseous injury done about a year and a half ago..                ROS: All systems reviewed are negative as they relate to the chief complaint within the history of present illness.  Patient denies fevers or chills.  Assessment & Plan: Visit Diagnoses:  1. Right knee pain, unspecified chronicity     Plan: Impression is recurrent right knee pain.  Good result with injections in the past.  Repeat injection performed today.  Will see how she does with that intervention.  Follow-up with us  as needed.  If her back is hurting more we could also get her set up with epidural steroid injections in her lumbar spine.  Follow-Up Instructions: No follow-ups on file.   Orders:  Orders Placed This Encounter  Procedures   XR Knee 1-2 Views Right   No orders of the defined types were placed in this encounter.     Procedures: Large Joint Inj: R knee on 07/04/2024 9:42 PM Indications: diagnostic evaluation, joint swelling and pain Details: 18 G 1.5  in needle, superolateral approach  Arthrogram: No  Medications: 5 mL lidocaine  1 %; 4 mL bupivacaine  0.25 %; 40 mg triamcinolone  acetonide 40 MG/ML Outcome: tolerated well, no immediate complications Procedure, treatment alternatives, risks and benefits explained, specific risks discussed. Consent was given by the patient. Immediately prior to procedure a time out was called to verify the correct patient, procedure, equipment, support staff and site/side marked as required. Patient was prepped and draped in the usual sterile fashion.       Clinical Data: No additional findings.  Objective: Vital Signs: There were no vitals taken for this visit.  Physical Exam:  Constitutional: Patient appears well-developed HEENT:  Head: Normocephalic Eyes:EOM are normal Neck: Normal range of motion Cardiovascular: Normal rate Pulmonary/chest: Effort normal Neurologic: Patient is alert Skin: Skin is warm Psychiatric: Patient has normal mood and affect  Ortho Exam: Patient has bilateral 5 out of 5 ankle dorsiflexion plantarflexion quad hamstring and hip flexion strength.  Bilateral palpable pedal pulses and no paresthesias L1-S1 in either leg.  No nerve root tension signs.  Minimal pain with forward and lateral bending.  No groin pain with internal and external rotation of either leg.  No trochanteric tenderness.  No masses  lymphadenopathy or skin changes around the back.  Right knee exam demonstrates full range of motion with mild medial lateral joint line tenderness.  Extensor mechanism intact.  Mild patellofemoral crepitus.  No effusion.  Collateral cruciate ligaments are stable.  Equivocal McMurray compression testing.  No groin pain with internal or external rotation of the leg.  Specialty Comments:  No specialty comments available.  Imaging: XR Knee 1-2 Views Right Result Date: 07/04/2024 AP lateral radiographs right knee reviewed.  Mild arthritis is present in the medial and lateral  compartments.  Alignment normal.  No acute fracture.    PMFS History: Patient Active Problem List   Diagnosis Date Noted   Excessive cerumen in both ear canals 10/27/2022   Dry skin dermatitis 12/22/2021   Acute pain of right knee 12/22/2021   History of tobacco abuse 12/08/2021   Chronic rhinitis 12/03/2020   Hyperglycemia 06/28/2020   Class 1 obesity with serious comorbidity and body mass index (BMI) of 32.0 to 32.9 in adult 06/28/2020   Other fatigue 06/17/2020   Essential hypertension 06/17/2020   Low back pain 03/30/2019   OSA (obstructive sleep apnea) 10/30/2017   Frequent nocturnal awakening 07/27/2017   GERD (gastroesophageal reflux disease) 03/20/2017   Urinary frequency 03/20/2017   Preventative health care 07/14/2016   Cough 07/14/2016   Medicare annual wellness visit, subsequent 05/10/2014   Tinea pedis 05/10/2014   Atrophic vaginitis 11/18/2012   Screening for malignant neoplasm of cervix 04/26/2011   Onychomycosis 04/26/2011   Mixed hyperlipidemia 05/11/2010   Vitamin D  deficiency 04/13/2010   CARPAL TUNNEL SYNDROME, RIGHT, MILD 04/13/2010   Trigger finger, acquired 04/13/2010   Osteopenia 04/13/2010   MUMPS, HX OF 04/13/2010   Personal history presenting hazards to health 04/13/2010   Past Medical History:  Diagnosis Date   Atrophic vaginitis 11/18/2012   Back pain    Chicken pox as a child   Cough 07/14/2016   Dermatitis 04/26/2011   GERD (gastroesophageal reflux disease) 03/20/2017   High cholesterol    HTN (hypertension) 04/13/2010   Qualifier: Diagnosis of  By: Domenica MD, Stacey     Hypertension    Measles as a child   Miscarriage 1981   Onychomycosis 04/26/2011   OSA (obstructive sleep apnea) 10/30/2017   Osteopenia 10/17/1999   Osteopenia 04/13/2010   Qualifier: Diagnosis of  By: Domenica MD, Stacey     Preventative health care 07/14/2016   Screening for malignant neoplasm of the cervix 04/26/2011   Sleep apnea    Tinea pedis 05/10/2014   Urinary frequency  03/20/2017    Family History  Problem Relation Age of Onset   Osteoporosis Mother    Coronary artery disease Mother    Heart attack Mother 2   Hypertension Mother    Dementia Mother    Hyperlipidemia Mother    Stroke Mother    Obesity Mother    Diabetes Father        type 2   Heart disease Father    Stroke Father    Obesity Father    Dementia Brother        Lewy Body   Parkinson's disease Brother    Birth defects Maternal Aunt    Dementia Maternal Grandmother    Asthma Maternal Grandfather    Cancer Paternal Grandmother        ovarian   Dementia Paternal Grandmother    Diabetes Paternal Grandfather        tyoe 2   Hearing loss Paternal Actor  Arthritis Other    Hyperlipidemia Other    Hypertension Other    Other Other        Cardiovascular disorder    Past Surgical History:  Procedure Laterality Date   cataracts Bilateral 2023   DILATION AND CURETTAGE OF UTERUS  10/17/1979   miscarriage   Social History   Occupational History   Occupation: bookkeeper  Tobacco Use   Smoking status: Former    Current packs/day: 0.00    Average packs/day: 1 pack/day for 45.0 years (45.0 ttl pk-yrs)    Types: Cigarettes    Start date: 1968    Quit date: 2013    Years since quitting: 12.7   Smokeless tobacco: Never  Vaping Use   Vaping status: Never Used  Substance and Sexual Activity   Alcohol use: No   Drug use: No   Sexual activity: Yes    Partners: Male

## 2024-07-21 DIAGNOSIS — H04123 Dry eye syndrome of bilateral lacrimal glands: Secondary | ICD-10-CM | POA: Diagnosis not present

## 2024-07-21 DIAGNOSIS — Z961 Presence of intraocular lens: Secondary | ICD-10-CM | POA: Diagnosis not present

## 2024-07-23 ENCOUNTER — Ambulatory Visit (INDEPENDENT_AMBULATORY_CARE_PROVIDER_SITE_OTHER): Admitting: Otolaryngology

## 2024-07-24 ENCOUNTER — Ambulatory Visit (INDEPENDENT_AMBULATORY_CARE_PROVIDER_SITE_OTHER): Admitting: Otolaryngology

## 2024-07-24 ENCOUNTER — Encounter (INDEPENDENT_AMBULATORY_CARE_PROVIDER_SITE_OTHER): Payer: Self-pay | Admitting: Otolaryngology

## 2024-07-24 VITALS — BP 131/74 | HR 61 | Temp 97.8°F

## 2024-07-24 DIAGNOSIS — J343 Hypertrophy of nasal turbinates: Secondary | ICD-10-CM

## 2024-07-24 DIAGNOSIS — J342 Deviated nasal septum: Secondary | ICD-10-CM | POA: Diagnosis not present

## 2024-07-24 DIAGNOSIS — K219 Gastro-esophageal reflux disease without esophagitis: Secondary | ICD-10-CM | POA: Diagnosis not present

## 2024-07-24 DIAGNOSIS — J3089 Other allergic rhinitis: Secondary | ICD-10-CM

## 2024-07-24 DIAGNOSIS — R053 Chronic cough: Secondary | ICD-10-CM | POA: Diagnosis not present

## 2024-07-24 NOTE — Progress Notes (Signed)
 ENT Progress Note:   Update 07/24/2024  History of Present Illness Mandy [name] is a 78 year old female who presents for f/u of chronic cough.  She has experienced significant improvement in her chronic cough, which she attributes to taking Gourmet Reflux after every meal. An x-ray performed previously returned normal results.  She identifies vinegar in salad dressing as a trigger for her cough, although it does not upset her stomach.  Records Reviewed:  Initial Evaluation  Reason for Consult: chronic cough    HPI: Discussed the use of AI scribe software for clinical note transcription with the patient, who gave verbal consent to proceed.  History of Present Illness Mandy [name] is a 78 year old female who presents with a chronic cough for several months.  She has been experiencing a chronic dry cough for several months, with fluctuating severity and no specific pattern throughout the day. There is no history of asthma, lung disease, heartburn, or reflux, and she has not used inhalers for this condition. Her past medical history includes a previous episode of pneumonia on the right side in 2024, with no specific explanation provided for the pneumonia. She reports no difficulty swallowing.  About five or six years ago, an ENT specialist suspected acid reflux but did not initiate any treatment. She occasionally feels as though something is stuck in her throat, which she associates with the cough.  Her medication history includes changes to her blood pressure medication and discontinuation of a statin, which previously resolved her cough. She currently does not take any medication for reflux and has not experienced heartburn. She experiences postnasal drainage at certain times of the year but not currently. She uses a CPAP machine and has an upcoming appointment with a pulmonary specialist.    Records Reviewed:  Pulm visit 05/17/23 for OSA  HST 10/26/17 >> AHI 17.4, SaO2 low 79% Auto CPAP  04/15/19 to 05/14/19 >> used on 19 of 30 nights with average 5 hrs 33 min.  Average AHI 0.8 with median CPAP 7 and 95 th percentile CPAP 10 cm H2O   05/17/2023 Follow up ; OSA and AR  Patient presents for a 69-month follow-up.  Patient has underlying sleep apnea.  She is on CPAP at bedtime.  Recently got a new CPAP machine and says it is working well.  She is wearing it every single night.  Does feel that she is more rested and has increased energy.  Does not feel as tired.  CPAP download shows excellent compliance with daily average usage at 6 hours.  She is on auto CPAP 4 to 20 cm H2O.  AHI 0.5/hour.  Daily average pressure at 10.1 cm H2O.    Says overall allergy symptoms have been doing well   Patient was treated for pneumonia couple months ago.  Says that she has improved.  Follow-up chest x-ray showed improvement   Participates in the lung cancer CT screening program.  However insurance is no longer covering yearly CT scans.   Past Medical History:  Diagnosis Date   Atrophic vaginitis 11/18/2012   Back pain    Chicken pox as a child   Cough 07/14/2016   Dermatitis 04/26/2011   GERD (gastroesophageal reflux disease) 03/20/2017   High cholesterol    HTN (hypertension) 04/13/2010   Qualifier: Diagnosis of  By: Domenica MD, Stacey     Hypertension    Measles as a child   Miscarriage 1981   Onychomycosis 04/26/2011   OSA (obstructive sleep apnea)  10/30/2017   Osteopenia 10/17/1999   Osteopenia 04/13/2010   Qualifier: Diagnosis of  By: Domenica MD, Stacey     Preventative health care 07/14/2016   Screening for malignant neoplasm of the cervix 04/26/2011   Sleep apnea    Tinea pedis 05/10/2014   Urinary frequency 03/20/2017    Past Surgical History:  Procedure Laterality Date   cataracts Bilateral 2023   DILATION AND CURETTAGE OF UTERUS  10/17/1979   miscarriage    Family History  Problem Relation Age of Onset   Osteoporosis Mother    Coronary artery disease Mother    Heart attack Mother 41    Hypertension Mother    Dementia Mother    Hyperlipidemia Mother    Stroke Mother    Obesity Mother    Diabetes Father        type 2   Heart disease Father    Stroke Father    Obesity Father    Dementia Brother        Lewy Body   Parkinson's disease Brother    Birth defects Maternal Aunt    Dementia Maternal Grandmother    Asthma Maternal Grandfather    Cancer Paternal Grandmother        ovarian   Dementia Paternal Grandmother    Diabetes Paternal Grandfather        tyoe 2   Hearing loss Paternal Grandfather    Arthritis Other    Hyperlipidemia Other    Hypertension Other    Other Other        Cardiovascular disorder    Social History:  reports that she quit smoking about 12 years ago. Her smoking use included cigarettes. She started smoking about 57 years ago. She has a 45 pack-year smoking history. She has never used smokeless tobacco. She reports that she does not drink alcohol and does not use drugs.  Allergies:  Allergies  Allergen Reactions   Codeine    Pneumococcal Vaccine Polyvalent     REACTION: rash at injection site and induration.   Red Yeast Rice Extract     Leg pain    Medications: I have reviewed the patient's current medications.  The PMH, PSH, Medications, Allergies, and SH were reviewed and updated.  ROS: Constitutional: Negative for fever, weight loss and weight gain. Cardiovascular: Negative for chest pain and dyspnea on exertion. Respiratory: Is not experiencing shortness of breath at rest. Gastrointestinal: Negative for nausea and vomiting. Neurological: Negative for headaches. Psychiatric: The patient is not nervous/anxious  Blood pressure 131/74, pulse 61, temperature 97.8 F (36.6 C), SpO2 90%. There is no height or weight on file to calculate BMI.  PHYSICAL EXAM:  Exam: General: Well-developed, well-nourished Respiratory Respiratory effort: Equal inspiration and expiration without stridor Cardiovascular Peripheral Vascular: Warm  extremities with equal color/perfusion Eyes: No nystagmus with equal extraocular motion bilaterally Neuro/Psych/Balance: Patient oriented to person, place, and time; Appropriate mood and affect; Gait is intact with no imbalance; Cranial nerves I-XII are intact Head and Face Inspection: Normocephalic and atraumatic without mass or lesion Palpation: Facial skeleton intact without bony stepoffs Salivary Glands: No mass or tenderness Facial Strength: Facial motility symmetric and full bilaterally ENT Pinna: External ear intact and fully developed External canal: Canal is patent with intact skin Tympanic Membrane: Clear and mobile External Nose: No scar or anatomic deformity Internal Nose: Septum is deviated to the left. No polyp, or purulence. Mucosal edema and erythema present.  Bilateral inferior turbinate hypertrophy.  Lips, Teeth, and gums: Mucosa and teeth  intact and viable Oral cavity/oropharynx: No erythema or exudate, no lesions present Neck Neck and Trachea: Midline trachea without mass or lesion Thyroid : No mass or nodularity   Studies Reviewed: CXR 06/07/23 FINDINGS: Cardiac silhouette is unremarkable. No pneumothorax or pleural effusion. Right lower lobe pneumonia has nearly resolved. Small residual opacity identified right mid lung. Left base subsegmental atelectasis or scarring. Calcified aorta. Normal pulmonary vasculature. The visualized skeletal structures are unremarkable.   IMPRESSION: Right mid lung pneumonia has nearly completely resolved.  CXR 04/28/24 FINDINGS: The heart size and mediastinal contours are within normal limits. There is stable linear atelectasis or scarring in the right mid lung and left base. No new focal lung infiltrate, pleural effusion or pneumothorax. The visualized skeletal structures are unremarkable.   IMPRESSION: No active cardiopulmonary disease.  Assessment/Plan: Encounter Diagnoses  Name Primary?   Chronic cough Yes   Chronic  GERD    Environmental and seasonal allergies    Hypertrophy of both inferior nasal turbinates    Nasal septal deviation    Assessment and Plan Assessment & Plan Chronic cough Chronic dry cough for several months. Previously had cough which resolved after changes in her BP medication and statin. Silent reflux considered as a potential cause. Hx of R sided PNA in 2024, denies dysphagia sx. She denies post-nasal drainage currently.  I discussed common etiologies of chronic cough including GERD/allergies and PND and lung disease with the patient and explained that in most cases the cause of chronic cough is multi-factorial - Recommend Reflux Gourmet after meals, preferably at night. - Provide written instructions on lifestyle modifications for reflux control. - Order chest x-ray to evaluate (no recent imaging) - Coordinate with pulmonary specialist during upcoming visit to be evaluated for pulmonary causes of chronic cough and have PFTs  CXR and RTC in 3 mo  Update 07/24/24 Chronic cough due to gastroesophageal reflux disease (GERD) Chronic cough likely related to GERD with improvement from dietary changes. Vinegar identified as a trigger for cough. CXR nornal - Continue using Gourmet Reflux after meals. - Avoid vinegar in salad dressing. - Identify and avoid other dietary triggers.  Thank you for allowing me to participate in the care of this patient. Please do not hesitate to contact me with any questions or concerns.   Elena Larry, MD Otolaryngology [facility] Phone: [phone] Fax: [phone]    07/24/2024, 1:15 PM

## 2024-08-11 NOTE — Progress Notes (Signed)
 Mandy [name]                                          MRN: [id number]   08/11/2024   The [facility] Quality Team Specialist reviewed this patient medical record for the purposes of chart review for care gap closure. The following were reviewed: abstraction for care gap closure-controlling blood pressure.    [facility] Quality Team

## 2024-08-18 ENCOUNTER — Encounter: Payer: Self-pay | Admitting: Radiology

## 2024-09-22 DIAGNOSIS — Z85828 Personal history of other malignant neoplasm of skin: Secondary | ICD-10-CM | POA: Diagnosis not present

## 2024-09-22 DIAGNOSIS — L570 Actinic keratosis: Secondary | ICD-10-CM | POA: Diagnosis not present

## 2024-09-22 DIAGNOSIS — L718 Other rosacea: Secondary | ICD-10-CM | POA: Diagnosis not present

## 2024-09-22 DIAGNOSIS — L853 Xerosis cutis: Secondary | ICD-10-CM | POA: Diagnosis not present

## 2024-09-23 DIAGNOSIS — G4730 Sleep apnea, unspecified: Secondary | ICD-10-CM | POA: Diagnosis not present

## 2024-09-23 DIAGNOSIS — E669 Obesity, unspecified: Secondary | ICD-10-CM | POA: Diagnosis not present

## 2024-09-23 DIAGNOSIS — K5909 Other constipation: Secondary | ICD-10-CM | POA: Diagnosis not present

## 2024-09-23 DIAGNOSIS — E785 Hyperlipidemia, unspecified: Secondary | ICD-10-CM | POA: Diagnosis not present

## 2024-09-23 DIAGNOSIS — G4700 Insomnia, unspecified: Secondary | ICD-10-CM | POA: Diagnosis not present

## 2024-09-23 DIAGNOSIS — I10 Essential (primary) hypertension: Secondary | ICD-10-CM | POA: Diagnosis not present

## 2024-09-26 ENCOUNTER — Encounter: Payer: Self-pay | Admitting: Family Medicine

## 2024-09-26 DIAGNOSIS — I10 Essential (primary) hypertension: Secondary | ICD-10-CM

## 2024-09-26 DIAGNOSIS — R739 Hyperglycemia, unspecified: Secondary | ICD-10-CM

## 2024-09-26 DIAGNOSIS — E559 Vitamin D deficiency, unspecified: Secondary | ICD-10-CM

## 2024-09-26 DIAGNOSIS — K219 Gastro-esophageal reflux disease without esophagitis: Secondary | ICD-10-CM

## 2024-09-26 DIAGNOSIS — E782 Mixed hyperlipidemia: Secondary | ICD-10-CM

## 2024-09-29 NOTE — Telephone Encounter (Signed)
 Patient stated that she would like to call back to schedule lab appointment and lab orders was placed for future.

## 2024-10-07 ENCOUNTER — Other Ambulatory Visit

## 2024-10-07 DIAGNOSIS — K219 Gastro-esophageal reflux disease without esophagitis: Secondary | ICD-10-CM

## 2024-10-07 DIAGNOSIS — I10 Essential (primary) hypertension: Secondary | ICD-10-CM

## 2024-10-07 DIAGNOSIS — E559 Vitamin D deficiency, unspecified: Secondary | ICD-10-CM

## 2024-10-07 DIAGNOSIS — R739 Hyperglycemia, unspecified: Secondary | ICD-10-CM | POA: Diagnosis not present

## 2024-10-07 DIAGNOSIS — E782 Mixed hyperlipidemia: Secondary | ICD-10-CM | POA: Diagnosis not present

## 2024-10-07 LAB — CBC WITH DIFFERENTIAL/PLATELET
Basophils Absolute: 0 K/uL (ref 0.0–0.1)
Basophils Relative: 1 % (ref 0.0–3.0)
Eosinophils Absolute: 0.2 K/uL (ref 0.0–0.7)
Eosinophils Relative: 4.3 % (ref 0.0–5.0)
HCT: 41 % (ref 36.0–46.0)
Hemoglobin: 13.8 g/dL (ref 12.0–15.0)
Lymphocytes Relative: 25.5 % (ref 12.0–46.0)
Lymphs Abs: 1.2 K/uL (ref 0.7–4.0)
MCHC: 33.6 g/dL (ref 30.0–36.0)
MCV: 96.5 fl (ref 78.0–100.0)
Monocytes Absolute: 0.5 K/uL (ref 0.1–1.0)
Monocytes Relative: 10.1 % (ref 3.0–12.0)
Neutro Abs: 2.7 K/uL (ref 1.4–7.7)
Neutrophils Relative %: 59.1 % (ref 43.0–77.0)
Platelets: 345 K/uL (ref 150.0–400.0)
RBC: 4.25 Mil/uL (ref 3.87–5.11)
RDW: 14.3 % (ref 11.5–15.5)
WBC: 4.6 K/uL (ref 4.0–10.5)

## 2024-10-07 LAB — HEMOGLOBIN A1C: Hgb A1c MFr Bld: 5.5 % (ref 4.6–6.5)

## 2024-10-07 LAB — LIPID PANEL
Cholesterol: 175 mg/dL (ref 28–200)
HDL: 55 mg/dL
LDL Cholesterol: 105 mg/dL — ABNORMAL HIGH (ref 10–99)
NonHDL: 119.91
Total CHOL/HDL Ratio: 3
Triglycerides: 77 mg/dL (ref 10.0–149.0)
VLDL: 15.4 mg/dL (ref 0.0–40.0)

## 2024-10-07 LAB — COMPREHENSIVE METABOLIC PANEL WITH GFR
ALT: 15 U/L (ref 3–35)
AST: 18 U/L (ref 5–37)
Albumin: 3.8 g/dL (ref 3.5–5.2)
Alkaline Phosphatase: 56 U/L (ref 39–117)
BUN: 24 mg/dL — ABNORMAL HIGH (ref 6–23)
CO2: 31 meq/L (ref 19–32)
Calcium: 8.8 mg/dL (ref 8.4–10.5)
Chloride: 99 meq/L (ref 96–112)
Creatinine, Ser: 0.82 mg/dL (ref 0.40–1.20)
GFR: 67.87 mL/min
Glucose, Bld: 93 mg/dL (ref 70–99)
Potassium: 4.2 meq/L (ref 3.5–5.1)
Sodium: 136 meq/L (ref 135–145)
Total Bilirubin: 0.7 mg/dL (ref 0.2–1.2)
Total Protein: 6 g/dL (ref 6.0–8.3)

## 2024-10-07 LAB — TSH: TSH: 3.34 u[IU]/mL (ref 0.35–5.50)

## 2024-10-07 LAB — VITAMIN D 25 HYDROXY (VIT D DEFICIENCY, FRACTURES): VITD: 54.42 ng/mL (ref 30.00–100.00)

## 2024-10-07 LAB — HIGH SENSITIVITY CRP: CRP, High Sensitivity: 8.87 mg/L — ABNORMAL HIGH (ref 0.200–5.000)

## 2024-10-08 LAB — HOMOCYSTEINE: Homocysteine: 7.7 umol/L

## 2024-10-10 ENCOUNTER — Ambulatory Visit: Payer: Self-pay | Admitting: Family Medicine

## 2024-10-10 NOTE — Progress Notes (Signed)
 Patient reviewed via MyChart.

## 2024-10-14 ENCOUNTER — Other Ambulatory Visit: Payer: Self-pay | Admitting: Medical

## 2024-10-30 ENCOUNTER — Other Ambulatory Visit: Payer: Self-pay | Admitting: Family Medicine

## 2024-11-10 ENCOUNTER — Encounter: Payer: Self-pay | Admitting: Family Medicine

## 2024-11-10 DIAGNOSIS — R739 Hyperglycemia, unspecified: Secondary | ICD-10-CM

## 2024-11-10 DIAGNOSIS — I10 Essential (primary) hypertension: Secondary | ICD-10-CM

## 2024-11-10 DIAGNOSIS — E559 Vitamin D deficiency, unspecified: Secondary | ICD-10-CM

## 2024-11-10 DIAGNOSIS — E782 Mixed hyperlipidemia: Secondary | ICD-10-CM

## 2024-11-12 NOTE — Telephone Encounter (Signed)
 Spoke w/ Jessica Yacopino,DNP and she approved for pt to come in to see her for a physical in February. She was scheduled on 11/21/2024 @ 1:00 PM.Also pt wanted labs ordered and checked before appointment and pt was scheduled and labs order was placed for future.

## 2024-11-17 ENCOUNTER — Other Ambulatory Visit

## 2024-11-19 ENCOUNTER — Other Ambulatory Visit

## 2024-11-19 DIAGNOSIS — E559 Vitamin D deficiency, unspecified: Secondary | ICD-10-CM

## 2024-11-19 DIAGNOSIS — I10 Essential (primary) hypertension: Secondary | ICD-10-CM

## 2024-11-19 DIAGNOSIS — E782 Mixed hyperlipidemia: Secondary | ICD-10-CM

## 2024-11-19 LAB — LIPID PANEL
Cholesterol: 188 mg/dL (ref 28–200)
HDL: 67.2 mg/dL
LDL Cholesterol: 111 mg/dL — ABNORMAL HIGH (ref 10–99)
NonHDL: 120.86
Total CHOL/HDL Ratio: 3
Triglycerides: 51 mg/dL (ref 10.0–149.0)
VLDL: 10.2 mg/dL (ref 0.0–40.0)

## 2024-11-19 LAB — CBC WITH DIFFERENTIAL/PLATELET
Basophils Absolute: 0.1 10*3/uL (ref 0.0–0.1)
Basophils Relative: 1.5 % (ref 0.0–3.0)
Eosinophils Absolute: 0.1 10*3/uL (ref 0.0–0.7)
Eosinophils Relative: 2.9 % (ref 0.0–5.0)
HCT: 41.3 % (ref 36.0–46.0)
Hemoglobin: 13.6 g/dL (ref 12.0–15.0)
Lymphocytes Relative: 25.7 % (ref 12.0–46.0)
Lymphs Abs: 1.3 10*3/uL (ref 0.7–4.0)
MCHC: 33 g/dL (ref 30.0–36.0)
MCV: 97.1 fl (ref 78.0–100.0)
Monocytes Absolute: 0.5 10*3/uL (ref 0.1–1.0)
Monocytes Relative: 10.6 % (ref 3.0–12.0)
Neutro Abs: 2.9 10*3/uL (ref 1.4–7.7)
Neutrophils Relative %: 59.3 % (ref 43.0–77.0)
Platelets: 328 10*3/uL (ref 150.0–400.0)
RBC: 4.25 Mil/uL (ref 3.87–5.11)
RDW: 13.9 % (ref 11.5–15.5)
WBC: 4.9 10*3/uL (ref 4.0–10.5)

## 2024-11-19 LAB — COMPREHENSIVE METABOLIC PANEL WITH GFR
ALT: 19 U/L (ref 3–35)
AST: 23 U/L (ref 5–37)
Albumin: 3.8 g/dL (ref 3.5–5.2)
Alkaline Phosphatase: 54 U/L (ref 39–117)
BUN: 19 mg/dL (ref 6–23)
CO2: 30 meq/L (ref 19–32)
Calcium: 9 mg/dL (ref 8.4–10.5)
Chloride: 97 meq/L (ref 96–112)
Creatinine, Ser: 0.82 mg/dL (ref 0.40–1.20)
GFR: 67.81 mL/min
Glucose, Bld: 93 mg/dL (ref 70–99)
Potassium: 4.6 meq/L (ref 3.5–5.1)
Sodium: 134 meq/L — ABNORMAL LOW (ref 135–145)
Total Bilirubin: 0.6 mg/dL (ref 0.2–1.2)
Total Protein: 6.1 g/dL (ref 6.0–8.3)

## 2024-11-19 LAB — VITAMIN D 25 HYDROXY (VIT D DEFICIENCY, FRACTURES): VITD: 50.41 ng/mL (ref 30.00–100.00)

## 2024-11-19 LAB — TSH: TSH: 2.29 u[IU]/mL (ref 0.35–5.50)

## 2024-11-20 ENCOUNTER — Ambulatory Visit: Payer: Self-pay | Admitting: Family Medicine

## 2024-11-21 ENCOUNTER — Encounter: Payer: Self-pay | Admitting: Student

## 2024-11-21 ENCOUNTER — Ambulatory Visit: Admitting: Student

## 2024-11-21 VITALS — BP 138/76 | HR 45 | Temp 98.0°F | Resp 16 | Ht 62.0 in | Wt 193.4 lb

## 2024-11-21 DIAGNOSIS — Z0000 Encounter for general adult medical examination without abnormal findings: Secondary | ICD-10-CM | POA: Insufficient documentation

## 2024-11-21 DIAGNOSIS — I10 Essential (primary) hypertension: Secondary | ICD-10-CM

## 2024-11-21 DIAGNOSIS — K219 Gastro-esophageal reflux disease without esophagitis: Secondary | ICD-10-CM

## 2024-11-21 DIAGNOSIS — G4733 Obstructive sleep apnea (adult) (pediatric): Secondary | ICD-10-CM

## 2024-11-21 DIAGNOSIS — R7982 Elevated C-reactive protein (CRP): Secondary | ICD-10-CM

## 2024-11-21 DIAGNOSIS — H6123 Impacted cerumen, bilateral: Secondary | ICD-10-CM | POA: Insufficient documentation

## 2024-11-21 DIAGNOSIS — E782 Mixed hyperlipidemia: Secondary | ICD-10-CM

## 2024-11-21 DIAGNOSIS — Z87891 Personal history of nicotine dependence: Secondary | ICD-10-CM

## 2024-11-21 DIAGNOSIS — M8580 Other specified disorders of bone density and structure, unspecified site: Secondary | ICD-10-CM

## 2024-11-21 DIAGNOSIS — M25561 Pain in right knee: Secondary | ICD-10-CM

## 2024-11-21 DIAGNOSIS — Z6832 Body mass index (BMI) 32.0-32.9, adult: Secondary | ICD-10-CM

## 2024-11-21 DIAGNOSIS — E559 Vitamin D deficiency, unspecified: Secondary | ICD-10-CM

## 2024-11-21 MED ORDER — VITAMIN D3 50 MCG (2000 UT) PO CAPS: 4000.0000 [IU] | ORAL_CAPSULE | Freq: Every day | ORAL | Status: AC

## 2024-11-21 NOTE — Assessment & Plan Note (Signed)
 Managed with Zetia . Encourage heart healthy diet such as MIND or DASH diet, increase exercise, avoid trans fats, simple carbohydrates and processed foods, consider a krill or fish or flaxseed oil cap dai

## 2024-11-21 NOTE — Assessment & Plan Note (Signed)
 Supplement and monitor

## 2024-11-21 NOTE — Progress Notes (Signed)
 "  Subjective:     Patient ID: Mandy [name], female    DOB: 07-02-1946, 79 y.o.   MRN: [id number]  Chief Complaint  Patient presents with   Annual Exam    Patient is here for a physical with the provider    HPI  Discussed the use of AI scribe software for clinical note transcription with the patient, who gave verbal consent to proceed. Adult children, 2 grandchildren  History of Present Illness          Mandy [name] is a 79 year old female who presents for a routine follow-up and physical exam. She is accompanied by her husband, Medford.  She uses CPAP nightly for sleep apnea and follows with pulmonology.  She has chronic right knee pain from a prior meniscus tear and receives periodic injections. She had prior hand surgery for a thumb problem.  She has osteoporosis and uses vitamin D , calcium , and strength training. She notes a family history of osteoporosis.  She has silent reflux and uses Reflux Gourmet twice daily with improved cough.  She quit smoking 30 years ago. She has no current vision or hearing complaints and sees ENT for recurrent ear wax.  She monitors blood pressure at home, which averages around 120/60, and notes readings are usually higher in clinic   Following: Derm, Pulmonary, Ortho   Patient denies fever, chills, SOB, CP, palpitations, dyspnea, edema, HA, vision changes, N/V/D, abdominal pain, urinary symptoms, rash, weight changes, and recent illness or hospitalizations.   Health Maintenance Due  Topic Date Due   Medicare Annual Wellness (AWV)  02/06/2019    Past Medical History:  Diagnosis Date   Atrophic vaginitis 11/18/2012   Back pain    Chicken pox as a child   Cough 07/14/2016   Dermatitis 04/26/2011   GERD (gastroesophageal reflux disease) 03/20/2017   High cholesterol    History of tobacco abuse 12/08/2021   HTN (hypertension) 04/13/2010   Qualifier: Diagnosis of  By: Domenica MD, Stacey     Hypertension    Measles as a child    Miscarriage 1981   Onychomycosis 04/26/2011   OSA (obstructive sleep apnea) 10/30/2017   Osteopenia 10/17/1999   Osteopenia 04/13/2010   Qualifier: Diagnosis of  By: Domenica MD, Stacey     Preventative health care 07/14/2016   Screening for malignant neoplasm of the cervix 04/26/2011   Sleep apnea    Tinea pedis 05/10/2014   Urinary frequency 03/20/2017    Past Surgical History:  Procedure Laterality Date   cataracts Bilateral 2023   DILATION AND CURETTAGE OF UTERUS  10/17/1979   miscarriage    Family History  Problem Relation Age of Onset   Osteoporosis Mother    Coronary artery disease Mother    Heart attack Mother 31   Hypertension Mother    Dementia Mother    Hyperlipidemia Mother    Stroke Mother    Obesity Mother    Diabetes Father        type 2   Heart disease Father    Stroke Father    Obesity Father    Dementia Brother        Lewy Body   Parkinson's disease Brother    Birth defects Maternal Aunt    Dementia Maternal Grandmother    Asthma Maternal Grandfather    Cancer Paternal Grandmother        ovarian   Dementia Paternal Grandmother    Diabetes Paternal Actor  tyoe 2   Hearing loss Paternal Grandfather    Arthritis Other    Hyperlipidemia Other    Hypertension Other    Other Other        Cardiovascular disorder    Social History   Socioeconomic History   Marital status: Married    Spouse name: Medford   Number of children: Not on file   Years of education: Not on file   Highest education level: Bachelor's degree (e.g., BA, AB, BS)  Occupational History   Occupation: bookkeeper  Tobacco Use   Smoking status: Former    Current packs/day: 0.00    Average packs/day: 1 pack/day for 45.0 years (45.0 ttl pk-yrs)    Types: Cigarettes    Start date: 65    Quit date: 2013    Years since quitting: 13.1   Smokeless tobacco: Never  Vaping Use   Vaping status: Never Used  Substance and Sexual Activity   Alcohol use: No   Drug use: No    Sexual activity: Yes    Partners: Male  Other Topics Concern   Not on file  Social History Narrative   Regular exercise- yes   Does Yoga 3-4 X week, personal trainer 1 X week   and walks regularly   Has lost 16 pounds by minimizing processed foods and meats   Social Drivers of Health   Tobacco Use: Medium Risk (11/21/2024)   Patient History    Smoking Tobacco Use: Former    Smokeless Tobacco Use: Never    Passive Exposure: Not on Actuary Strain: Low Risk (04/12/2023)   Overall Financial Resource Strain (CARDIA)    Difficulty of Paying Living Expenses: Not hard at all  Food Insecurity: No Food Insecurity (04/12/2023)   Hunger Vital Sign    Worried About Running Out of Food in the Last Year: Never true    Ran Out of Food in the Last Year: Never true  Transportation Needs: No Transportation Needs (04/12/2023)   PRAPARE - Administrator, Civil Service (Medical): No    Lack of Transportation (Non-Medical): No  Physical Activity: Sufficiently Active (04/12/2023)   Exercise Vital Sign    Days of Exercise per Week: 5 days    Minutes of Exercise per Session: 30 min  Stress: No Stress Concern Present (04/12/2023)   [employer] of Occupational Health - Occupational Stress Questionnaire    Feeling of Stress : Not at all  Social Connections: Socially Integrated (04/12/2023)   Social Connection and Isolation Panel    Frequency of Communication with Friends and Family: Three times a week    Frequency of Social Gatherings with Friends and Family: Twice a week    Attends Religious Services: 1 to 4 times per year    Active Member of [employer] or Organizations: Yes    Attends Banker Meetings: 1 to 4 times per year    Marital Status: Married  Catering Manager Violence: Not on file  Depression (PHQ2-9): Low Risk (11/15/2023)   Depression (PHQ2-9)    PHQ-2 Score: 0  Alcohol Screen: Not on file  Housing: Low Risk (04/12/2023)   Housing    Last Housing  Risk Score: 0  Utilities: Not At Risk (12/20/2022)   [facility] Utilities    Threatened with loss of utilities: No  Health Literacy: Not on file    Outpatient Medications Prior to Visit  Medication Sig Dispense Refill   b complex vitamins tablet Take 1 tablet by mouth daily.  CRANBERRY CONCENTRATE PO Take 1 tablet by mouth in the morning and at bedtime.     Docusate Sodium (COLACE PO) Take 1 tablet by mouth in the morning and at bedtime.     ezetimibe  (ZETIA ) 10 MG tablet Take 1 tablet (10 mg total) by mouth daily. 90 tablet 1   mupirocin  ointment (BACTROBAN ) 2 % APPLY INTO THE NOSE AT BEDTIME AS NEEDED. 22 g 1   Nutritional Supplements (VITAMIN D  BOOSTER PO) Take by mouth.     Probiotic Product (PROBIOTIC DAILY PO) Take by mouth daily.     triamterene -hydrochlorothiazide (DYAZIDE) 37.5-25 MG capsule Take 1 each (1 capsule total) by mouth daily. 90 capsule 1   calcium  citrate-vitamin D  (CITRACAL+D) 315-200 MG-UNIT per tablet Take 1 tablet by mouth 2 (two) times daily. (Patient not taking: Reported on 05/15/2024)     multivitamin (THERAGRAN) per tablet Take 1 tablet by mouth daily. (Patient not taking: Reported on 05/15/2024)     Omega-3 Fatty Acids-Vitamin E (COROMEGA PO) Take by mouth. (Patient not taking: Reported on 05/15/2024)     TURMERIC PO Take 1 capsule by mouth daily.      vitamin C (ASCORBIC ACID) 500 MG tablet Take 500 mg by mouth daily. (Patient not taking: Reported on 05/15/2024)     No facility-administered medications prior to visit.    Allergies[1]  ROS    See HPI Objective:    Physical Exam Constitutional:      General: She is not in acute distress.    Appearance: She is not ill-appearing, toxic-appearing or diaphoretic.  HENT:     Head: Normocephalic and atraumatic.     Right Ear: Ear canal and external ear normal. There is impacted cerumen.     Left Ear: Ear canal and external ear normal. There is impacted cerumen.     Nose: Nose normal. No congestion.      Mouth/Throat:     Mouth: Mucous membranes are moist.     Pharynx: Oropharynx is clear.  Eyes:     Extraocular Movements: Extraocular movements intact.     Right eye: Normal extraocular motion.     Left eye: Normal extraocular motion.     Conjunctiva/sclera: Conjunctivae normal.     Pupils: Pupils are equal, round, and reactive to light.  Neck:     Thyroid : No thyroid  mass or thyromegaly.     Vascular: No carotid bruit or JVD.  Cardiovascular:     Rate and Rhythm: Normal rate and regular rhythm.     Pulses: Normal pulses.     Heart sounds: Normal heart sounds, S1 normal and S2 normal. No murmur heard.    No friction rub. No gallop.  Pulmonary:     Effort: Pulmonary effort is normal. No respiratory distress.     Breath sounds: Normal breath sounds.  Abdominal:     General: Bowel sounds are normal. There is no distension.     Palpations: Abdomen is soft.     Tenderness: There is no abdominal tenderness. There is no guarding.  Musculoskeletal:        General: Normal range of motion.     Cervical back: Full passive range of motion without pain and normal range of motion. No edema or erythema.     Right lower leg: No edema.     Left lower leg: No edema.  Lymphadenopathy:     Cervical: No cervical adenopathy.  Skin:    General: Skin is warm and dry.     Capillary Refill:  Capillary refill takes less than 2 seconds.  Neurological:     General: No focal deficit present.     Mental Status: She is alert and oriented to person, place, and time.     Cranial Nerves: No cranial nerve deficit.     Motor: No weakness.     Coordination: Coordination normal.     Gait: Gait normal.     Deep Tendon Reflexes: Reflexes normal.  Psychiatric:        Mood and Affect: Mood normal.        Behavior: Behavior normal.        Thought Content: Thought content normal.      BP (!) 146/86 (BP Location: Right Arm, Patient Position: Sitting, Cuff Size: Normal)   Pulse (!) 45   Temp 98 F (36.7 C)  (Oral)   Resp 16   Ht 5' 2 (1.575 m)   Wt 193 lb 6.4 oz (87.7 kg)   SpO2 97%   BMI 35.37 kg/m  Wt Readings from Last 3 Encounters:  11/21/24 193 lb 6.4 oz (87.7 kg)  05/15/24 186 lb (84.4 kg)  04/17/24 182 lb 6.4 oz (82.7 kg)       Assessment & Plan:   Problem List Items Addressed This Visit       Cardiovascular and Mediastinum   Essential hypertension   Well controlled at home. BP readings 120s/80s. no changes to meds. Encouraged heart healthy diet such as the DASH diet and exercise as tolerated.         Respiratory   OSA (obstructive sleep apnea)   Follows with Pulmonary. Uses CPAP nightly.        Digestive   GERD (gastroesophageal reflux disease)   Avoid offending foods, start probiotics. Do not eat large meals in late evening and consider raising head of bed. Manages with Reflux Gourmet Alginate prn.         Nervous and Auditory   Bilateral impacted cerumen     Musculoskeletal and Integument   Osteopenia   Encouraged to get adequate exercise, calcium  and vitamin d  intake  Last DEXA 04/2024, Repeat 04/2026        Other   Acute pain of right knee   Chronic. Follows with Ortho, received injections prn. Encouraged moist heat and gentle stretching as tolerated.  Voltaren gel prn      Annual physical exam - Primary   Patient encouraged to maintain heart healthy diet, regular exercise, adequate sleep. Consider daily probiotics. Take medications as prescribed.        Class 1 obesity with serious comorbidity and body mass index (BMI) of 32.0 to 32.9 in adult   Encouraged DASH or MIND diet, decrease po intake and increase exercise as tolerated. Needs 7-8 hours of sleep nightly. Avoid trans fats, eat small, frequent meals every 4-5 hours with lean proteins, complex carbs and healthy fats. Minimize simple carbs, high fat foods and processed foods        RESOLVED: History of tobacco abuse   Mixed hyperlipidemia   Managed with Zetia . Encourage heart healthy diet  such as MIND or DASH diet, increase exercise, avoid trans fats, simple carbohydrates and processed foods, consider a krill or fish or flaxseed oil cap dai       Vitamin D  deficiency   Supplement and monitor       Other Visit Diagnoses       Elevated C-reactive protein (CRP)       Relevant Orders   C-reactive protein  Procedure: B/L Ear irrigation performed by Tillman, CMA using warm wate. Cerumen was successfully cleared without complications, and the patient tolerated the procedure well.     I have discontinued Jaid P. Carmicheal's multivitamin, calcium  citrate-vitamin D , ascorbic acid, Omega-3 Fatty Acids-Vitamin E (COROMEGA PO), and TURMERIC PO. I am also having her start on Vitamin D3. Additionally, I am having her maintain her b complex vitamins, Probiotic Product (PROBIOTIC DAILY PO), Nutritional Supplements (VITAMIN D  BOOSTER PO), CRANBERRY CONCENTRATE PO, Docusate Sodium (COLACE PO), triamterene -hydrochlorothiazide, ezetimibe , and mupirocin  ointment.  Meds ordered this encounter  Medications   Cholecalciferol (VITAMIN D3) 50 MCG (2000 UT) capsule    Sig: Take 2 capsules (4,000 Units total) by mouth daily.    Supervising Provider:   DOMENICA BLACKBIRD A [[id number]]      [1]  Allergies Allergen Reactions   Codeine    Pneumococcal Vaccine Polyvalent     REACTION: rash at injection site and induration.   Red Yeast Rice Extract     Leg pain   "

## 2024-11-21 NOTE — Assessment & Plan Note (Signed)
 Avoid offending foods, start probiotics. Do not eat large meals in late evening and consider raising head of bed. Manages with Reflux Gourmet Alginate prn.

## 2024-11-21 NOTE — Assessment & Plan Note (Signed)
 Her BMI today is 46. Encouraged ongoing weight-loss efforts, as even modest reductions can significantly improve overall health. Encouraged DASH or MIND diet, decrease po intake and increase exercise as tolerated. Needs 7-8 hours of sleep nightly. Avoid trans fats, eat small, frequent meals every 4-5 hours with lean proteins, complex carbs and healthy fats. Minimize simple carbs, high fat foods and processed foods

## 2024-11-21 NOTE — Assessment & Plan Note (Signed)
 Well controlled at home. BP readings 120s/80s. no changes to meds. Encouraged heart healthy diet such as the DASH diet and exercise as tolerated.

## 2024-11-21 NOTE — Assessment & Plan Note (Addendum)
 Chronic. Follows with Ortho, received injections prn. Encouraged moist heat and gentle stretching as tolerated.  Voltaren gel prn

## 2024-11-21 NOTE — Assessment & Plan Note (Signed)
 Encouraged to get adequate exercise, calcium  and vitamin d  intake  Last DEXA 04/2024, Repeat 04/2026

## 2024-11-21 NOTE — Addendum Note (Signed)
 Addended by: TRUDY CURVIN RAMAN on: 11/21/2024 02:05 PM   Modules accepted: Orders

## 2024-11-21 NOTE — Assessment & Plan Note (Signed)
 Follows with Pulmonary. Uses CPAP nightly.

## 2024-11-21 NOTE — Assessment & Plan Note (Signed)
 Patient encouraged to maintain heart healthy diet, regular exercise, adequate sleep. Consider daily probiotics. Take medications as prescribed

## 2024-11-27 ENCOUNTER — Encounter: Admitting: Family Medicine

## 2025-03-25 ENCOUNTER — Encounter: Admitting: Student

## 2025-11-26 ENCOUNTER — Encounter: Admitting: Student
